# Patient Record
Sex: Male | Born: 1935
Health system: Southern US, Community
[De-identification: ages and names within clinical notes are randomized; demographics above are authoritative.]

## PROBLEM LIST (undated history)

## (undated) DIAGNOSIS — C4490 Unspecified malignant neoplasm of skin, unspecified: Secondary | ICD-10-CM

## (undated) DIAGNOSIS — N400 Enlarged prostate without lower urinary tract symptoms: Secondary | ICD-10-CM

## (undated) DIAGNOSIS — N289 Disorder of kidney and ureter, unspecified: Secondary | ICD-10-CM

## (undated) DIAGNOSIS — K219 Gastro-esophageal reflux disease without esophagitis: Secondary | ICD-10-CM

## (undated) DIAGNOSIS — M1990 Unspecified osteoarthritis, unspecified site: Secondary | ICD-10-CM

## (undated) DIAGNOSIS — N529 Male erectile dysfunction, unspecified: Secondary | ICD-10-CM

## (undated) DIAGNOSIS — C801 Malignant (primary) neoplasm, unspecified: Secondary | ICD-10-CM

## (undated) DIAGNOSIS — N189 Chronic kidney disease, unspecified: Secondary | ICD-10-CM

## (undated) DIAGNOSIS — R7301 Impaired fasting glucose: Secondary | ICD-10-CM

## (undated) DIAGNOSIS — Z8701 Personal history of pneumonia (recurrent): Secondary | ICD-10-CM

## (undated) DIAGNOSIS — T7840XA Allergy, unspecified, initial encounter: Secondary | ICD-10-CM

## (undated) DIAGNOSIS — C61 Malignant neoplasm of prostate: Secondary | ICD-10-CM

## (undated) HISTORY — DX: Gastro-esophageal reflux disease without esophagitis: K21.9

## (undated) HISTORY — PX: OTHER SURGICAL HISTORY: SHX169

## (undated) HISTORY — DX: Benign prostatic hyperplasia without lower urinary tract symptoms: N40.0

## (undated) HISTORY — DX: Impaired fasting glucose: R73.01

## (undated) HISTORY — DX: Unspecified malignant neoplasm of skin, unspecified: C44.90

## (undated) HISTORY — DX: Male erectile dysfunction, unspecified: N52.9

## (undated) HISTORY — DX: Disorder of kidney and ureter, unspecified: N28.9

## (undated) HISTORY — DX: Allergy, unspecified, initial encounter: T78.40XA

## (undated) HISTORY — DX: Chronic kidney disease, unspecified: N18.9

## (undated) HISTORY — DX: Personal history of pneumonia (recurrent): Z87.01

## (undated) HISTORY — DX: Malignant neoplasm of prostate: C61

## (undated) HISTORY — DX: Unspecified osteoarthritis, unspecified site: M19.90

---

## 2005-08-31 ENCOUNTER — Ambulatory Visit (HOSPITAL_COMMUNITY): Admission: RE | Admit: 2005-08-31 | Discharge: 2005-08-31 | Payer: Self-pay | Admitting: Urology

## 2005-09-29 ENCOUNTER — Ambulatory Visit: Admission: RE | Admit: 2005-09-29 | Discharge: 2005-12-28 | Payer: Self-pay | Admitting: Radiation Oncology

## 2005-12-07 ENCOUNTER — Encounter: Admission: RE | Admit: 2005-12-07 | Discharge: 2005-12-07 | Payer: Self-pay | Admitting: Urology

## 2005-12-14 ENCOUNTER — Ambulatory Visit (HOSPITAL_BASED_OUTPATIENT_CLINIC_OR_DEPARTMENT_OTHER): Admission: RE | Admit: 2005-12-14 | Discharge: 2005-12-14 | Payer: Self-pay | Admitting: Urology

## 2005-12-31 ENCOUNTER — Ambulatory Visit: Admission: RE | Admit: 2005-12-31 | Discharge: 2006-02-24 | Payer: Self-pay | Admitting: Radiation Oncology

## 2009-05-16 ENCOUNTER — Ambulatory Visit: Payer: Self-pay | Admitting: Urology

## 2010-10-22 ENCOUNTER — Ambulatory Visit: Payer: Self-pay | Admitting: Urology

## 2010-10-22 ENCOUNTER — Ambulatory Visit (INDEPENDENT_AMBULATORY_CARE_PROVIDER_SITE_OTHER): Payer: Medicare Other | Admitting: Urology

## 2010-10-22 DIAGNOSIS — C61 Malignant neoplasm of prostate: Secondary | ICD-10-CM

## 2010-10-22 DIAGNOSIS — N400 Enlarged prostate without lower urinary tract symptoms: Secondary | ICD-10-CM

## 2011-04-21 ENCOUNTER — Ambulatory Visit (INDEPENDENT_AMBULATORY_CARE_PROVIDER_SITE_OTHER): Payer: Medicare Other | Admitting: Urology

## 2011-04-21 DIAGNOSIS — C61 Malignant neoplasm of prostate: Secondary | ICD-10-CM

## 2011-05-29 ENCOUNTER — Other Ambulatory Visit: Payer: Self-pay | Admitting: Urology

## 2011-05-29 DIAGNOSIS — C61 Malignant neoplasm of prostate: Secondary | ICD-10-CM

## 2011-06-04 ENCOUNTER — Encounter (HOSPITAL_COMMUNITY): Payer: Self-pay

## 2011-06-04 ENCOUNTER — Ambulatory Visit (HOSPITAL_COMMUNITY)
Admission: RE | Admit: 2011-06-04 | Discharge: 2011-06-04 | Disposition: A | Discharge status: Home / Self Care | Payer: Medicare Other | Source: Ambulatory Visit | Attending: Urology | Admitting: Urology

## 2011-06-04 ENCOUNTER — Encounter (HOSPITAL_COMMUNITY)
Admission: RE | Admit: 2011-06-04 | Discharge: 2011-06-04 | Disposition: A | Discharge status: Home / Self Care | Payer: Medicare Other | Source: Ambulatory Visit | Attending: Urology | Admitting: Urology

## 2011-06-04 DIAGNOSIS — R930 Abnormal findings on diagnostic imaging of skull and head, not elsewhere classified: Secondary | ICD-10-CM | POA: Insufficient documentation

## 2011-06-04 DIAGNOSIS — C61 Malignant neoplasm of prostate: Secondary | ICD-10-CM | POA: Insufficient documentation

## 2011-06-04 DIAGNOSIS — Q619 Cystic kidney disease, unspecified: Secondary | ICD-10-CM | POA: Insufficient documentation

## 2011-06-04 HISTORY — DX: Malignant (primary) neoplasm, unspecified: C80.1

## 2011-06-04 MED ORDER — TECHNETIUM TC 99M MEDRONATE IV KIT
25.0000 | PACK | Freq: Once | INTRAVENOUS | Status: AC | PRN
  Administered 2011-06-04: 27 via INTRAVENOUS

## 2011-06-04 MED ORDER — IOHEXOL 300 MG/ML  SOLN
100.0000 mL | Freq: Once | INTRAMUSCULAR | Status: AC | PRN
  Administered 2011-06-04: 100 mL via INTRAVENOUS

## 2011-06-12 ENCOUNTER — Other Ambulatory Visit: Payer: Self-pay | Admitting: Urology

## 2011-06-12 ENCOUNTER — Other Ambulatory Visit (HOSPITAL_COMMUNITY): Payer: Medicare Other

## 2011-06-12 DIAGNOSIS — C61 Malignant neoplasm of prostate: Secondary | ICD-10-CM

## 2011-06-19 ENCOUNTER — Ambulatory Visit (HOSPITAL_COMMUNITY)
Admission: RE | Admit: 2011-06-19 | Discharge: 2011-06-19 | Disposition: A | Discharge status: Home / Self Care | Payer: Medicare Other | Source: Ambulatory Visit | Attending: Urology | Admitting: Urology

## 2011-06-19 DIAGNOSIS — M799 Soft tissue disorder, unspecified: Secondary | ICD-10-CM | POA: Insufficient documentation

## 2011-06-19 DIAGNOSIS — C61 Malignant neoplasm of prostate: Secondary | ICD-10-CM | POA: Diagnosis not present

## 2011-06-19 DIAGNOSIS — R930 Abnormal findings on diagnostic imaging of skull and head, not elsewhere classified: Secondary | ICD-10-CM | POA: Insufficient documentation

## 2011-06-24 ENCOUNTER — Other Ambulatory Visit: Payer: Self-pay | Admitting: Internal Medicine

## 2011-06-24 DIAGNOSIS — D329 Benign neoplasm of meninges, unspecified: Secondary | ICD-10-CM

## 2011-06-28 ENCOUNTER — Ambulatory Visit
Admission: RE | Admit: 2011-06-28 | Discharge: 2011-06-28 | Disposition: A | Discharge status: Home / Self Care | Payer: Medicare Other | Source: Ambulatory Visit | Attending: Internal Medicine | Admitting: Internal Medicine

## 2011-06-28 DIAGNOSIS — M799 Soft tissue disorder, unspecified: Secondary | ICD-10-CM | POA: Diagnosis not present

## 2011-06-28 DIAGNOSIS — Z8546 Personal history of malignant neoplasm of prostate: Secondary | ICD-10-CM | POA: Diagnosis not present

## 2011-06-28 DIAGNOSIS — D329 Benign neoplasm of meninges, unspecified: Secondary | ICD-10-CM

## 2011-06-28 DIAGNOSIS — C7952 Secondary malignant neoplasm of bone marrow: Secondary | ICD-10-CM | POA: Diagnosis not present

## 2011-07-01 ENCOUNTER — Encounter: Payer: Self-pay | Admitting: Radiation Oncology

## 2011-07-01 NOTE — Progress Notes (Signed)
78 year old male. Married to wife, Britta Mccreedy. 3 children, 2 daughters and 1 son. Grandchildren (one murdered) Retired Doctor, general practice.  Completed external beam radiation therapy 11/23/2005 4500 cGy 25 sessions for clinical stage T2b, intermediate risk adenocarcinoma of the prostate. Implanted  9000 cGy, isotope iodine 125 utilizing 57 seeds and 20 needles with a seed activity of 0.345 millicuries per seed on 12/14/2005.   Dr. Retta Diones ordered CT and bone scan because PSA continued to rise. Bone scan done 06/04/2011 which revealed abnormal uptake in the left skull. CT of head done 06/18/2010 revealed 1.5 cm left frontal extra-axial soft tissue mass , most consistent with a small meningioma. Then, 06/28/2011 MRI revealed high left frontal bone metastasis approximately 4 to 4.5 cm in diameter.  No indication of a pacemaker.  WU:[id number] HISTORY OF RADIATION THERAPY

## 2011-07-02 ENCOUNTER — Ambulatory Visit
Admission: RE | Admit: 2011-07-02 | Discharge: 2011-07-02 | Disposition: A | Discharge status: Home / Self Care | Payer: Medicare Other | Source: Ambulatory Visit | Attending: Radiation Oncology | Admitting: Radiation Oncology

## 2011-07-02 ENCOUNTER — Encounter: Payer: Self-pay | Admitting: Radiation Oncology

## 2011-07-02 DIAGNOSIS — Z87891 Personal history of nicotine dependence: Secondary | ICD-10-CM | POA: Diagnosis not present

## 2011-07-02 DIAGNOSIS — Z510 Encounter for antineoplastic radiation therapy: Secondary | ICD-10-CM | POA: Insufficient documentation

## 2011-07-02 DIAGNOSIS — K219 Gastro-esophageal reflux disease without esophagitis: Secondary | ICD-10-CM | POA: Diagnosis not present

## 2011-07-02 DIAGNOSIS — Z79899 Other long term (current) drug therapy: Secondary | ICD-10-CM | POA: Diagnosis not present

## 2011-07-02 DIAGNOSIS — C7952 Secondary malignant neoplasm of bone marrow: Secondary | ICD-10-CM | POA: Insufficient documentation

## 2011-07-02 DIAGNOSIS — Z809 Family history of malignant neoplasm, unspecified: Secondary | ICD-10-CM | POA: Insufficient documentation

## 2011-07-02 DIAGNOSIS — C7951 Secondary malignant neoplasm of bone: Secondary | ICD-10-CM | POA: Insufficient documentation

## 2011-07-02 DIAGNOSIS — C61 Malignant neoplasm of prostate: Secondary | ICD-10-CM | POA: Insufficient documentation

## 2011-07-02 DIAGNOSIS — N189 Chronic kidney disease, unspecified: Secondary | ICD-10-CM | POA: Insufficient documentation

## 2011-07-02 DIAGNOSIS — Z85828 Personal history of other malignant neoplasm of skin: Secondary | ICD-10-CM | POA: Diagnosis not present

## 2011-07-02 NOTE — Progress Notes (Signed)
Please see the Nurse Progress Note in the MD Initial Consult Encounter for this patient. 

## 2011-07-02 NOTE — Progress Notes (Addendum)
[facility] Radiation Oncology NEW PATIENT EVALUATION  Name: Eric Deleon MRN: [id number]  Date: 07/02/2011  DOB: 25-May-1932  Status: outpatient   CC: Ezequiel Kayser, MD, MD  Waynard Edwards Redge Gainer, MD    REFERRING PHYSICIAN: Ezequiel Kayser, MD   DIAGNOSIS: Metastatic carcinoma of prostate to left frontal bone    HISTORY OF PRESENT ILLNESS:  Eric Deleon is a 78 y.o. male who is seen today for the courtesy of Dr. Waynard Edwards for consideration of radiation therapy in the management of his metastatic carcinoma the prostate to the left frontal bone/calvarium. I first saw the patient in consultation in April 2007 when he presented with stage TII B. intermediate risk adenocarcinoma prostate. He was treated with external beam radiation therapy followed by seed implantation. Unfortunately, he presented have a rising PSA which was 1.15 in April 2009. But 2000 cGy to 6.5. His staging workup including a bone scan was negative. His post on Casodex in December 2000 (50 mg every other day) he had a fall his PSA in April 2012 his PSA return to 0.8. A repeat PSA at Dr. Laurey Morale office on 12/03/2010 was 8.24. He was seen by Dr. Retta Diones on 04/21/2011. His PSA in 04/21/2011 from Dr. Lenoria Chime office was 9.85.   PREVIOUS RADIATION THERAPY: Status post 4500 cGy in 25 sessions directed to his prostate completed 11/23/2005. Prostate seed implant to deliver a further 9000 cGy performed on 12/14/2005.   PAST MEDICAL HISTORY:  has a past medical history of Prostate cancer; GERD (gastroesophageal reflux disease); Gout; Cancer; Arthritis; History of pneumonia; Erectile dysfunction; CRI (chronic renal insufficiency); Skin cancer; BPH (benign prostatic hyperplasia); IFG (impaired fasting glucose); and Allergy.     PAST SURGICAL HISTORY:  Past Surgical History  Procedure Date  . History of radiation therapy   . Skin cancer excised     Multiple skin cancers excised from hands and back     FAMILY HISTORY: family  history includes COPD in his mother; Cancer in his brother and father; Heart attack in his father; and Heart disease in his mother.   SOCIAL HISTORY:  reports that he quit smoking about 51 years ago. His smoking use included Cigarettes. He has a 7.5 pack-year smoking history. He has never used smokeless tobacco. He reports that he drinks alcohol. He reports that he does not use illicit drugs.   ALLERGIES: Antihistamines, chlorpheniramine-type   MEDICATIONS:  Current Outpatient Prescriptions  Medication Sig Dispense Refill  . allopurinol (ZYLOPRIM) 100 MG tablet Take 100 mg by mouth daily.      . bicalutamide (CASODEX) 50 MG tablet Take 50 mg by mouth daily.      . diphenhydrAMINE (BENADRYL) 25 MG tablet Take 25 mg by mouth every 6 (six) hours as needed.      Marland Kitchen omeprazole (PRILOSEC) 20 MG capsule Take 20 mg by mouth daily.      . simethicone (MYLICON) 80 MG chewable tablet Chew 80 mg by mouth every 6 (six) hours as needed.      . Tamsulosin HCl (FLOMAX) 0.4 MG CAPS Take by mouth.      . Vitamin D, Cholecalciferol, 400 UNITS CHEW Chew 800 Units by mouth daily.      Marland Kitchen aspirin 81 MG tablet Take 81 mg by mouth daily.          REVIEW OF SYSTEMS:  Pertinent items are noted in HPI.    PHYSICAL EXAM:  height is 5\' 11"  (1.803 m) and weight is 234 lb 4.8 oz (106.278  kg). His oral temperature is 98.2 F (36.8 C). His blood pressure is 106/67 and his pulse is 93. His respiration is 20.   Head and neck examination: His calvarium is normal to palpation. No masses are appreciated. Remainder of his head and neck examination is grossly unremarkable. Neurologic examination: Grossly nonfocal   LABORATORY DATA:  No results found for this basename: WBC, HGB, HCT, MCV, PLT   No results found for this basename: NA, K, CL, CO2   No results found for this basename: ALT, AST, GGT, ALKPHOS, BILITOT   04/21/2011 PSA 9.85.    IMPRESSION: Presumed metastatic carcinoma of prostate to bone. I explained to  the family that his management options include a change in his hormone therapy, local external beam radiation therapy, possible radiosurgery, possible surgical resection, or even observation. The patient is most concerned about his quality of life which would be altered by total androgen deprivation. I have a call into Dr. Retta Diones to discuss his hormone therapy options. I spoke with Dr. Kathrynn Running, and our medical Dir. of radiosurgery and he can have the patient presented at the radiosurgery conference on January 28. It is tempting to be more aggressive for what appears to be a solitary bone metastasis, but he will almost certainly develop other sites of metastatic disease to bone which can be delayed with any deprivation therapy. Dr. Retta Diones may want to meet with Mr. Payson before he decides on a course of action. I told the patient that I would be back in touch with him after he is presented at the radiosurgery conference. Conventional external beam radiation therapy would be well-tolerated, but would result in temporary or permanent alopecia along the frontal scalp.   PLAN: As discussed above.   I spent 60 minutes minutes face to face with the patient and more than 50% of that time was spent in counseling and/or coordination of care.

## 2011-07-02 NOTE — Progress Notes (Signed)
Encounter addended by: Maryln Gottron, MD on: 07/02/2011  4:46 PM<BR>     Documentation filed: Notes Section

## 2011-07-03 NOTE — Progress Notes (Signed)
Encounter addended by: Lowella Petties, RN on: 07/03/2011 10:55 AM<BR>     Documentation filed: Charges VN

## 2011-07-03 NOTE — Progress Notes (Signed)
Encounter addended by: Ardell Isaacs, RN on: 07/03/2011 11:38 AM<BR>     Documentation filed: Charges VN, Inpatient Patient Education

## 2011-07-15 DIAGNOSIS — D075 Carcinoma in situ of prostate: Secondary | ICD-10-CM | POA: Diagnosis not present

## 2011-07-16 ENCOUNTER — Encounter: Payer: Self-pay | Admitting: Radiation Oncology

## 2011-07-16 NOTE — Progress Notes (Signed)
Chart note: I spoke with Mr. Eric Deleon this morning. He met with Dr. Venetia Maxon of neurosurgery. He is not interested in surgical resection. I spoke with Dr. Retta Diones earlier this week. Mr. Eric Deleon would like to delay total androgen deprivation therapy to maintain quality of life as long as possible. Our plan is to proceed with a two-week course of radiation therapy to his calvarium. He is not concerned about alopecia which may be permanent. He will visit [place] for treatment planning early next week.

## 2011-07-21 ENCOUNTER — Ambulatory Visit
Admission: RE | Admit: 2011-07-21 | Discharge: 2011-07-21 | Disposition: A | Discharge status: Home / Self Care | Payer: Medicare Other | Source: Ambulatory Visit | Attending: Radiation Oncology | Admitting: Radiation Oncology

## 2011-07-21 DIAGNOSIS — C7951 Secondary malignant neoplasm of bone: Secondary | ICD-10-CM

## 2011-07-21 DIAGNOSIS — K219 Gastro-esophageal reflux disease without esophagitis: Secondary | ICD-10-CM | POA: Diagnosis not present

## 2011-07-21 DIAGNOSIS — N189 Chronic kidney disease, unspecified: Secondary | ICD-10-CM | POA: Diagnosis not present

## 2011-07-21 DIAGNOSIS — Z510 Encounter for antineoplastic radiation therapy: Secondary | ICD-10-CM | POA: Diagnosis not present

## 2011-07-21 DIAGNOSIS — Z85828 Personal history of other malignant neoplasm of skin: Secondary | ICD-10-CM | POA: Diagnosis not present

## 2011-07-21 DIAGNOSIS — C61 Malignant neoplasm of prostate: Secondary | ICD-10-CM | POA: Diagnosis not present

## 2011-07-21 DIAGNOSIS — C7952 Secondary malignant neoplasm of bone marrow: Secondary | ICD-10-CM | POA: Diagnosis not present

## 2011-07-21 NOTE — Progress Notes (Signed)
Met with patient to discuss RO billing. Pt had some fin concerns and advice he will let me know if need addl asst.  Rad Tx: [id number]  Attending Rad: Dr. Dayton Scrape   Dx: 198.5 Bone and bone marrow

## 2011-07-22 NOTE — Progress Notes (Signed)
Simulation/treatment planning note: Eric Deleon underwent simulation/treatment planning beginning on 07/21/2011. A head cast was constructed for immobilization. His head was scanned. I chose an arbitrary isocenter based on his MRI scan. I ordered fusion of his MRI scan with his treatment planning CT scan. His tumor including marrow involvement was contoured on [place]. He is setup to AP, PA, left lateral, and right lateral fields with 4 separate and unique multileaf collimators. The purpose of a 4 field set up was to prevent extensive alopecia. Dosimetry in an isodose plan and requested.

## 2011-07-23 DIAGNOSIS — Z510 Encounter for antineoplastic radiation therapy: Secondary | ICD-10-CM | POA: Diagnosis not present

## 2011-07-23 DIAGNOSIS — Z85828 Personal history of other malignant neoplasm of skin: Secondary | ICD-10-CM | POA: Diagnosis not present

## 2011-07-23 DIAGNOSIS — C7951 Secondary malignant neoplasm of bone: Secondary | ICD-10-CM | POA: Diagnosis not present

## 2011-07-23 DIAGNOSIS — N189 Chronic kidney disease, unspecified: Secondary | ICD-10-CM | POA: Diagnosis not present

## 2011-07-23 DIAGNOSIS — C61 Malignant neoplasm of prostate: Secondary | ICD-10-CM | POA: Diagnosis not present

## 2011-07-23 DIAGNOSIS — K219 Gastro-esophageal reflux disease without esophagitis: Secondary | ICD-10-CM | POA: Diagnosis not present

## 2011-07-24 DIAGNOSIS — C7951 Secondary malignant neoplasm of bone: Secondary | ICD-10-CM | POA: Diagnosis not present

## 2011-07-28 ENCOUNTER — Ambulatory Visit
Admission: RE | Admit: 2011-07-28 | Discharge: 2011-07-28 | Disposition: A | Discharge status: Home / Self Care | Payer: Medicare Other | Source: Ambulatory Visit | Attending: Radiation Oncology | Admitting: Radiation Oncology

## 2011-07-28 ENCOUNTER — Encounter: Payer: Self-pay | Admitting: Radiation Oncology

## 2011-07-28 DIAGNOSIS — C61 Malignant neoplasm of prostate: Secondary | ICD-10-CM | POA: Diagnosis not present

## 2011-07-28 DIAGNOSIS — K219 Gastro-esophageal reflux disease without esophagitis: Secondary | ICD-10-CM | POA: Diagnosis not present

## 2011-07-28 DIAGNOSIS — Z85828 Personal history of other malignant neoplasm of skin: Secondary | ICD-10-CM | POA: Diagnosis not present

## 2011-07-28 DIAGNOSIS — Z510 Encounter for antineoplastic radiation therapy: Secondary | ICD-10-CM | POA: Diagnosis not present

## 2011-07-28 DIAGNOSIS — N189 Chronic kidney disease, unspecified: Secondary | ICD-10-CM | POA: Diagnosis not present

## 2011-07-28 DIAGNOSIS — C7951 Secondary malignant neoplasm of bone: Secondary | ICD-10-CM | POA: Diagnosis not present

## 2011-07-28 NOTE — Progress Notes (Signed)
Simulation Verification Note:  Mr. Kaucher underwent simulation verification today for his treatment to the calvarium. His isocenter needs to be moved superiorly 0.4-0.5 cm. The MLCs contour the intended treatment volume appropriately.

## 2011-07-29 ENCOUNTER — Ambulatory Visit
Admission: RE | Admit: 2011-07-29 | Discharge: 2011-07-29 | Disposition: A | Discharge status: Home / Self Care | Payer: Medicare Other | Source: Ambulatory Visit | Attending: Radiation Oncology | Admitting: Radiation Oncology

## 2011-07-29 DIAGNOSIS — K219 Gastro-esophageal reflux disease without esophagitis: Secondary | ICD-10-CM | POA: Diagnosis not present

## 2011-07-29 DIAGNOSIS — C7952 Secondary malignant neoplasm of bone marrow: Secondary | ICD-10-CM | POA: Diagnosis not present

## 2011-07-29 DIAGNOSIS — C7951 Secondary malignant neoplasm of bone: Secondary | ICD-10-CM | POA: Diagnosis not present

## 2011-07-29 DIAGNOSIS — N189 Chronic kidney disease, unspecified: Secondary | ICD-10-CM | POA: Diagnosis not present

## 2011-07-29 DIAGNOSIS — C61 Malignant neoplasm of prostate: Secondary | ICD-10-CM | POA: Diagnosis not present

## 2011-07-29 DIAGNOSIS — Z85828 Personal history of other malignant neoplasm of skin: Secondary | ICD-10-CM | POA: Diagnosis not present

## 2011-07-29 DIAGNOSIS — Z510 Encounter for antineoplastic radiation therapy: Secondary | ICD-10-CM | POA: Diagnosis not present

## 2011-07-30 ENCOUNTER — Ambulatory Visit
Admission: RE | Admit: 2011-07-30 | Discharge: 2011-07-30 | Disposition: A | Discharge status: Home / Self Care | Payer: Medicare Other | Source: Ambulatory Visit | Attending: Radiation Oncology | Admitting: Radiation Oncology

## 2011-07-30 DIAGNOSIS — C7952 Secondary malignant neoplasm of bone marrow: Secondary | ICD-10-CM | POA: Diagnosis not present

## 2011-07-30 DIAGNOSIS — K219 Gastro-esophageal reflux disease without esophagitis: Secondary | ICD-10-CM | POA: Diagnosis not present

## 2011-07-30 DIAGNOSIS — Z85828 Personal history of other malignant neoplasm of skin: Secondary | ICD-10-CM | POA: Diagnosis not present

## 2011-07-30 DIAGNOSIS — N189 Chronic kidney disease, unspecified: Secondary | ICD-10-CM | POA: Diagnosis not present

## 2011-07-30 DIAGNOSIS — Z510 Encounter for antineoplastic radiation therapy: Secondary | ICD-10-CM | POA: Diagnosis not present

## 2011-07-30 DIAGNOSIS — C61 Malignant neoplasm of prostate: Secondary | ICD-10-CM | POA: Diagnosis not present

## 2011-07-31 ENCOUNTER — Ambulatory Visit
Admission: RE | Admit: 2011-07-31 | Discharge: 2011-07-31 | Disposition: A | Discharge status: Home / Self Care | Payer: Medicare Other | Source: Ambulatory Visit | Attending: Radiation Oncology | Admitting: Radiation Oncology

## 2011-07-31 DIAGNOSIS — C61 Malignant neoplasm of prostate: Secondary | ICD-10-CM | POA: Diagnosis not present

## 2011-07-31 DIAGNOSIS — Z85828 Personal history of other malignant neoplasm of skin: Secondary | ICD-10-CM | POA: Diagnosis not present

## 2011-07-31 DIAGNOSIS — K219 Gastro-esophageal reflux disease without esophagitis: Secondary | ICD-10-CM | POA: Diagnosis not present

## 2011-07-31 DIAGNOSIS — C7951 Secondary malignant neoplasm of bone: Secondary | ICD-10-CM | POA: Diagnosis not present

## 2011-07-31 DIAGNOSIS — Z510 Encounter for antineoplastic radiation therapy: Secondary | ICD-10-CM | POA: Diagnosis not present

## 2011-07-31 DIAGNOSIS — N189 Chronic kidney disease, unspecified: Secondary | ICD-10-CM | POA: Diagnosis not present

## 2011-08-03 ENCOUNTER — Encounter: Payer: Self-pay | Admitting: Radiation Oncology

## 2011-08-03 ENCOUNTER — Ambulatory Visit
Admission: RE | Admit: 2011-08-03 | Discharge: 2011-08-03 | Disposition: A | Discharge status: Home / Self Care | Payer: Medicare Other | Source: Ambulatory Visit | Attending: Radiation Oncology | Admitting: Radiation Oncology

## 2011-08-03 VITALS — BP 142/81 | HR 87 | Resp 18 | Wt 229.1 lb

## 2011-08-03 DIAGNOSIS — C61 Malignant neoplasm of prostate: Secondary | ICD-10-CM | POA: Diagnosis not present

## 2011-08-03 DIAGNOSIS — N189 Chronic kidney disease, unspecified: Secondary | ICD-10-CM | POA: Diagnosis not present

## 2011-08-03 DIAGNOSIS — C7951 Secondary malignant neoplasm of bone: Secondary | ICD-10-CM

## 2011-08-03 DIAGNOSIS — Z510 Encounter for antineoplastic radiation therapy: Secondary | ICD-10-CM | POA: Diagnosis not present

## 2011-08-03 DIAGNOSIS — Z85828 Personal history of other malignant neoplasm of skin: Secondary | ICD-10-CM | POA: Diagnosis not present

## 2011-08-03 DIAGNOSIS — K219 Gastro-esophageal reflux disease without esophagitis: Secondary | ICD-10-CM | POA: Diagnosis not present

## 2011-08-03 NOTE — Progress Notes (Signed)
Weekly Management Note:  Site:Frontal calvarium Current Dose:  1200  cGy Projected Dose: 3000  cGy  Narrative: The patient is seen today for routine under treatment assessment. CBCT/MVCT images/port films were reviewed. The chart was reviewed.   No complaints today.  Physical Examination:  Filed Vitals:   08/03/11 1339  BP: 142/81  Pulse: 87  Resp: 18  .  Weight: 229 lb 1.6 oz (103.919 kg). No change, no alopecia.  Impression: Tolerating radiation therapy well.  Plan: Continue radiation therapy as planned.

## 2011-08-03 NOTE — Progress Notes (Signed)
Patient presents to the clinic today accompanied by a family member for an under treat visit with Dr. Dayton Scrape. Patient is alert and oriented to person, place, and time. No distress noted. Steady gait noted. Pleasant affect noted. Patient has no complaints at this time. Patient denies pain. Reported all findings to Dr. Dayton Scrape.

## 2011-08-04 ENCOUNTER — Ambulatory Visit
Admission: RE | Admit: 2011-08-04 | Discharge: 2011-08-04 | Disposition: A | Discharge status: Home / Self Care | Payer: Medicare Other | Source: Ambulatory Visit | Attending: Radiation Oncology | Admitting: Radiation Oncology

## 2011-08-04 DIAGNOSIS — Z510 Encounter for antineoplastic radiation therapy: Secondary | ICD-10-CM | POA: Diagnosis not present

## 2011-08-04 DIAGNOSIS — C61 Malignant neoplasm of prostate: Secondary | ICD-10-CM | POA: Diagnosis not present

## 2011-08-04 DIAGNOSIS — C7952 Secondary malignant neoplasm of bone marrow: Secondary | ICD-10-CM | POA: Diagnosis not present

## 2011-08-04 DIAGNOSIS — N189 Chronic kidney disease, unspecified: Secondary | ICD-10-CM | POA: Diagnosis not present

## 2011-08-04 DIAGNOSIS — K219 Gastro-esophageal reflux disease without esophagitis: Secondary | ICD-10-CM | POA: Diagnosis not present

## 2011-08-04 DIAGNOSIS — Z85828 Personal history of other malignant neoplasm of skin: Secondary | ICD-10-CM | POA: Diagnosis not present

## 2011-08-05 ENCOUNTER — Ambulatory Visit
Admission: RE | Admit: 2011-08-05 | Discharge: 2011-08-05 | Disposition: A | Discharge status: Home / Self Care | Payer: Medicare Other | Source: Ambulatory Visit | Attending: Radiation Oncology | Admitting: Radiation Oncology

## 2011-08-05 DIAGNOSIS — C61 Malignant neoplasm of prostate: Secondary | ICD-10-CM | POA: Diagnosis not present

## 2011-08-05 DIAGNOSIS — N189 Chronic kidney disease, unspecified: Secondary | ICD-10-CM | POA: Diagnosis not present

## 2011-08-05 DIAGNOSIS — Z510 Encounter for antineoplastic radiation therapy: Secondary | ICD-10-CM | POA: Diagnosis not present

## 2011-08-05 DIAGNOSIS — C7951 Secondary malignant neoplasm of bone: Secondary | ICD-10-CM | POA: Diagnosis not present

## 2011-08-05 DIAGNOSIS — K219 Gastro-esophageal reflux disease without esophagitis: Secondary | ICD-10-CM | POA: Diagnosis not present

## 2011-08-05 DIAGNOSIS — Z85828 Personal history of other malignant neoplasm of skin: Secondary | ICD-10-CM | POA: Diagnosis not present

## 2011-08-05 DIAGNOSIS — C7952 Secondary malignant neoplasm of bone marrow: Secondary | ICD-10-CM | POA: Diagnosis not present

## 2011-08-06 ENCOUNTER — Ambulatory Visit
Admission: RE | Admit: 2011-08-06 | Discharge: 2011-08-06 | Disposition: A | Discharge status: Home / Self Care | Payer: Medicare Other | Source: Ambulatory Visit | Attending: Radiation Oncology | Admitting: Radiation Oncology

## 2011-08-06 DIAGNOSIS — Z85828 Personal history of other malignant neoplasm of skin: Secondary | ICD-10-CM | POA: Diagnosis not present

## 2011-08-06 DIAGNOSIS — Z510 Encounter for antineoplastic radiation therapy: Secondary | ICD-10-CM | POA: Diagnosis not present

## 2011-08-06 DIAGNOSIS — K219 Gastro-esophageal reflux disease without esophagitis: Secondary | ICD-10-CM | POA: Diagnosis not present

## 2011-08-06 DIAGNOSIS — C7952 Secondary malignant neoplasm of bone marrow: Secondary | ICD-10-CM | POA: Diagnosis not present

## 2011-08-06 DIAGNOSIS — C61 Malignant neoplasm of prostate: Secondary | ICD-10-CM | POA: Diagnosis not present

## 2011-08-06 DIAGNOSIS — N189 Chronic kidney disease, unspecified: Secondary | ICD-10-CM | POA: Diagnosis not present

## 2011-08-07 ENCOUNTER — Ambulatory Visit
Admission: RE | Admit: 2011-08-07 | Discharge: 2011-08-07 | Disposition: A | Discharge status: Home / Self Care | Payer: Medicare Other | Source: Ambulatory Visit | Attending: Radiation Oncology | Admitting: Radiation Oncology

## 2011-08-07 DIAGNOSIS — Z85828 Personal history of other malignant neoplasm of skin: Secondary | ICD-10-CM | POA: Diagnosis not present

## 2011-08-07 DIAGNOSIS — Z510 Encounter for antineoplastic radiation therapy: Secondary | ICD-10-CM | POA: Diagnosis not present

## 2011-08-07 DIAGNOSIS — C61 Malignant neoplasm of prostate: Secondary | ICD-10-CM | POA: Diagnosis not present

## 2011-08-07 DIAGNOSIS — N189 Chronic kidney disease, unspecified: Secondary | ICD-10-CM | POA: Diagnosis not present

## 2011-08-07 DIAGNOSIS — C7951 Secondary malignant neoplasm of bone: Secondary | ICD-10-CM | POA: Diagnosis not present

## 2011-08-07 DIAGNOSIS — K219 Gastro-esophageal reflux disease without esophagitis: Secondary | ICD-10-CM | POA: Diagnosis not present

## 2011-08-10 ENCOUNTER — Ambulatory Visit
Admission: RE | Admit: 2011-08-10 | Discharge: 2011-08-10 | Disposition: A | Discharge status: Home / Self Care | Payer: Medicare Other | Source: Ambulatory Visit | Attending: Radiation Oncology | Admitting: Radiation Oncology

## 2011-08-10 ENCOUNTER — Encounter: Payer: Self-pay | Admitting: Radiation Oncology

## 2011-08-10 VITALS — BP 131/82 | HR 79 | Resp 18 | Wt 234.0 lb

## 2011-08-10 DIAGNOSIS — C7952 Secondary malignant neoplasm of bone marrow: Secondary | ICD-10-CM | POA: Diagnosis not present

## 2011-08-10 DIAGNOSIS — C7951 Secondary malignant neoplasm of bone: Secondary | ICD-10-CM

## 2011-08-10 DIAGNOSIS — Z510 Encounter for antineoplastic radiation therapy: Secondary | ICD-10-CM | POA: Diagnosis not present

## 2011-08-10 DIAGNOSIS — N189 Chronic kidney disease, unspecified: Secondary | ICD-10-CM | POA: Diagnosis not present

## 2011-08-10 DIAGNOSIS — K219 Gastro-esophageal reflux disease without esophagitis: Secondary | ICD-10-CM | POA: Diagnosis not present

## 2011-08-10 DIAGNOSIS — C61 Malignant neoplasm of prostate: Secondary | ICD-10-CM | POA: Diagnosis not present

## 2011-08-10 DIAGNOSIS — Z85828 Personal history of other malignant neoplasm of skin: Secondary | ICD-10-CM | POA: Diagnosis not present

## 2011-08-10 NOTE — Progress Notes (Signed)
Patient presents to the clinic today accompanied by his daughter for an under treat visit with Dr Dayton Scrape. Patient is alert and oriented to person, place, and time. No distress noted. Steady gait noted. Pleasant affect noted. Patient denies pain at this time. Tomorrow will be the patient's final treatment. Provided patient with an appointment card to return in one month for follow up. Patient denies hematuria or diarrhea. Patient denies burning with urination. Patient reports that on average he gets up 3 times per night to void. Reported all findings to Dr Dayton Scrape.

## 2011-08-10 NOTE — Progress Notes (Signed)
Weekly Management Note:  Site:Calvarium Current Dose:  2700  cGy Projected Dose: 3000  cGy  Narrative: The patient is seen today for routine under treatment assessment. CBCT/MVCT images/port films were reviewed. The chart was reviewed.   He is without complaints today.He continues with his Casodex.  Physical Examination:  Filed Vitals:   08/10/11 1324  BP: 131/82  Pulse: 79  Resp: 18  .  Weight: 234 lb (106.142 kg). There is no alopecia although there is slight erythema of the frontal scalp.  Impression: Tolerating radiation therapy well.  Plan: Continue radiation therapy as planned. He'll finish his radiation therapy tomorrow and return here for a one-month followup visit. He'll continue his Casodex.

## 2011-08-11 ENCOUNTER — Ambulatory Visit
Admission: RE | Admit: 2011-08-11 | Discharge: 2011-08-11 | Disposition: A | Discharge status: Home / Self Care | Payer: Medicare Other | Source: Ambulatory Visit | Attending: Radiation Oncology | Admitting: Radiation Oncology

## 2011-08-11 DIAGNOSIS — C7951 Secondary malignant neoplasm of bone: Secondary | ICD-10-CM | POA: Diagnosis not present

## 2011-08-11 DIAGNOSIS — C61 Malignant neoplasm of prostate: Secondary | ICD-10-CM | POA: Diagnosis not present

## 2011-08-11 DIAGNOSIS — K219 Gastro-esophageal reflux disease without esophagitis: Secondary | ICD-10-CM | POA: Diagnosis not present

## 2011-08-11 DIAGNOSIS — N189 Chronic kidney disease, unspecified: Secondary | ICD-10-CM | POA: Diagnosis not present

## 2011-08-11 DIAGNOSIS — Z510 Encounter for antineoplastic radiation therapy: Secondary | ICD-10-CM | POA: Diagnosis not present

## 2011-08-11 DIAGNOSIS — Z85828 Personal history of other malignant neoplasm of skin: Secondary | ICD-10-CM | POA: Diagnosis not present

## 2011-08-13 ENCOUNTER — Encounter: Payer: Self-pay | Admitting: Radiation Oncology

## 2011-08-13 NOTE — Progress Notes (Signed)
[facility] Radiation Oncology  Name:Eric Deleon  Date:08/13/2011           ZOX:[id number] DOB:05-24-1932   Status:outpatient    CC: Ezequiel Kayser, MD, MD  , Dr. Marcine Matar  REFERRING PHYSICIAN: Dr. Serita Grit  DIAGNOSIS:  Metastatic carcinoma prostate to the left frontal bone  INDICATION FOR TREATMENT: Palliative   TREATMENT DATES: There were 13 2013 through 08/11/2011                          SITE/DOSE:   Left frontal bone    3000 cGy 10 sessions                      BEAMS/ENERGY: 6 MV photons 4 field beam directed technique (AP, PA, right and left lateral)               NARRATIVE: The patient tolerated treatment well with no significant alopecia by completion of therapy.                            PLAN: Routine followup in one month. Patient instructed to call if questions or worsening complaints in interim.

## 2011-09-07 ENCOUNTER — Ambulatory Visit
Admission: RE | Admit: 2011-09-07 | Discharge: 2011-09-07 | Disposition: A | Discharge status: Home / Self Care | Payer: Medicare Other | Source: Ambulatory Visit | Attending: Radiation Oncology | Admitting: Radiation Oncology

## 2011-09-07 ENCOUNTER — Encounter: Payer: Self-pay | Admitting: Radiation Oncology

## 2011-09-07 VITALS — BP 120/72 | HR 88 | Temp 96.8°F | Resp 18 | Wt 234.9 lb

## 2011-09-07 DIAGNOSIS — C7951 Secondary malignant neoplasm of bone: Secondary | ICD-10-CM

## 2011-09-07 NOTE — Progress Notes (Signed)
Followup note:  Eric Deleon returns today approximately 1 month following completion of palliative radiotherapy to his left frontal bone in the management of his metastatic carcinoma prostate. He is without complaints today except for a 2 day history of right hip and low back discomfort which she relates to working in his chart and "hauling trash" . He tells me that he is doing more work around the house with his wife recovering from rotator cuff surgery.  Physical examination: There is alopecia along the right frontal scalp. The calvarium is normal to palpation.  Impression: Satisfactory progress. I reviewed his bone scan from December 20 and there is no evidence for metastatic disease to his lower back to account for his recent history of low back discomfort.  Plan: He'll continue his Casodex and maintain followup with Dr. Retta Diones.

## 2011-09-07 NOTE — Progress Notes (Signed)
Patient presents to the clinic today accompanied by his wife for a follow up appointment with Dr. Dayton Scrape. Patient is alert and oriented to person, place, and time. No distress noted. Steady gait noted. Pleasant affect noted. Patient denies headache, dizziness, nausea, vomiting or diarrhea. Patient denies diplopia.  Patient reports a "sore lower back and right hip for a couple of days." Patient reports eating and sleeping without difficulty. Patient reports he is scheduled to follow up with Dr. Retta Diones in May. Reported all findings to Dr. Dayton Scrape.

## 2011-10-20 ENCOUNTER — Ambulatory Visit (INDEPENDENT_AMBULATORY_CARE_PROVIDER_SITE_OTHER): Payer: Medicare Other | Admitting: Urology

## 2011-10-20 DIAGNOSIS — C61 Malignant neoplasm of prostate: Secondary | ICD-10-CM | POA: Diagnosis not present

## 2012-03-01 DIAGNOSIS — Z23 Encounter for immunization: Secondary | ICD-10-CM | POA: Diagnosis not present

## 2012-03-07 DIAGNOSIS — C801 Malignant (primary) neoplasm, unspecified: Secondary | ICD-10-CM | POA: Diagnosis not present

## 2012-03-07 DIAGNOSIS — Z125 Encounter for screening for malignant neoplasm of prostate: Secondary | ICD-10-CM | POA: Diagnosis not present

## 2012-03-07 DIAGNOSIS — N529 Male erectile dysfunction, unspecified: Secondary | ICD-10-CM | POA: Diagnosis not present

## 2012-03-07 DIAGNOSIS — M109 Gout, unspecified: Secondary | ICD-10-CM | POA: Diagnosis not present

## 2012-03-07 DIAGNOSIS — R7301 Impaired fasting glucose: Secondary | ICD-10-CM | POA: Diagnosis not present

## 2012-03-07 DIAGNOSIS — Z0000 Encounter for general adult medical examination without abnormal findings: Secondary | ICD-10-CM | POA: Diagnosis not present

## 2012-03-14 DIAGNOSIS — C801 Malignant (primary) neoplasm, unspecified: Secondary | ICD-10-CM | POA: Diagnosis not present

## 2012-03-14 DIAGNOSIS — R030 Elevated blood-pressure reading, without diagnosis of hypertension: Secondary | ICD-10-CM | POA: Diagnosis not present

## 2012-03-14 DIAGNOSIS — N189 Chronic kidney disease, unspecified: Secondary | ICD-10-CM | POA: Diagnosis not present

## 2012-03-14 DIAGNOSIS — Z0000 Encounter for general adult medical examination without abnormal findings: Secondary | ICD-10-CM | POA: Diagnosis not present

## 2012-03-15 DIAGNOSIS — Z1212 Encounter for screening for malignant neoplasm of rectum: Secondary | ICD-10-CM | POA: Diagnosis not present

## 2012-03-16 DIAGNOSIS — D235 Other benign neoplasm of skin of trunk: Secondary | ICD-10-CM | POA: Diagnosis not present

## 2012-03-16 DIAGNOSIS — L570 Actinic keratosis: Secondary | ICD-10-CM | POA: Diagnosis not present

## 2012-04-26 DIAGNOSIS — C61 Malignant neoplasm of prostate: Secondary | ICD-10-CM | POA: Diagnosis not present

## 2012-05-03 ENCOUNTER — Ambulatory Visit (INDEPENDENT_AMBULATORY_CARE_PROVIDER_SITE_OTHER): Payer: Medicare Other | Admitting: Urology

## 2012-05-03 ENCOUNTER — Other Ambulatory Visit: Payer: Self-pay | Admitting: Urology

## 2012-05-03 DIAGNOSIS — C61 Malignant neoplasm of prostate: Secondary | ICD-10-CM

## 2012-05-03 DIAGNOSIS — C7952 Secondary malignant neoplasm of bone marrow: Secondary | ICD-10-CM | POA: Diagnosis not present

## 2012-05-03 DIAGNOSIS — C7951 Secondary malignant neoplasm of bone: Secondary | ICD-10-CM

## 2012-05-09 ENCOUNTER — Encounter (HOSPITAL_COMMUNITY)
Admission: RE | Admit: 2012-05-09 | Discharge: 2012-05-09 | Disposition: A | Discharge status: Home / Self Care | Payer: Medicare Other | Source: Ambulatory Visit | Attending: Urology | Admitting: Urology

## 2012-05-09 ENCOUNTER — Encounter (HOSPITAL_COMMUNITY): Payer: Self-pay

## 2012-05-09 DIAGNOSIS — C7952 Secondary malignant neoplasm of bone marrow: Secondary | ICD-10-CM | POA: Insufficient documentation

## 2012-05-09 DIAGNOSIS — C7951 Secondary malignant neoplasm of bone: Secondary | ICD-10-CM | POA: Insufficient documentation

## 2012-05-09 DIAGNOSIS — C61 Malignant neoplasm of prostate: Secondary | ICD-10-CM | POA: Diagnosis not present

## 2012-05-09 MED ORDER — TECHNETIUM TC 99M MEDRONATE IV KIT
20.0000 | PACK | Freq: Once | INTRAVENOUS | Status: AC | PRN
  Administered 2012-05-09: 20.4 via INTRAVENOUS

## 2012-05-17 ENCOUNTER — Ambulatory Visit (INDEPENDENT_AMBULATORY_CARE_PROVIDER_SITE_OTHER): Payer: Medicare Other | Admitting: Urology

## 2012-05-17 DIAGNOSIS — C61 Malignant neoplasm of prostate: Secondary | ICD-10-CM | POA: Diagnosis not present

## 2012-05-17 DIAGNOSIS — C7951 Secondary malignant neoplasm of bone: Secondary | ICD-10-CM

## 2012-05-17 DIAGNOSIS — C7952 Secondary malignant neoplasm of bone marrow: Secondary | ICD-10-CM | POA: Diagnosis not present

## 2012-08-02 DIAGNOSIS — C61 Malignant neoplasm of prostate: Secondary | ICD-10-CM | POA: Diagnosis not present

## 2012-08-02 DIAGNOSIS — C7951 Secondary malignant neoplasm of bone: Secondary | ICD-10-CM | POA: Diagnosis not present

## 2012-08-30 DIAGNOSIS — C61 Malignant neoplasm of prostate: Secondary | ICD-10-CM | POA: Diagnosis not present

## 2012-10-04 DIAGNOSIS — C7952 Secondary malignant neoplasm of bone marrow: Secondary | ICD-10-CM | POA: Diagnosis not present

## 2012-10-04 DIAGNOSIS — C61 Malignant neoplasm of prostate: Secondary | ICD-10-CM | POA: Diagnosis not present

## 2012-10-04 DIAGNOSIS — C7951 Secondary malignant neoplasm of bone: Secondary | ICD-10-CM | POA: Diagnosis not present

## 2012-11-04 DIAGNOSIS — C61 Malignant neoplasm of prostate: Secondary | ICD-10-CM | POA: Diagnosis not present

## 2012-11-04 DIAGNOSIS — C7952 Secondary malignant neoplasm of bone marrow: Secondary | ICD-10-CM | POA: Diagnosis not present

## 2012-11-04 DIAGNOSIS — C7951 Secondary malignant neoplasm of bone: Secondary | ICD-10-CM | POA: Diagnosis not present

## 2012-12-09 DIAGNOSIS — C7952 Secondary malignant neoplasm of bone marrow: Secondary | ICD-10-CM | POA: Diagnosis not present

## 2012-12-09 DIAGNOSIS — E291 Testicular hypofunction: Secondary | ICD-10-CM | POA: Diagnosis not present

## 2012-12-09 DIAGNOSIS — N320 Bladder-neck obstruction: Secondary | ICD-10-CM | POA: Diagnosis not present

## 2012-12-09 DIAGNOSIS — C7951 Secondary malignant neoplasm of bone: Secondary | ICD-10-CM | POA: Diagnosis not present

## 2012-12-09 DIAGNOSIS — C61 Malignant neoplasm of prostate: Secondary | ICD-10-CM | POA: Diagnosis not present

## 2012-12-12 ENCOUNTER — Other Ambulatory Visit: Payer: Self-pay | Admitting: Urology

## 2012-12-12 DIAGNOSIS — C61 Malignant neoplasm of prostate: Secondary | ICD-10-CM

## 2012-12-21 ENCOUNTER — Encounter (HOSPITAL_COMMUNITY)
Admission: RE | Admit: 2012-12-21 | Discharge: 2012-12-21 | Disposition: A | Discharge status: Home / Self Care | Payer: Medicare Other | Source: Ambulatory Visit | Attending: Urology | Admitting: Urology

## 2012-12-21 ENCOUNTER — Encounter (HOSPITAL_COMMUNITY): Payer: Self-pay

## 2012-12-21 DIAGNOSIS — C7952 Secondary malignant neoplasm of bone marrow: Secondary | ICD-10-CM | POA: Diagnosis not present

## 2012-12-21 DIAGNOSIS — C7951 Secondary malignant neoplasm of bone: Secondary | ICD-10-CM | POA: Diagnosis not present

## 2012-12-21 DIAGNOSIS — C61 Malignant neoplasm of prostate: Secondary | ICD-10-CM | POA: Diagnosis not present

## 2012-12-21 MED ORDER — TECHNETIUM TC 99M MEDRONATE IV KIT
25.0000 | PACK | Freq: Once | INTRAVENOUS | Status: AC | PRN
  Administered 2012-12-21: 25 via INTRAVENOUS

## 2013-01-10 DIAGNOSIS — C61 Malignant neoplasm of prostate: Secondary | ICD-10-CM | POA: Diagnosis not present

## 2013-01-10 DIAGNOSIS — C7951 Secondary malignant neoplasm of bone: Secondary | ICD-10-CM | POA: Diagnosis not present

## 2013-01-10 DIAGNOSIS — C7952 Secondary malignant neoplasm of bone marrow: Secondary | ICD-10-CM | POA: Diagnosis not present

## 2013-02-14 DIAGNOSIS — C7951 Secondary malignant neoplasm of bone: Secondary | ICD-10-CM | POA: Diagnosis not present

## 2013-02-14 DIAGNOSIS — C61 Malignant neoplasm of prostate: Secondary | ICD-10-CM | POA: Diagnosis not present

## 2013-02-23 DIAGNOSIS — C7951 Secondary malignant neoplasm of bone: Secondary | ICD-10-CM | POA: Diagnosis not present

## 2013-03-01 DIAGNOSIS — C61 Malignant neoplasm of prostate: Secondary | ICD-10-CM | POA: Diagnosis not present

## 2013-03-01 DIAGNOSIS — E291 Testicular hypofunction: Secondary | ICD-10-CM | POA: Diagnosis not present

## 2013-03-01 DIAGNOSIS — C7951 Secondary malignant neoplasm of bone: Secondary | ICD-10-CM | POA: Diagnosis not present

## 2013-03-14 DIAGNOSIS — C7951 Secondary malignant neoplasm of bone: Secondary | ICD-10-CM | POA: Diagnosis not present

## 2013-03-14 DIAGNOSIS — C61 Malignant neoplasm of prostate: Secondary | ICD-10-CM | POA: Diagnosis not present

## 2013-04-07 DIAGNOSIS — M109 Gout, unspecified: Secondary | ICD-10-CM | POA: Diagnosis not present

## 2013-04-07 DIAGNOSIS — N189 Chronic kidney disease, unspecified: Secondary | ICD-10-CM | POA: Diagnosis not present

## 2013-04-07 DIAGNOSIS — R7301 Impaired fasting glucose: Secondary | ICD-10-CM | POA: Diagnosis not present

## 2013-04-07 DIAGNOSIS — Z0000 Encounter for general adult medical examination without abnormal findings: Secondary | ICD-10-CM | POA: Diagnosis not present

## 2013-04-07 DIAGNOSIS — Z125 Encounter for screening for malignant neoplasm of prostate: Secondary | ICD-10-CM | POA: Diagnosis not present

## 2013-04-07 DIAGNOSIS — R809 Proteinuria, unspecified: Secondary | ICD-10-CM | POA: Diagnosis not present

## 2013-04-14 ENCOUNTER — Other Ambulatory Visit: Payer: Self-pay | Admitting: Internal Medicine

## 2013-04-14 DIAGNOSIS — Z23 Encounter for immunization: Secondary | ICD-10-CM | POA: Diagnosis not present

## 2013-04-14 DIAGNOSIS — N529 Male erectile dysfunction, unspecified: Secondary | ICD-10-CM | POA: Diagnosis not present

## 2013-04-14 DIAGNOSIS — D320 Benign neoplasm of cerebral meninges: Secondary | ICD-10-CM | POA: Diagnosis not present

## 2013-04-14 DIAGNOSIS — N289 Disorder of kidney and ureter, unspecified: Secondary | ICD-10-CM

## 2013-04-14 DIAGNOSIS — K219 Gastro-esophageal reflux disease without esophagitis: Secondary | ICD-10-CM | POA: Diagnosis not present

## 2013-04-14 DIAGNOSIS — Z0000 Encounter for general adult medical examination without abnormal findings: Secondary | ICD-10-CM | POA: Diagnosis not present

## 2013-04-14 DIAGNOSIS — R809 Proteinuria, unspecified: Secondary | ICD-10-CM | POA: Diagnosis not present

## 2013-04-14 DIAGNOSIS — J301 Allergic rhinitis due to pollen: Secondary | ICD-10-CM | POA: Diagnosis not present

## 2013-04-14 DIAGNOSIS — E669 Obesity, unspecified: Secondary | ICD-10-CM | POA: Diagnosis not present

## 2013-04-14 DIAGNOSIS — M129 Arthropathy, unspecified: Secondary | ICD-10-CM | POA: Diagnosis not present

## 2013-04-15 DIAGNOSIS — Z23 Encounter for immunization: Secondary | ICD-10-CM | POA: Diagnosis not present

## 2013-04-17 ENCOUNTER — Ambulatory Visit
Admission: RE | Admit: 2013-04-17 | Discharge: 2013-04-17 | Disposition: A | Discharge status: Home / Self Care | Payer: Medicare Other | Source: Ambulatory Visit | Attending: Internal Medicine | Admitting: Internal Medicine

## 2013-04-17 DIAGNOSIS — N281 Cyst of kidney, acquired: Secondary | ICD-10-CM | POA: Diagnosis not present

## 2013-04-17 DIAGNOSIS — N289 Disorder of kidney and ureter, unspecified: Secondary | ICD-10-CM

## 2013-04-19 DIAGNOSIS — C61 Malignant neoplasm of prostate: Secondary | ICD-10-CM | POA: Diagnosis not present

## 2013-04-19 DIAGNOSIS — C7951 Secondary malignant neoplasm of bone: Secondary | ICD-10-CM | POA: Diagnosis not present

## 2013-04-21 DIAGNOSIS — Z1212 Encounter for screening for malignant neoplasm of rectum: Secondary | ICD-10-CM | POA: Diagnosis not present

## 2013-04-24 DIAGNOSIS — N189 Chronic kidney disease, unspecified: Secondary | ICD-10-CM | POA: Diagnosis not present

## 2013-05-17 DIAGNOSIS — C7951 Secondary malignant neoplasm of bone: Secondary | ICD-10-CM | POA: Diagnosis not present

## 2013-05-17 DIAGNOSIS — C61 Malignant neoplasm of prostate: Secondary | ICD-10-CM | POA: Diagnosis not present

## 2013-05-31 DIAGNOSIS — C7951 Secondary malignant neoplasm of bone: Secondary | ICD-10-CM | POA: Diagnosis not present

## 2013-05-31 DIAGNOSIS — E291 Testicular hypofunction: Secondary | ICD-10-CM | POA: Diagnosis not present

## 2013-05-31 DIAGNOSIS — C61 Malignant neoplasm of prostate: Secondary | ICD-10-CM | POA: Diagnosis not present

## 2013-06-20 DIAGNOSIS — C7951 Secondary malignant neoplasm of bone: Secondary | ICD-10-CM | POA: Diagnosis not present

## 2013-06-20 DIAGNOSIS — C61 Malignant neoplasm of prostate: Secondary | ICD-10-CM | POA: Diagnosis not present

## 2013-06-20 DIAGNOSIS — C7952 Secondary malignant neoplasm of bone marrow: Secondary | ICD-10-CM | POA: Diagnosis not present

## 2013-07-21 DIAGNOSIS — C7952 Secondary malignant neoplasm of bone marrow: Secondary | ICD-10-CM | POA: Diagnosis not present

## 2013-07-21 DIAGNOSIS — C61 Malignant neoplasm of prostate: Secondary | ICD-10-CM | POA: Diagnosis not present

## 2013-07-21 DIAGNOSIS — C7951 Secondary malignant neoplasm of bone: Secondary | ICD-10-CM | POA: Diagnosis not present

## 2013-08-22 DIAGNOSIS — C61 Malignant neoplasm of prostate: Secondary | ICD-10-CM | POA: Diagnosis not present

## 2013-08-22 DIAGNOSIS — C7951 Secondary malignant neoplasm of bone: Secondary | ICD-10-CM | POA: Diagnosis not present

## 2013-09-06 DIAGNOSIS — C7952 Secondary malignant neoplasm of bone marrow: Secondary | ICD-10-CM | POA: Diagnosis not present

## 2013-09-06 DIAGNOSIS — C61 Malignant neoplasm of prostate: Secondary | ICD-10-CM | POA: Diagnosis not present

## 2013-09-06 DIAGNOSIS — E291 Testicular hypofunction: Secondary | ICD-10-CM | POA: Diagnosis not present

## 2013-09-06 DIAGNOSIS — C7951 Secondary malignant neoplasm of bone: Secondary | ICD-10-CM | POA: Diagnosis not present

## 2013-09-26 DIAGNOSIS — C7951 Secondary malignant neoplasm of bone: Secondary | ICD-10-CM | POA: Diagnosis not present

## 2013-09-26 DIAGNOSIS — C61 Malignant neoplasm of prostate: Secondary | ICD-10-CM | POA: Diagnosis not present

## 2013-10-17 DIAGNOSIS — E669 Obesity, unspecified: Secondary | ICD-10-CM | POA: Diagnosis not present

## 2013-10-17 DIAGNOSIS — C61 Malignant neoplasm of prostate: Secondary | ICD-10-CM | POA: Diagnosis not present

## 2013-10-17 DIAGNOSIS — M109 Gout, unspecified: Secondary | ICD-10-CM | POA: Diagnosis not present

## 2013-10-17 DIAGNOSIS — R7301 Impaired fasting glucose: Secondary | ICD-10-CM | POA: Diagnosis not present

## 2013-10-17 DIAGNOSIS — J329 Chronic sinusitis, unspecified: Secondary | ICD-10-CM | POA: Diagnosis not present

## 2013-10-17 DIAGNOSIS — Z6831 Body mass index (BMI) 31.0-31.9, adult: Secondary | ICD-10-CM | POA: Diagnosis not present

## 2013-10-18 DIAGNOSIS — R0902 Hypoxemia: Secondary | ICD-10-CM | POA: Diagnosis not present

## 2013-10-19 ENCOUNTER — Other Ambulatory Visit: Payer: Self-pay | Admitting: Urology

## 2013-10-19 DIAGNOSIS — C61 Malignant neoplasm of prostate: Secondary | ICD-10-CM

## 2013-10-24 DIAGNOSIS — C61 Malignant neoplasm of prostate: Secondary | ICD-10-CM | POA: Diagnosis not present

## 2013-10-27 DIAGNOSIS — C61 Malignant neoplasm of prostate: Secondary | ICD-10-CM | POA: Diagnosis not present

## 2013-10-27 DIAGNOSIS — C7951 Secondary malignant neoplasm of bone: Secondary | ICD-10-CM | POA: Diagnosis not present

## 2013-11-29 DIAGNOSIS — C61 Malignant neoplasm of prostate: Secondary | ICD-10-CM | POA: Diagnosis not present

## 2013-11-29 DIAGNOSIS — C7951 Secondary malignant neoplasm of bone: Secondary | ICD-10-CM | POA: Diagnosis not present

## 2013-11-29 DIAGNOSIS — C7952 Secondary malignant neoplasm of bone marrow: Secondary | ICD-10-CM | POA: Diagnosis not present

## 2013-12-06 ENCOUNTER — Encounter (HOSPITAL_COMMUNITY)
Admission: RE | Admit: 2013-12-06 | Discharge: 2013-12-06 | Disposition: A | Discharge status: Home / Self Care | Payer: Medicare Other | Source: Ambulatory Visit | Attending: Urology | Admitting: Urology

## 2013-12-06 ENCOUNTER — Encounter (HOSPITAL_COMMUNITY): Payer: Self-pay

## 2013-12-06 DIAGNOSIS — C7952 Secondary malignant neoplasm of bone marrow: Secondary | ICD-10-CM

## 2013-12-06 DIAGNOSIS — C7951 Secondary malignant neoplasm of bone: Secondary | ICD-10-CM | POA: Diagnosis not present

## 2013-12-06 DIAGNOSIS — M899 Disorder of bone, unspecified: Secondary | ICD-10-CM | POA: Diagnosis not present

## 2013-12-06 DIAGNOSIS — M949 Disorder of cartilage, unspecified: Secondary | ICD-10-CM | POA: Diagnosis not present

## 2013-12-06 DIAGNOSIS — C61 Malignant neoplasm of prostate: Secondary | ICD-10-CM

## 2013-12-06 MED ORDER — TECHNETIUM TC 99M MEDRONATE IV KIT
25.0000 | PACK | Freq: Once | INTRAVENOUS | Status: AC | PRN
  Administered 2013-12-06: 25 via INTRAVENOUS

## 2013-12-13 DIAGNOSIS — E291 Testicular hypofunction: Secondary | ICD-10-CM | POA: Diagnosis not present

## 2013-12-13 DIAGNOSIS — R110 Nausea: Secondary | ICD-10-CM | POA: Diagnosis not present

## 2013-12-13 DIAGNOSIS — C7951 Secondary malignant neoplasm of bone: Secondary | ICD-10-CM | POA: Diagnosis not present

## 2013-12-13 DIAGNOSIS — C61 Malignant neoplasm of prostate: Secondary | ICD-10-CM | POA: Diagnosis not present

## 2013-12-13 DIAGNOSIS — C7952 Secondary malignant neoplasm of bone marrow: Secondary | ICD-10-CM | POA: Diagnosis not present

## 2013-12-18 ENCOUNTER — Telehealth: Payer: Self-pay | Admitting: Oncology

## 2013-12-18 NOTE — Telephone Encounter (Signed)
S/W PATIENT AND GAVE NP APPT FOR 07/09 @ 1:30 W/DR. SHADAD.  [place] DAHLSTEDT DX- PROSTATE CA WELCOME PACKET MAILED

## 2013-12-18 NOTE — Telephone Encounter (Signed)
C/D 12/18/13 for appt. 12/21/13 °

## 2013-12-20 ENCOUNTER — Other Ambulatory Visit: Payer: Self-pay | Admitting: Oncology

## 2013-12-20 DIAGNOSIS — C61 Malignant neoplasm of prostate: Secondary | ICD-10-CM

## 2013-12-21 ENCOUNTER — Encounter: Payer: Self-pay | Admitting: Oncology

## 2013-12-21 ENCOUNTER — Telehealth: Payer: Self-pay | Admitting: Oncology

## 2013-12-21 ENCOUNTER — Ambulatory Visit (HOSPITAL_BASED_OUTPATIENT_CLINIC_OR_DEPARTMENT_OTHER): Payer: Medicare Other | Admitting: Oncology

## 2013-12-21 ENCOUNTER — Ambulatory Visit: Payer: Medicare Other

## 2013-12-21 ENCOUNTER — Other Ambulatory Visit (HOSPITAL_BASED_OUTPATIENT_CLINIC_OR_DEPARTMENT_OTHER): Payer: Medicare Other

## 2013-12-21 ENCOUNTER — Encounter: Payer: Self-pay | Admitting: *Deleted

## 2013-12-21 VITALS — BP 117/74 | HR 66 | Temp 97.5°F | Resp 18 | Ht 71.0 in | Wt 225.3 lb

## 2013-12-21 DIAGNOSIS — C7951 Secondary malignant neoplasm of bone: Secondary | ICD-10-CM

## 2013-12-21 DIAGNOSIS — C7952 Secondary malignant neoplasm of bone marrow: Secondary | ICD-10-CM

## 2013-12-21 DIAGNOSIS — C61 Malignant neoplasm of prostate: Secondary | ICD-10-CM

## 2013-12-21 LAB — CBC WITH DIFFERENTIAL/PLATELET
BASO%: 0.8 % (ref 0.0–2.0)
Basophils Absolute: 0.1 10*3/uL (ref 0.0–0.1)
EOS ABS: 0 10*3/uL (ref 0.0–0.5)
EOS%: 0.4 % (ref 0.0–7.0)
HEMATOCRIT: 39.7 % (ref 38.4–49.9)
HGB: 13.2 g/dL (ref 13.0–17.1)
LYMPH#: 1.3 10*3/uL (ref 0.9–3.3)
LYMPH%: 17.4 % (ref 14.0–49.0)
MCH: 30.5 pg (ref 27.2–33.4)
MCHC: 33.1 g/dL (ref 32.0–36.0)
MCV: 92 fL (ref 79.3–98.0)
MONO#: 0.4 10*3/uL (ref 0.1–0.9)
MONO%: 5.1 % (ref 0.0–14.0)
NEUT%: 76.3 % — ABNORMAL HIGH (ref 39.0–75.0)
NEUTROS ABS: 5.5 10*3/uL (ref 1.5–6.5)
PLATELETS: 153 10*3/uL (ref 140–400)
RBC: 4.32 10*6/uL (ref 4.20–5.82)
RDW: 14.8 % — ABNORMAL HIGH (ref 11.0–14.6)
WBC: 7.2 10*3/uL (ref 4.0–10.3)

## 2013-12-21 LAB — COMPREHENSIVE METABOLIC PANEL (CC13)
ALT: 16 U/L (ref 0–55)
ANION GAP: 8 meq/L (ref 3–11)
AST: 17 U/L (ref 5–34)
Albumin: 3.5 g/dL (ref 3.5–5.0)
Alkaline Phosphatase: 52 U/L (ref 40–150)
BILIRUBIN TOTAL: 0.41 mg/dL (ref 0.20–1.20)
BUN: 19.5 mg/dL (ref 7.0–26.0)
CO2: 24 mEq/L (ref 22–29)
CREATININE: 1.6 mg/dL — AB (ref 0.7–1.3)
Calcium: 8.6 mg/dL (ref 8.4–10.4)
Chloride: 110 mEq/L — ABNORMAL HIGH (ref 98–109)
Glucose: 100 mg/dl (ref 70–140)
Potassium: 4.5 mEq/L (ref 3.5–5.1)
Sodium: 142 mEq/L (ref 136–145)
Total Protein: 6.4 g/dL (ref 6.4–8.3)

## 2013-12-21 NOTE — Consult Note (Signed)
Reason for Referral: Prostate cancer.   HPI: 80 year old gentleman currently of [place] where he lived the majority of his life. 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 Is a gentleman with a history of GERD and gout and for the most part relatively healthy. His initial diagnosis of prostate cancer dates back to 2007. At that time he presented with a PSA of 4.2 and found to have a Gleason score of 4+3 equals 7. His initial clinical staging with stage T2B intermediate risk adenocarcinoma. He was treated with radiation therapy in the care of Dr. Valere Dross. His PSA did increase in April 2009 up to 1.15 and approximately 26.5 and 2010. Casodex was added in 2010 and his PSA declined to 0.84. In November of 2012 he developed bony metastasis including left frontal bone and subsequently received radiation therapy under the care of Dr. Valere Dross back concluded in February of 2013 for a total of 3000 cGy in 10 sessions to the left frontal bone. He developed multiple foci of metastatic disease in November of 2013 occluding both iliac bones in the left acetabulum. He was treated with total androgen depravation and continuing Casodex. He was started on x-ray then January of 2014. His most recent bone scan in 12/07/2013 showed increase in the appearance of L2 metastatic foci. His PSA in March of 2015 was up to 10.64 when his Casodex was discontinued. His most recent PSA was 8.67 in May of 2015.   Clinically, he is relatively asymptomatic. He does report some occasional fatigue and tiredness. He does report some occasional nausea. But really no constitutional symptoms. He does not report any headaches or blurry vision or double vision. He has not reported any syncope or seizures. He does not report any fevers or chills or sweats or weight loss. Appetite and performance status remained stable. Is that report any chest pain shortness of breath or cough. As that  report any hemoptysis or hematemesis. Does not report any leg edema or palpitation. Does not report any frequency urgency or hesitancy. Does not report any skeletal complaints back pain shoulder pain or hip pain. Does not report any arthralgias or myalgias. He does not report any rashes or lesions or petechiae. Rest of his review of systems unremarkable.   Past Medical History  Diagnosis Date  . Prostate cancer   . GERD (gastroesophageal reflux disease)   . Gout   . Cancer     "skin cancer"  . Arthritis     finger  . History of pneumonia     when in the [employer]  . Erectile dysfunction   . CRI (chronic renal insufficiency)     mild asymptomatic for years  . Skin cancer     non melanoma  . BPH (benign prostatic hyperplasia)   . IFG (impaired fasting glucose)   . Allergy     allergic rhinitis  :  Past Surgical History  Procedure Laterality Date  . History of radiation therapy    . Skin cancer excised      Multiple skin cancers excised from hands and back  :  Current Outpatient Prescriptions  Medication Sig Dispense Refill  . allopurinol (ZYLOPRIM) 100 MG tablet Take 100 mg by mouth daily.      . diphenhydrAMINE (BENADRYL) 25 MG tablet Take 25 mg by mouth every 6 (six) hours as needed.      Marland Kitchen omeprazole (PRILOSEC) 20 MG capsule Take 20 mg by  mouth daily.      . ondansetron (ZOFRAN-ODT) 4 MG disintegrating tablet Take 4 mg by mouth every 8 (eight) hours as needed for nausea or vomiting.      . simethicone (MYLICON) 80 MG chewable tablet Chew 80 mg by mouth every 6 (six) hours as needed.      . Tamsulosin HCl (FLOMAX) 0.4 MG CAPS Take by mouth.      . Vitamin D, Cholecalciferol, 400 UNITS CHEW Chew 800 Units by mouth daily.       No current facility-administered medications for this visit.      Allergies  Allergen Reactions  . Antihistamines, Chlorpheniramine-Type   :  Family History  Problem Relation Age of Onset  . COPD Mother   . Heart disease Mother   . Cancer  Father     prostate  . Heart attack Father   . Cancer Brother     prostate  :  History   Social History  . Marital Status: Married    Spouse Name: N/A    Number of Children: N/A  . Years of Education: N/A   Occupational History  . Not on file.   Social History Main Topics  . Smoking status: Former Smoker -- 0.50 packs/day for 15 years    Types: Cigarettes    Quit date: 06/30/1960  . Smokeless tobacco: Never Used  . Alcohol Use: Yes     Comment: occasionally  red wine  and beer   . Drug Use: No  . Sexual Activity:    Other Topics Concern  . Not on file   Social History Narrative  . No narrative on file  :  Pertinent items are noted in HPI.  Exam: Blood pressure 117/74, pulse 66, temperature 97.5 F (36.4 C), temperature source Oral, resp. rate 18, height 5\' 11"  (1.803 m), weight 225 lb 4.8 oz (102.195 kg), SpO2 99.00%. General appearance: alert and cooperative Head: Normocephalic, without obvious abnormality Throat: lips, mucosa, and tongue normal; teeth and gums normal Neck: no adenopathy Back: symmetric, no curvature. ROM normal. No CVA tenderness. Resp: clear to auscultation bilaterally Chest wall: no tenderness Cardio: regular rate and rhythm, S1, S2 normal, no murmur, click, rub or gallop GI: soft, non-tender; bowel sounds normal; no masses,  no organomegaly Extremities: extremities normal, atraumatic, no cyanosis or edema Pulses: 2+ and symmetric Skin: Skin color, texture, turgor normal. No rashes or lesions Lymph nodes: Cervical, supraclavicular, and axillary nodes normal.   Recent Labs  12/21/13 1331  WBC 7.2  HGB 13.2  HCT 39.7  PLT 153    Recent Labs  12/21/13 1331  NA 142  K 4.5  CO2 24  GLUCOSE 100  BUN 19.5  CREATININE 1.6*  CALCIUM 8.6     Nm Bone Scan Whole Body  12/06/2013   CLINICAL DATA:  Prostate cancer.  EXAM: NUCLEAR MEDICINE WHOLE BODY BONE SCAN  TECHNIQUE: Whole body anterior and posterior images were obtained  approximately 3 hours after intravenous injection of radiopharmaceutical.  RADIOPHARMACEUTICALS:  25.0 mCi Technetium-99 MDP  COMPARISON:  12/21/2012  FINDINGS: The L2 metastatic lesion appears to be slightly larger and has more activity than on the prior bone scans. The L5 lesion appears stable. Suspect new lesion in the L3 vertebral body. Bilateral pelvic disease appears relatively stable.  The skull lesion appears relatively stable. Mild stable degenerative uptake in the shoulders, sternoclavicular joints common mandible band both knees.  IMPRESSION: 1. The L2 metastatic lesion appears larger and more conspicuous. 2. Stable  small L5 lesion and bilateral bony pelvic disease. 3. Suspect small new lesion in L3.   Electronically Signed   By: Kalman Jewels M.D.   On: 12/06/2013 14:12    Assessment and Plan:   80 year old gentleman with the following issues:  1. Castration resistant prostate cancer with metastatic disease to the bone. His initial diagnosis was 2007 with a Gleason score 4+3 equal 7 and a PSA of 4. He received a definitive therapy with radiation therapy and developed biochemical relapse initially treated with androgen deprivation. He developed bony metastasis including a frontal bone metastasis and was treated with radiation therapy in February of 2013. Most recently his PSA was up to 10.64 despite castration levels of testosterone. His most recent bone scan in June of 2015 showed progression of disease. Options of treatments were discussed today extensively with Mr. Lotito and his wife. These options would include continued observation, surgical on hormonal manipulation with ketoconazole and prednisone, single agent Zytiga single agent Xtandi, systemic chemotherapy, Provenge immune therapy and finally clinical trial. I discussed with him the opportunity to enroll of a clinical trial that studies the combination of [place] and Xtandi versus Xtandi alone. Risks and benefits of all of these  treatments were discussed and he is interested in learning about the study further. After he gets the appropriate information and the informed consent he'll make up his decision at that point. He decides to enroll in the study we will certainly support that, fatigue does not then we will start Xtandi as a single agent as a second line hormonal therapy. Complications of single agent Xtandi was discussed today including nausea, fatigue, tiredness, lower extremity edema and rarely seizures. Complications of Zytiga were also discussed that includes hepatotoxicity, electrolyte imbalance and 1 constitutional symptoms  The plan will be at this point: Obtain a staging workup with a CT scan and awaits on the patient's preference.  2. Androgen depravation: I recommend that he continues on the current androgen depravation therapy.  3. Bone directed therapy: He is currently on Xgeva which I recommended to continue monthly.

## 2013-12-21 NOTE — Progress Notes (Signed)
Checked in new pt with no financial concerns. °

## 2013-12-21 NOTE — Progress Notes (Signed)
Please see consult note.  

## 2013-12-21 NOTE — Telephone Encounter (Signed)
gv pt appt schedule for aug. central will call w/ct appt - pt aware.

## 2013-12-22 ENCOUNTER — Telehealth: Payer: Self-pay | Admitting: Oncology

## 2013-12-22 LAB — PSA: PSA: 13.38 ng/mL — ABNORMAL HIGH (ref <=4.00)

## 2013-12-22 LAB — TESTOSTERONE: TESTOSTERONE: 42 ng/dL — AB (ref 300–890)

## 2013-12-22 NOTE — Telephone Encounter (Signed)
per staff message from FS move 8/14 appt to 8/11 appt should be 8/11. appt moved. s/w pt he is aware.

## 2013-12-26 ENCOUNTER — Encounter (HOSPITAL_COMMUNITY): Payer: Self-pay

## 2013-12-26 ENCOUNTER — Ambulatory Visit (HOSPITAL_COMMUNITY)
Admission: RE | Admit: 2013-12-26 | Discharge: 2013-12-26 | Disposition: A | Discharge status: Home / Self Care | Payer: Medicare Other | Source: Ambulatory Visit | Attending: Oncology | Admitting: Oncology

## 2013-12-26 DIAGNOSIS — C7952 Secondary malignant neoplasm of bone marrow: Secondary | ICD-10-CM

## 2013-12-26 DIAGNOSIS — C7951 Secondary malignant neoplasm of bone: Secondary | ICD-10-CM | POA: Diagnosis not present

## 2013-12-26 DIAGNOSIS — I7090 Unspecified atherosclerosis: Secondary | ICD-10-CM | POA: Insufficient documentation

## 2013-12-26 DIAGNOSIS — N62 Hypertrophy of breast: Secondary | ICD-10-CM | POA: Insufficient documentation

## 2013-12-26 DIAGNOSIS — K449 Diaphragmatic hernia without obstruction or gangrene: Secondary | ICD-10-CM | POA: Diagnosis not present

## 2013-12-26 DIAGNOSIS — N281 Cyst of kidney, acquired: Secondary | ICD-10-CM | POA: Insufficient documentation

## 2013-12-26 DIAGNOSIS — C61 Malignant neoplasm of prostate: Secondary | ICD-10-CM

## 2013-12-26 DIAGNOSIS — K7689 Other specified diseases of liver: Secondary | ICD-10-CM | POA: Insufficient documentation

## 2013-12-26 MED ORDER — IOHEXOL 300 MG/ML  SOLN
100.0000 mL | Freq: Once | INTRAMUSCULAR | Status: AC | PRN
  Administered 2013-12-26: 100 mL via INTRAVENOUS

## 2013-12-29 DIAGNOSIS — C7952 Secondary malignant neoplasm of bone marrow: Secondary | ICD-10-CM | POA: Diagnosis not present

## 2013-12-29 DIAGNOSIS — C7951 Secondary malignant neoplasm of bone: Secondary | ICD-10-CM | POA: Diagnosis not present

## 2013-12-29 DIAGNOSIS — C61 Malignant neoplasm of prostate: Secondary | ICD-10-CM | POA: Diagnosis not present

## 2014-01-23 ENCOUNTER — Ambulatory Visit (HOSPITAL_BASED_OUTPATIENT_CLINIC_OR_DEPARTMENT_OTHER): Payer: Medicare Other | Admitting: Oncology

## 2014-01-23 ENCOUNTER — Other Ambulatory Visit (HOSPITAL_BASED_OUTPATIENT_CLINIC_OR_DEPARTMENT_OTHER): Payer: Medicare Other

## 2014-01-23 ENCOUNTER — Encounter: Payer: Self-pay | Admitting: Oncology

## 2014-01-23 ENCOUNTER — Telehealth: Payer: Self-pay | Admitting: Oncology

## 2014-01-23 VITALS — BP 151/75 | HR 63 | Temp 98.0°F | Resp 19 | Ht 71.0 in | Wt 226.4 lb

## 2014-01-23 DIAGNOSIS — C7952 Secondary malignant neoplasm of bone marrow: Secondary | ICD-10-CM | POA: Diagnosis not present

## 2014-01-23 DIAGNOSIS — C61 Malignant neoplasm of prostate: Secondary | ICD-10-CM

## 2014-01-23 DIAGNOSIS — C7951 Secondary malignant neoplasm of bone: Secondary | ICD-10-CM

## 2014-01-23 DIAGNOSIS — E291 Testicular hypofunction: Secondary | ICD-10-CM | POA: Diagnosis not present

## 2014-01-23 LAB — CBC WITH DIFFERENTIAL/PLATELET
BASO%: 1.2 % (ref 0.0–2.0)
Basophils Absolute: 0.1 10*3/uL (ref 0.0–0.1)
EOS ABS: 0.1 10*3/uL (ref 0.0–0.5)
EOS%: 1.6 % (ref 0.0–7.0)
HCT: 41.7 % (ref 38.4–49.9)
HGB: 13.4 g/dL (ref 13.0–17.1)
LYMPH%: 20.4 % (ref 14.0–49.0)
MCH: 29.6 pg (ref 27.2–33.4)
MCHC: 32.3 g/dL (ref 32.0–36.0)
MCV: 91.8 fL (ref 79.3–98.0)
MONO#: 0.4 10*3/uL (ref 0.1–0.9)
MONO%: 6.4 % (ref 0.0–14.0)
NEUT%: 70.4 % (ref 39.0–75.0)
NEUTROS ABS: 4.3 10*3/uL (ref 1.5–6.5)
PLATELETS: 156 10*3/uL (ref 140–400)
RBC: 4.54 10*6/uL (ref 4.20–5.82)
RDW: 15 % — AB (ref 11.0–14.6)
WBC: 6.1 10*3/uL (ref 4.0–10.3)
lymph#: 1.3 10*3/uL (ref 0.9–3.3)

## 2014-01-23 LAB — COMPREHENSIVE METABOLIC PANEL
ALT: 16 U/L (ref 0–53)
AST: 18 U/L (ref 0–37)
Albumin: 3.9 g/dL (ref 3.5–5.2)
Alkaline Phosphatase: 53 U/L (ref 39–117)
BUN: 24 mg/dL — ABNORMAL HIGH (ref 6–23)
CHLORIDE: 102 meq/L (ref 96–112)
CO2: 27 meq/L (ref 19–32)
Calcium: 9 mg/dL (ref 8.4–10.5)
Creatinine, Ser: 1.6 mg/dL — ABNORMAL HIGH (ref 0.50–1.35)
Glucose, Bld: 97 mg/dL (ref 70–99)
Potassium: 4.7 mEq/L (ref 3.5–5.3)
SODIUM: 135 meq/L (ref 135–145)
TOTAL PROTEIN: 6.2 g/dL (ref 6.0–8.3)
Total Bilirubin: 0.4 mg/dL (ref 0.2–1.2)

## 2014-01-23 MED ORDER — ENZALUTAMIDE 40 MG PO CAPS: 160.0000 mg | ORAL_CAPSULE | Freq: Every day | ORAL | Status: DC

## 2014-01-23 NOTE — Progress Notes (Signed)
Hematology and Oncology Follow Up Visit  Eric Deleon [id number] [birth year] 80 y.o. 01/23/2014 11:29 AM Eric Deleon, MDPerini, Eric How, MD   Principle Diagnosis: 80 year old gentleman with castration resistant prostate cancer with metastatic disease to the bone as well as pelvic adenopathy. He was diagnosed in 2007 with a Gleason score 4+3 equals 7 and a PSA of 4.   Prior Therapy:  He received a definitive therapy with radiation therapy and developed biochemical relapse initially treated with androgen deprivation.  He developed bony metastasis including a frontal bone metastasis and was treated with radiation therapy in February of 2013. Most recently his PSA was up to 13.38 despite castration levels of testosterone. His most recent bone scan in June of 2015 showed progression of disease.   Current therapy: Under evaluation for the start of systemic therapy in the form of Xtandi.  Interim History: Eric Deleon presents today for a followup visit with his wife and daughter. He is a gentleman with castration resistant prostate cancer is here to discuss his options. Since his last visit, he underwent CT scan for staging purposes. He continues to be relatively asymptomatic. He does not report any headaches or blurry vision or double vision. Does not report any syncope or seizures.He does report some occasional nausea. But really no constitutional symptoms. He does not report any fevers or chills or sweats or weight loss. Appetite and performance status remained stable. Is that report any chest pain shortness of breath or cough. As that report any hemoptysis or hematemesis. Does not report any leg edema or palpitation. Does not report any frequency urgency or hesitancy. Does not report any skeletal complaints back pain shoulder pain or hip pain. Does not report any arthralgias or myalgias. He does not report any rashes or lesions or petechiae. Rest of his review of systems unremarkable   Medications:    Current Outpatient Prescriptions  Medication Sig Dispense Refill  . allopurinol (ZYLOPRIM) 100 MG tablet Take 100 mg by mouth daily.      . diphenhydrAMINE (BENADRYL) 25 MG tablet Take 25 mg by mouth every 6 (six) hours as needed.      . enzalutamide (XTANDI) 40 MG capsule Take 4 capsules (160 mg total) by mouth daily.  120 capsule  0  . omeprazole (PRILOSEC) 20 MG capsule Take 20 mg by mouth daily.      . ondansetron (ZOFRAN-ODT) 4 MG disintegrating tablet Take 4 mg by mouth every 8 (eight) hours as needed for nausea or vomiting.      . simethicone (MYLICON) 80 MG chewable tablet Chew 80 mg by mouth every 6 (six) hours as needed.      . Tamsulosin HCl (FLOMAX) 0.4 MG CAPS Take by mouth.      . Vitamin D, Cholecalciferol, 400 UNITS CHEW Chew 800 Units by mouth daily.       No current facility-administered medications for this visit.     Allergies:  Allergies  Allergen Reactions  . Antihistamines, Chlorpheniramine-Type     Past Medical History, Surgical history, Social history, and Family History were reviewed and updated.   Physical Exam: Blood pressure 151/75, pulse 63, temperature 98 F (36.7 C), temperature source Oral, resp. rate 19, height 5\' 11"  (1.803 m), weight 226 lb 6.4 oz (102.694 kg). ECOG:  General appearance: alert and cooperative Head: Normocephalic, without obvious abnormality Neck: no adenopathy Lymph nodes: Cervical, supraclavicular, and axillary nodes normal. Heart:regular rate and rhythm, S1, S2 normal, no murmur, click, rub or gallop Lung:chest clear, no  wheezing, rales, normal symmetric air entry Abdomin: soft, non-tender, without masses or organomegaly EXT:no erythema, induration, or nodules   Lab Results: Lab Results  Component Value Date   WBC 6.1 01/23/2014   HGB 13.4 01/23/2014   HCT 41.7 01/23/2014   MCV 91.8 01/23/2014   PLT 156 01/23/2014     Chemistry      Component Value Date/Time   NA 142 12/21/2013 1331   K 4.5 12/21/2013 1331   CO2 24  12/21/2013 1331   BUN 19.5 12/21/2013 1331   CREATININE 1.6* 12/21/2013 1331      Component Value Date/Time   CALCIUM 8.6 12/21/2013 1331   ALKPHOS 52 12/21/2013 1331   AST 17 12/21/2013 1331   ALT 16 12/21/2013 1331   BILITOT 0.41 12/21/2013 1331      CLINICAL DATA: Restaging prostate cancer diagnosed 8 years ago  status post brachytherapy. Hormone therapy and progress.  EXAM:  CT CHEST, ABDOMEN, AND PELVIS WITH CONTRAST  TECHNIQUE:  Multidetector CT imaging of the chest, abdomen and pelvis was  performed following the standard protocol during bolus  administration of intravenous contrast.  CONTRAST: 151mL OMNIPAQUE IOHEXOL 300 MG/ML SOLN  COMPARISON: Whole body bone scan 12/06/2013. Abdominal pelvic CT  06/04/2011.  FINDINGS:  CT CHEST FINDINGS  Mediastinum: There are no enlarged mediastinal, hilar or axillary  lymph nodes. The thyroid gland, trachea and esophagus appear normal  aside from a small hiatal hernia. The heart size is normal.There is  minimal atherosclerosis.  Lungs/ Pleura: There is no pleural or pericardial effusion.There is  mild peripheral scarring at the left lung base. No confluent  airspace opacity or suspicious nodule demonstrated.  Musculoskeletal/Chest wall: Mild bilateral gynecomastia noted. There  are suspected blastic metastases within the left aspect of the T6  vertebral body (image 30) and within several ribs bilaterally.  CT ABDOMEN AND PELVIS FINDINGS  Ill-defined low-density posteriorly in the dome of the right hepatic  lobe on images 44 and 45 is stable and likely benign. There are no  suspicious liver lesions. The gallbladder, biliary system, pancreas,  spleen and adrenal glands appear normal.  Again demonstrated are multiple bilateral renal cysts. The largest  is in the lower pole of the left kidney, measuring 9.4 cm. No  enhancing renal mass or hydronephrosis demonstrated.  The stomach, small bowel and appendix demonstrate no abnormalities.  There  diverticular changes of the sigmoid colon without surrounding  inflammation.  Prostate brachytherapy seeds are unchanged in position. The bladder  appears normal. There are right external iliac nodal masses  measuring 3.2 x 3.0 cm on image 98 and 2.8 x 1.6 cm on image 104. No  left pelvic or inguinal adenopathy is identified. Small  retroperitoneal lymph nodes are noted, including a retro aortic 11  mm node on image 75.  Multiple blastic osseous metastases are demonstrated. The largest is  in the L2 vertebral body, corresponding with the activity on the  recent bone scan. There are also lesions posteriorly in the L5  vertebral body, in both iliac bones and in the left pubic bones. No  pathologic fracture identified.  IMPRESSION:  1. Multiple osseous metastases within the chest, abdomen and pelvis.  2. Right pelvic and retroperitoneal lymphadenopathy consistent with  metastatic prostate cancer.  3. No evidence of bowel or ureteral obstruction.  4. Stable bilateral renal cysts.     Impression and Plan:  1. Castration resistant prostate cancer with metastatic disease to the bone. His initial diagnosis was 2007 with a  Gleason score 4+3 equal 7 and a PSA of 4. His most recent PSA is 13.38. Results of his bone scan and CT scan were discussed today and showed multiple osseous metastasis as well as pelvic adenopathy. Options of treatments were discussed again including enrollment in clinical trial. He elected to proceed with [facility] after further discussion. Risks and benefits of this medication were discussed again. Complications and side effects that includes nausea, fatigue, lower extremity edema and rarely seizures were discussed and written information was given to the patient. He is willing to proceed. I will evaluate him in one month after he has been on this medication for a few weeks.  2. Androgen depravation: I recommend that he continues on the current androgen depravation therapy. He is  receiving [facility].  3. Bone directed therapy: He is currently on Xgeva which I recommended to continue monthly. He is receiving it at [facility].      [facility], MD 8/11/201511:29 AM

## 2014-01-23 NOTE — Progress Notes (Signed)
Script for xtandi given to [employer] in managed care. Patient given information and packet on xtandi.

## 2014-01-23 NOTE — Telephone Encounter (Signed)
, °

## 2014-01-24 ENCOUNTER — Encounter: Payer: Self-pay | Admitting: Oncology

## 2014-01-24 LAB — PSA: PSA: 13.26 ng/mL — ABNORMAL HIGH (ref <=4.00)

## 2014-01-24 NOTE — Progress Notes (Signed)
Received letter from [employer].  Pt is approved for medications for metastatic castrate resistant prostate cancer from 01/24/14 until 01/24/15.  The amount of the grant is $11,000.

## 2014-01-26 ENCOUNTER — Ambulatory Visit: Payer: Medicare Other | Admitting: Oncology

## 2014-01-26 ENCOUNTER — Other Ambulatory Visit: Payer: Medicare Other

## 2014-01-26 DIAGNOSIS — C61 Malignant neoplasm of prostate: Secondary | ICD-10-CM | POA: Diagnosis not present

## 2014-01-26 DIAGNOSIS — C7951 Secondary malignant neoplasm of bone: Secondary | ICD-10-CM | POA: Diagnosis not present

## 2014-02-22 ENCOUNTER — Other Ambulatory Visit: Payer: Self-pay | Admitting: Oncology

## 2014-02-27 ENCOUNTER — Ambulatory Visit (HOSPITAL_BASED_OUTPATIENT_CLINIC_OR_DEPARTMENT_OTHER): Payer: Medicare Other | Admitting: Oncology

## 2014-02-27 ENCOUNTER — Telehealth: Payer: Self-pay | Admitting: Oncology

## 2014-02-27 ENCOUNTER — Other Ambulatory Visit (HOSPITAL_BASED_OUTPATIENT_CLINIC_OR_DEPARTMENT_OTHER): Payer: Medicare Other

## 2014-02-27 ENCOUNTER — Encounter: Payer: Self-pay | Admitting: Oncology

## 2014-02-27 VITALS — BP 114/72 | HR 87 | Temp 97.9°F | Resp 20 | Ht 71.0 in | Wt 224.1 lb

## 2014-02-27 DIAGNOSIS — C7951 Secondary malignant neoplasm of bone: Secondary | ICD-10-CM

## 2014-02-27 DIAGNOSIS — E291 Testicular hypofunction: Secondary | ICD-10-CM | POA: Diagnosis not present

## 2014-02-27 DIAGNOSIS — C7952 Secondary malignant neoplasm of bone marrow: Secondary | ICD-10-CM

## 2014-02-27 DIAGNOSIS — C61 Malignant neoplasm of prostate: Secondary | ICD-10-CM

## 2014-02-27 LAB — COMPREHENSIVE METABOLIC PANEL (CC13)
ALK PHOS: 58 U/L (ref 40–150)
ALT: 18 U/L (ref 0–55)
AST: 16 U/L (ref 5–34)
Albumin: 3.7 g/dL (ref 3.5–5.0)
Anion Gap: 9 mEq/L (ref 3–11)
BUN: 19.6 mg/dL (ref 7.0–26.0)
CALCIUM: 8.9 mg/dL (ref 8.4–10.4)
CHLORIDE: 108 meq/L (ref 98–109)
CO2: 26 mEq/L (ref 22–29)
CREATININE: 1.7 mg/dL — AB (ref 0.7–1.3)
Glucose: 115 mg/dl (ref 70–140)
Potassium: 4.1 mEq/L (ref 3.5–5.1)
Sodium: 142 mEq/L (ref 136–145)
Total Bilirubin: 0.35 mg/dL (ref 0.20–1.20)
Total Protein: 6.9 g/dL (ref 6.4–8.3)

## 2014-02-27 LAB — CBC WITH DIFFERENTIAL/PLATELET
BASO%: 0.9 % (ref 0.0–2.0)
Basophils Absolute: 0.1 10*3/uL (ref 0.0–0.1)
EOS%: 1.8 % (ref 0.0–7.0)
Eosinophils Absolute: 0.1 10*3/uL (ref 0.0–0.5)
HCT: 43.2 % (ref 38.4–49.9)
HGB: 14.2 g/dL (ref 13.0–17.1)
LYMPH%: 23.3 % (ref 14.0–49.0)
MCH: 30 pg (ref 27.2–33.4)
MCHC: 32.8 g/dL (ref 32.0–36.0)
MCV: 91.5 fL (ref 79.3–98.0)
MONO#: 0.4 10*3/uL (ref 0.1–0.9)
MONO%: 5.8 % (ref 0.0–14.0)
NEUT#: 4.5 10*3/uL (ref 1.5–6.5)
NEUT%: 68.2 % (ref 39.0–75.0)
Platelets: 171 10*3/uL (ref 140–400)
RBC: 4.72 10*6/uL (ref 4.20–5.82)
RDW: 14.9 % — ABNORMAL HIGH (ref 11.0–14.6)
WBC: 6.6 10*3/uL (ref 4.0–10.3)
lymph#: 1.5 10*3/uL (ref 0.9–3.3)

## 2014-02-27 NOTE — Telephone Encounter (Signed)
Pt confirmed labs/ov per 09/15 POF, gave pt AVS......KJ °

## 2014-02-27 NOTE — Progress Notes (Signed)
Hematology and Oncology Follow Up Visit  Eric Deleon [id number] Nov 06, 1931 80 y.o. 02/27/2014 3:31 PM Eric Deleon, MDPerini, Eric How, MD   Principle Diagnosis: 80 year old gentleman with castration resistant prostate cancer with metastatic disease to the bone as well as pelvic adenopathy. He was diagnosed in 2007 with a Gleason score 4+3 equals 7 and a PSA of 4.   Prior Therapy:  He received a definitive therapy with radiation therapy and developed biochemical relapse initially treated with androgen deprivation.  He developed bony metastasis including a frontal bone metastasis and was treated with radiation therapy in February of 2013. Most recently his PSA was up to 13.38 despite castration levels of testosterone. His most recent bone scan in June of 2015 showed progression of disease.   Current therapy: Xtandi 160 mg daily started in August of 2015.  Interim History: Eric Deleon presents today for a followup visit with his wife. Since his last visit, he started [place] without any complications. He did not report any nausea or vomiting. He did have mild fatigue but is manageable. She does not report any seizure activity or changes in his performance status. He continues to be relatively asymptomatic. He does not report any headaches or blurry vision or double vision. Does not report any syncope or seizures.He does report some occasional nausea. But really no constitutional symptoms. He does not report any fevers or chills or sweats or weight loss. Appetite and performance status remained stable. Is that report any chest pain shortness of breath or cough. As that report any hemoptysis or hematemesis. Does not report any leg edema or palpitation. Does not report any frequency urgency or hesitancy. Does not report any skeletal complaints back pain shoulder pain or hip pain. Does not report any arthralgias or myalgias. He does not report any rashes or lesions or petechiae. Rest of his review of systems  unremarkable   Medications:   Current Outpatient Prescriptions  Medication Sig Dispense Refill  . allopurinol (ZYLOPRIM) 100 MG tablet Take 100 mg by mouth daily.      . diphenhydrAMINE (BENADRYL) 25 MG tablet Take 25 mg by mouth every 6 (six) hours as needed.      Marland Kitchen omeprazole (PRILOSEC) 20 MG capsule Take 20 mg by mouth daily.      . ondansetron (ZOFRAN-ODT) 4 MG disintegrating tablet Take 4 mg by mouth every 8 (eight) hours as needed for nausea or vomiting.      . simethicone (MYLICON) 80 MG chewable tablet Chew 80 mg by mouth every 6 (six) hours as needed.      . Tamsulosin HCl (FLOMAX) 0.4 MG CAPS Take by mouth.      . Vitamin D, Cholecalciferol, 400 UNITS CHEW Chew 800 Units by mouth daily.      [name] 40 MG capsule TAKE 4 CAPSULES BY MOUTH DAILY  120 capsule  0   No current facility-administered medications for this visit.     Allergies:  Allergies  Allergen Reactions  . Antihistamines, Chlorpheniramine-Type     Past Medical History, Surgical history, Social history, and Family History were reviewed and updated.   Physical Exam: Blood pressure 114/72, pulse 87, temperature 97.9 F (36.6 C), temperature source Oral, resp. rate 20, height 5\' 11"  (1.803 m), weight 224 lb 1.6 oz (101.651 kg), SpO2 99.00%. ECOG: 1 General appearance: alert and cooperative Head: Normocephalic, without obvious abnormality Neck: no adenopathy Lymph nodes: Cervical, supraclavicular, and axillary nodes normal. Heart:regular rate and rhythm, S1, S2 normal, no murmur, click, rub or  gallop Lung:chest clear, no wheezing, rales, normal symmetric air entry Abdomin: soft, non-tender, without masses or organomegaly EXT:no erythema, induration, or nodules   Lab Results: Lab Results  Component Value Date   WBC 6.6 02/27/2014   HGB 14.2 02/27/2014   HCT 43.2 02/27/2014   MCV 91.5 02/27/2014   PLT 171 02/27/2014     Chemistry      Component Value Date/Time   NA 135 01/23/2014 1055   NA 142  12/21/2013 1331   K 4.7 01/23/2014 1055   K 4.5 12/21/2013 1331   CL 102 01/23/2014 1055   CO2 27 01/23/2014 1055   CO2 24 12/21/2013 1331   BUN 24* 01/23/2014 1055   BUN 19.5 12/21/2013 1331   CREATININE 1.60* 01/23/2014 1055   CREATININE 1.6* 12/21/2013 1331      Component Value Date/Time   CALCIUM 9.0 01/23/2014 1055   CALCIUM 8.6 12/21/2013 1331   ALKPHOS 53 01/23/2014 1055   ALKPHOS 52 12/21/2013 1331   AST 18 01/23/2014 1055   AST 17 12/21/2013 1331   ALT 16 01/23/2014 1055   ALT 16 12/21/2013 1331   BILITOT 0.4 01/23/2014 1055   BILITOT 0.41 12/21/2013 1331        Impression and Plan:  80 year old with:  1. Castration resistant prostate cancer with metastatic disease to the bone. His initial diagnosis was 2007 with a Gleason score 4+3 equal 7 and a PSA of 4. His most recent PSA is 13.38. He is currently on Xtandi and has tolerated it well. The plan is to continue on the current dose and schedule and follow his PSA monthly. His PSA is currently pending from today.  2. Androgen depravation: I recommend that he continues on the current androgen depravation therapy. He is receiving [facility].  3. Bone directed therapy: He is currently on Xgeva which I recommended to continue monthly. He is receiving it at [facility]. Continue to educate him about osteonecrosis of the job and other complications.  4. Followup: Will be in one month for reevaluation.      [facility], MD 9/15/20153:31 PM

## 2014-02-28 ENCOUNTER — Telehealth: Payer: Self-pay | Admitting: *Deleted

## 2014-02-28 DIAGNOSIS — C7952 Secondary malignant neoplasm of bone marrow: Secondary | ICD-10-CM | POA: Diagnosis not present

## 2014-02-28 DIAGNOSIS — C7951 Secondary malignant neoplasm of bone: Secondary | ICD-10-CM | POA: Diagnosis not present

## 2014-02-28 DIAGNOSIS — C61 Malignant neoplasm of prostate: Secondary | ICD-10-CM | POA: Diagnosis not present

## 2014-02-28 LAB — PSA: PSA: 5.18 ng/mL — ABNORMAL HIGH (ref <=4.00)

## 2014-02-28 NOTE — Telephone Encounter (Signed)
Message copied by Randolm Idol on Wed Feb 28, 2014  2:42 PM ------      Message from: Wyatt Portela      Created: Wed Feb 28, 2014  8:16 AM       Please call his PSA. Down this time. ------

## 2014-02-28 NOTE — Telephone Encounter (Signed)
Spoke with patient, gave results of PSA. 

## 2014-02-28 NOTE — Telephone Encounter (Signed)
Message copied by Randolm Idol on Wed Feb 28, 2014  3:33 PM ------      Message from: Wyatt Portela      Created: Wed Feb 28, 2014  8:16 AM       Please call his PSA. Down this time. ------

## 2014-03-14 DIAGNOSIS — C61 Malignant neoplasm of prostate: Secondary | ICD-10-CM | POA: Diagnosis not present

## 2014-03-21 DIAGNOSIS — C61 Malignant neoplasm of prostate: Secondary | ICD-10-CM | POA: Diagnosis not present

## 2014-03-21 DIAGNOSIS — C7951 Secondary malignant neoplasm of bone: Secondary | ICD-10-CM | POA: Diagnosis not present

## 2014-03-21 DIAGNOSIS — N320 Bladder-neck obstruction: Secondary | ICD-10-CM | POA: Diagnosis not present

## 2014-03-26 ENCOUNTER — Other Ambulatory Visit: Payer: Self-pay | Admitting: Oncology

## 2014-03-28 DIAGNOSIS — Z23 Encounter for immunization: Secondary | ICD-10-CM | POA: Diagnosis not present

## 2014-04-03 ENCOUNTER — Other Ambulatory Visit (HOSPITAL_BASED_OUTPATIENT_CLINIC_OR_DEPARTMENT_OTHER): Payer: Medicare Other

## 2014-04-03 ENCOUNTER — Encounter: Payer: Self-pay | Admitting: Oncology

## 2014-04-03 ENCOUNTER — Telehealth: Payer: Self-pay | Admitting: Oncology

## 2014-04-03 ENCOUNTER — Ambulatory Visit (HOSPITAL_BASED_OUTPATIENT_CLINIC_OR_DEPARTMENT_OTHER): Payer: Medicare Other | Admitting: Oncology

## 2014-04-03 VITALS — BP 136/75 | HR 76 | Temp 97.7°F | Resp 18 | Ht 71.0 in | Wt 222.6 lb

## 2014-04-03 DIAGNOSIS — C7951 Secondary malignant neoplasm of bone: Secondary | ICD-10-CM

## 2014-04-03 DIAGNOSIS — C61 Malignant neoplasm of prostate: Secondary | ICD-10-CM | POA: Diagnosis not present

## 2014-04-03 DIAGNOSIS — E291 Testicular hypofunction: Secondary | ICD-10-CM

## 2014-04-03 LAB — COMPREHENSIVE METABOLIC PANEL (CC13)
ALBUMIN: 3.6 g/dL (ref 3.5–5.0)
ALT: 19 U/L (ref 0–55)
AST: 20 U/L (ref 5–34)
Alkaline Phosphatase: 49 U/L (ref 40–150)
Anion Gap: 8 mEq/L (ref 3–11)
BUN: 22.7 mg/dL (ref 7.0–26.0)
CALCIUM: 9.5 mg/dL (ref 8.4–10.4)
CHLORIDE: 105 meq/L (ref 98–109)
CO2: 24 meq/L (ref 22–29)
Creatinine: 1.7 mg/dL — ABNORMAL HIGH (ref 0.7–1.3)
Glucose: 111 mg/dl (ref 70–140)
Potassium: 4.7 mEq/L (ref 3.5–5.1)
Sodium: 137 mEq/L (ref 136–145)
Total Bilirubin: 0.49 mg/dL (ref 0.20–1.20)
Total Protein: 6.6 g/dL (ref 6.4–8.3)

## 2014-04-03 LAB — CBC WITH DIFFERENTIAL/PLATELET
BASO%: 1 % (ref 0.0–2.0)
BASOS ABS: 0.1 10*3/uL (ref 0.0–0.1)
EOS%: 2.8 % (ref 0.0–7.0)
Eosinophils Absolute: 0.2 10*3/uL (ref 0.0–0.5)
HEMATOCRIT: 41.2 % (ref 38.4–49.9)
HEMOGLOBIN: 13.2 g/dL (ref 13.0–17.1)
LYMPH#: 1.2 10*3/uL (ref 0.9–3.3)
LYMPH%: 22.1 % (ref 14.0–49.0)
MCH: 29.5 pg (ref 27.2–33.4)
MCHC: 32.2 g/dL (ref 32.0–36.0)
MCV: 91.7 fL (ref 79.3–98.0)
MONO#: 0.4 10*3/uL (ref 0.1–0.9)
MONO%: 6.3 % (ref 0.0–14.0)
NEUT%: 67.8 % (ref 39.0–75.0)
NEUTROS ABS: 3.8 10*3/uL (ref 1.5–6.5)
Platelets: 171 10*3/uL (ref 140–400)
RBC: 4.49 10*6/uL (ref 4.20–5.82)
RDW: 15.6 % — ABNORMAL HIGH (ref 11.0–14.6)
WBC: 5.6 10*3/uL (ref 4.0–10.3)

## 2014-04-03 NOTE — Telephone Encounter (Signed)
gv and printed appt sched and avs for pt fro NOV

## 2014-04-03 NOTE — Progress Notes (Signed)
Hematology and Oncology Follow Up Visit  Eric Deleon [id number] 1931/12/30 80 y.o. 04/03/2014 10:12 AM Eric Deleon, MDPerini, Jeannette How, MD   Principle Diagnosis: 80 year old gentleman with castration resistant prostate cancer with metastatic disease to the bone as well as pelvic adenopathy. He was diagnosed in 2007 with a Gleason score 4+3 equals 7 and a PSA of 4.   Prior Therapy:  He received a definitive therapy with radiation therapy and developed biochemical relapse initially treated with androgen deprivation.  He developed bony metastasis including a frontal bone metastasis and was treated with radiation therapy in February of 2013. Most recently his PSA was up to 13.38 despite castration levels of testosterone. His most recent bone scan in June of 2015 showed progression of disease.   Current therapy: Xtandi 160 mg daily started in August of 2015.  Interim History: Eric Deleon presents today for a followup visit with his wife. Since his last visit, he reports no new complaints. He continues to be on Xtandi without any complications. He did report mild nausea but no vomiting. He did have mild fatigue but is manageable. He continues to be active and performs activities of daily living. She does not report any seizure activity or changes in his performance status. He continues to be relatively asymptomatic. He does not report any headaches or blurry vision or double vision. Does not report any syncope or seizures.He does report some occasional nausea. But really no constitutional symptoms. He does not report any fevers or chills or sweats or weight loss. Appetite and performance status remained stable. Is that report any chest pain shortness of breath or cough. As that report any hemoptysis or hematemesis. Does not report any leg edema or palpitation. Does not report any frequency urgency or hesitancy. Does not report any skeletal complaints back pain shoulder pain or hip pain. Does not report any  arthralgias or myalgias. He does not report any rashes or lesions or petechiae. Rest of his review of systems unremarkable   Medications:   Current Outpatient Prescriptions  Medication Sig Dispense Refill  . allopurinol (ZYLOPRIM) 100 MG tablet Take 100 mg by mouth daily.      . diphenhydrAMINE (BENADRYL) 25 MG tablet Take 25 mg by mouth every 6 (six) hours as needed.      Marland Kitchen omeprazole (PRILOSEC) 20 MG capsule Take 20 mg by mouth daily.      . ondansetron (ZOFRAN-ODT) 4 MG disintegrating tablet Take 4 mg by mouth every 8 (eight) hours as needed for nausea or vomiting.      . simethicone (MYLICON) 80 MG chewable tablet Chew 80 mg by mouth every 6 (six) hours as needed.      . Tamsulosin HCl (FLOMAX) 0.4 MG CAPS Take by mouth.      . Vitamin D, Cholecalciferol, 400 UNITS CHEW Chew 800 Units by mouth daily.      [name] 40 MG capsule TAKE 4 CAPSULES BY MOUTH DAILY  120 capsule  0   No current facility-administered medications for this visit.     Allergies:  Allergies  Allergen Reactions  . Antihistamines, Chlorpheniramine-Type     Past Medical History, Surgical history, Social history, and Family History were reviewed and updated.   Physical Exam: Blood pressure 136/75, pulse 76, temperature 97.7 F (36.5 C), temperature source Oral, resp. rate 18, height 5\' 11"  (1.803 m), weight 222 lb 9.6 oz (100.971 kg). ECOG: 1 General appearance: alert and cooperative Head: Normocephalic, without obvious abnormality Neck: no adenopathy Lymph nodes: Cervical,  supraclavicular, and axillary nodes normal. Heart:regular rate and rhythm, S1, S2 normal, no murmur, click, rub or gallop Lung:chest clear, no wheezing, rales, normal symmetric air entry Abdomin: soft, non-tender, without masses or organomegaly EXT:no erythema, induration, or nodules   Lab Results: Lab Results  Component Value Date   WBC 5.6 04/03/2014   HGB 13.2 04/03/2014   HCT 41.2 04/03/2014   MCV 91.7 04/03/2014   PLT 171  04/03/2014     Chemistry      Component Value Date/Time   NA 142 02/27/2014 1454   NA 135 01/23/2014 1055   K 4.1 02/27/2014 1454   K 4.7 01/23/2014 1055   CL 102 01/23/2014 1055   CO2 26 02/27/2014 1454   CO2 27 01/23/2014 1055   BUN 19.6 02/27/2014 1454   BUN 24* 01/23/2014 1055   CREATININE 1.7* 02/27/2014 1454   CREATININE 1.60* 01/23/2014 1055      Component Value Date/Time   CALCIUM 8.9 02/27/2014 1454   CALCIUM 9.0 01/23/2014 1055   ALKPHOS 58 02/27/2014 1454   ALKPHOS 53 01/23/2014 1055   AST 16 02/27/2014 1454   AST 18 01/23/2014 1055   ALT 18 02/27/2014 1454   ALT 16 01/23/2014 1055   BILITOT 0.35 02/27/2014 1454   BILITOT 0.4 01/23/2014 1055      Results for Eric Deleon (MRN [id number]) as of 04/03/2014 09:13  Ref. Range 01/23/2014 10:56 02/27/2014 14:54  PSA Latest Range: <=4.00 ng/mL 13.26 (H) 5.18 (H)    Impression and Plan:  80 year old with:  1. Castration resistant prostate cancer with metastatic disease to the bone. His initial diagnosis was 2007 with a Gleason score 4+3 equal 7 and a PSA of 4. His most recent PSA is 13.38. He is currently on Xtandi and has tolerated it well. His PSA after 1 month of therapy dropped to 5.18. He is reporting excellent quality of life and no complications at this time. I plan on to continue the current dose and schedule and continue to follow his PSA regularly.  2. Androgen depravation: I recommend that he continues on the current androgen depravation therapy. He is receiving [facility].  3. Bone directed therapy: He is currently on Xgeva which I recommended to continue monthly. He is receiving it at [facility]. Continue to educate him about osteonecrosis of the job and other complications.  4. Followup: Will be in one month for reevaluation.      PYPPJK,DTOIZ, MD 10/20/201510:12 AM

## 2014-04-04 ENCOUNTER — Telehealth: Payer: Self-pay | Admitting: *Deleted

## 2014-04-04 LAB — PSA: PSA: 4.82 ng/mL — ABNORMAL HIGH (ref <=4.00)

## 2014-04-04 NOTE — Telephone Encounter (Signed)
Message copied by Randolm Idol on Wed Apr 04, 2014  1:23 PM ------      Message from: Wyatt Portela      Created: Wed Apr 04, 2014  9:14 AM       Please call his PSA. Still going down. ------

## 2014-04-04 NOTE — Telephone Encounter (Signed)
Spoke with patient, gave results of PSA done on 04/03/14

## 2014-04-23 ENCOUNTER — Other Ambulatory Visit: Payer: Self-pay | Admitting: Oncology

## 2014-04-23 DIAGNOSIS — C61 Malignant neoplasm of prostate: Secondary | ICD-10-CM

## 2014-04-23 NOTE — Telephone Encounter (Signed)
THIS REFILL REQUEST FOR XTANDI WAS PLACED IN DR.SHADAD'S ACTIVE WORK FOLDER. 

## 2014-05-01 ENCOUNTER — Other Ambulatory Visit (HOSPITAL_BASED_OUTPATIENT_CLINIC_OR_DEPARTMENT_OTHER): Payer: Medicare Other

## 2014-05-01 ENCOUNTER — Ambulatory Visit (HOSPITAL_BASED_OUTPATIENT_CLINIC_OR_DEPARTMENT_OTHER): Payer: Medicare Other | Admitting: Physician Assistant

## 2014-05-01 ENCOUNTER — Encounter: Payer: Self-pay | Admitting: Physician Assistant

## 2014-05-01 VITALS — BP 128/77 | HR 79 | Temp 98.1°F | Resp 18 | Ht 71.0 in | Wt 222.5 lb

## 2014-05-01 DIAGNOSIS — C61 Malignant neoplasm of prostate: Secondary | ICD-10-CM

## 2014-05-01 DIAGNOSIS — C7951 Secondary malignant neoplasm of bone: Secondary | ICD-10-CM | POA: Diagnosis not present

## 2014-05-01 LAB — CBC WITH DIFFERENTIAL/PLATELET
BASO%: 0.9 % (ref 0.0–2.0)
Basophils Absolute: 0.1 10*3/uL (ref 0.0–0.1)
EOS%: 1.4 % (ref 0.0–7.0)
Eosinophils Absolute: 0.1 10*3/uL (ref 0.0–0.5)
HCT: 40.2 % (ref 38.4–49.9)
HGB: 13 g/dL (ref 13.0–17.1)
LYMPH%: 24.4 % (ref 14.0–49.0)
MCH: 30.2 pg (ref 27.2–33.4)
MCHC: 32.4 g/dL (ref 32.0–36.0)
MCV: 93.2 fL (ref 79.3–98.0)
MONO#: 0.4 10*3/uL (ref 0.1–0.9)
MONO%: 5.8 % (ref 0.0–14.0)
NEUT#: 4.2 10*3/uL (ref 1.5–6.5)
NEUT%: 67.5 % (ref 39.0–75.0)
PLATELETS: 163 10*3/uL (ref 140–400)
RBC: 4.31 10*6/uL (ref 4.20–5.82)
RDW: 15.5 % — AB (ref 11.0–14.6)
WBC: 6.3 10*3/uL (ref 4.0–10.3)
lymph#: 1.5 10*3/uL (ref 0.9–3.3)

## 2014-05-01 LAB — COMPREHENSIVE METABOLIC PANEL (CC13)
ALT: 9 U/L (ref 0–55)
AST: 14 U/L (ref 5–34)
Albumin: 3.5 g/dL (ref 3.5–5.0)
Alkaline Phosphatase: 52 U/L (ref 40–150)
Anion Gap: 8 mEq/L (ref 3–11)
BILIRUBIN TOTAL: 0.39 mg/dL (ref 0.20–1.20)
BUN: 20.7 mg/dL (ref 7.0–26.0)
CO2: 25 mEq/L (ref 22–29)
Calcium: 8.8 mg/dL (ref 8.4–10.4)
Chloride: 107 mEq/L (ref 98–109)
Creatinine: 1.7 mg/dL — ABNORMAL HIGH (ref 0.7–1.3)
Glucose: 132 mg/dl (ref 70–140)
Potassium: 4.1 mEq/L (ref 3.5–5.1)
SODIUM: 140 meq/L (ref 136–145)
Total Protein: 6.4 g/dL (ref 6.4–8.3)

## 2014-05-01 NOTE — Progress Notes (Signed)
Hematology and Oncology Follow Up Visit  Eric Deleon [id number] Nov 21, 1931 80 y.o. 05/01/2014 10:12 PM Eric Deleon, MDPerini, Jeannette How, MD   Principle Diagnosis: 80 year old gentleman with castration resistant prostate cancer with metastatic disease to the bone as well as pelvic adenopathy. He was diagnosed in 2007 with a Gleason score 4+3 equals 7 and a PSA of 4.   Prior Therapy:  He received a definitive therapy with radiation therapy and developed biochemical relapse initially treated with androgen deprivation.  He developed bony metastasis including a frontal bone metastasis and was treated with radiation therapy in February of 2013. Most recently his PSA was up to 13.38 despite castration levels of testosterone. His most recent bone scan in June of 2015 showed progression of disease.   Current therapy: Xtandi 160 mg daily started in August of 2015.  Interim History: Eric Deleon presents today for a followup visit unaccompanied. Since his last visit, he reports no new complaints. He continues to be on Xtandi without any complications. He does report occasional mild nausea but no vomiting. He did have mild fatigue but is manageable. He continues to be active and performs activities of daily living. He does not report any seizure activity or changes in his performance status. He continues to be relatively asymptomatic. He reports he received a flu vaccine at his primary care doctor's last week. There is a skin lesion on his left medial calf that is been present for quite some time and is beginning to look on. He is well known to Dr. Allyson Deleon and we'll arrange an appointment for this to be evaluated further. He does not report any headaches or blurry vision or double vision. Does not report any syncope or seizures.He does report some occasional nausea. But really no constitutional symptoms. He does not report any fevers or chills or sweats or weight loss. Appetite and performance status remained  stable. Is that report any chest pain shortness of breath or cough. As that report any hemoptysis or hematemesis. Does not report any leg edema or palpitation. Does not report any frequency urgency or hesitancy. Does not report any skeletal complaints back pain shoulder pain or hip pain. Does not report any arthralgias or myalgias. He does not report any rashes or lesions or petechiae. Remainder of his review of systems unremarkable   Medications:   Current Outpatient Prescriptions  Medication Sig Dispense Refill  . allopurinol (ZYLOPRIM) 100 MG tablet Take 100 mg by mouth daily.    . diphenhydrAMINE (BENADRYL) 25 MG tablet Take 25 mg by mouth every 6 (six) hours as needed.    Marland Kitchen omeprazole (PRILOSEC) 20 MG capsule Take 20 mg by mouth daily.    . ondansetron (ZOFRAN-ODT) 4 MG disintegrating tablet Take 4 mg by mouth every 8 (eight) hours as needed for nausea or vomiting.    . simethicone (MYLICON) 80 MG chewable tablet Chew 80 mg by mouth every 6 (six) hours as needed.    . Tamsulosin HCl (FLOMAX) 0.4 MG CAPS Take by mouth.    . Vitamin D, Cholecalciferol, 400 UNITS CHEW Chew 800 Units by mouth daily.    [name] 40 MG capsule TAKE 4 CAPSULES BY MOUTH DAILY 120 capsule 0   No current facility-administered medications for this visit.     Allergies:  Allergies  Allergen Reactions  . Antihistamines, Chlorpheniramine-Type     Past Medical History, Surgical history, Social history, and Family History were reviewed and updated.   Physical Exam: Blood pressure 128/77, pulse 79, temperature 98.1  F (36.7 C), temperature source Oral, resp. rate 18, height 5\' 11"  (1.803 m), weight 222 lb 8 oz (100.925 kg), SpO2 100 %. ECOG: 1 General appearance: alert and cooperative Head: Normocephalic, without obvious abnormality Neck: no adenopathy Lymph nodes: Cervical, supraclavicular, and axillary nodes normal. Heart:regular rate and rhythm, S1, S2 normal, no murmur, click, rub or gallop Lung:chest  clear, no wheezing, rales, normal symmetric air entry Abdomin: soft, non-tender, without masses or organomegaly EXT:no erythema, induration, or nodules Skin: Left knee there is a approximate 1 cm plaque-like lesion with  hypopigmentation on a more tanned/ erythematous plaque. There is no streaking warmth or purulent or serosanguineous drainage.  Lab Results: Lab Results  Component Value Date   WBC 6.3 05/01/2014   HGB 13.0 05/01/2014   HCT 40.2 05/01/2014   MCV 93.2 05/01/2014   PLT 163 05/01/2014     Chemistry      Component Value Date/Time   NA 140 05/01/2014 1501   NA 135 01/23/2014 1055   K 4.1 05/01/2014 1501   K 4.7 01/23/2014 1055   CL 102 01/23/2014 1055   CO2 25 05/01/2014 1501   CO2 27 01/23/2014 1055   BUN 20.7 05/01/2014 1501   BUN 24* 01/23/2014 1055   CREATININE 1.7* 05/01/2014 1501   CREATININE 1.60* 01/23/2014 1055      Component Value Date/Time   CALCIUM 8.8 05/01/2014 1501   CALCIUM 9.0 01/23/2014 1055   ALKPHOS 52 05/01/2014 1501   ALKPHOS 53 01/23/2014 1055   AST 14 05/01/2014 1501   AST 18 01/23/2014 1055   ALT 9 05/01/2014 1501   ALT 16 01/23/2014 1055   BILITOT 0.39 05/01/2014 1501   BILITOT 0.4 01/23/2014 1055      Results for Eric Deleon (MRN [id number]) as of 04/03/2014 09:13  Ref. Range 01/23/2014 10:56 02/27/2014 14:54  PSA Latest Range: <=4.00 ng/mL 13.26 (H) 5.18 (H)    Impression and Plan:  80 year old with:  1. Castration resistant prostate cancer with metastatic disease to the bone. His initial diagnosis was 2007 with a Gleason score 4+3 equal 7 and a PSA of 4. His most recent PSA is 13.38. He is currently on Xtandi and has tolerated it well. His PSA after 1 month of therapy dropped to 5.18. He is reporting excellent quality of life and no complications at this time.Plan is to continue the current dose and schedule and continue to follow his PSA regularly.  2. Androgen depravation: Recommended  that he continues on the current  androgen depravation therapy. He is receiving [facility].  3. Bone directed therapy: He is currently on Xgeva an we recommend for him to continue monthly. He is receiving it at [facility]. Continue to educate him about osteonecrosis of the job and other complications.  4. Followup: Will be in one month for reevaluation.      Wynetta Emery, Lesette Frary E, PA-C  11/17/201510:12 PM

## 2014-05-02 ENCOUNTER — Telehealth: Payer: Self-pay | Admitting: Oncology

## 2014-05-02 DIAGNOSIS — R030 Elevated blood-pressure reading, without diagnosis of hypertension: Secondary | ICD-10-CM | POA: Diagnosis not present

## 2014-05-02 DIAGNOSIS — Z008 Encounter for other general examination: Secondary | ICD-10-CM | POA: Diagnosis not present

## 2014-05-02 DIAGNOSIS — Z125 Encounter for screening for malignant neoplasm of prostate: Secondary | ICD-10-CM | POA: Diagnosis not present

## 2014-05-02 DIAGNOSIS — M109 Gout, unspecified: Secondary | ICD-10-CM | POA: Diagnosis not present

## 2014-05-02 DIAGNOSIS — E785 Hyperlipidemia, unspecified: Secondary | ICD-10-CM | POA: Diagnosis not present

## 2014-05-02 DIAGNOSIS — R7301 Impaired fasting glucose: Secondary | ICD-10-CM | POA: Diagnosis not present

## 2014-05-02 LAB — PSA: PSA: 4.92 ng/mL — ABNORMAL HIGH (ref <=4.00)

## 2014-05-02 NOTE — Telephone Encounter (Signed)
s.w. pt wife and advised on DEC appt....pt ok and aware

## 2014-05-04 NOTE — Patient Instructions (Signed)
Continue Xtandi at the current dose Follow up in 1 month 

## 2014-05-09 DIAGNOSIS — D0472 Carcinoma in situ of skin of left lower limb, including hip: Secondary | ICD-10-CM | POA: Diagnosis not present

## 2014-05-09 DIAGNOSIS — N529 Male erectile dysfunction, unspecified: Secondary | ICD-10-CM | POA: Diagnosis not present

## 2014-05-09 DIAGNOSIS — D329 Benign neoplasm of meninges, unspecified: Secondary | ICD-10-CM | POA: Diagnosis not present

## 2014-05-09 DIAGNOSIS — E785 Hyperlipidemia, unspecified: Secondary | ICD-10-CM | POA: Diagnosis not present

## 2014-05-09 DIAGNOSIS — Z1389 Encounter for screening for other disorder: Secondary | ICD-10-CM | POA: Diagnosis not present

## 2014-05-09 DIAGNOSIS — R030 Elevated blood-pressure reading, without diagnosis of hypertension: Secondary | ICD-10-CM | POA: Diagnosis not present

## 2014-05-09 DIAGNOSIS — R809 Proteinuria, unspecified: Secondary | ICD-10-CM | POA: Diagnosis not present

## 2014-05-09 DIAGNOSIS — C44729 Squamous cell carcinoma of skin of left lower limb, including hip: Secondary | ICD-10-CM | POA: Diagnosis not present

## 2014-05-09 DIAGNOSIS — C61 Malignant neoplasm of prostate: Secondary | ICD-10-CM | POA: Diagnosis not present

## 2014-05-09 DIAGNOSIS — E669 Obesity, unspecified: Secondary | ICD-10-CM | POA: Diagnosis not present

## 2014-05-09 DIAGNOSIS — J302 Other seasonal allergic rhinitis: Secondary | ICD-10-CM | POA: Diagnosis not present

## 2014-05-09 DIAGNOSIS — Z0000 Encounter for general adult medical examination without abnormal findings: Secondary | ICD-10-CM | POA: Diagnosis not present

## 2014-05-18 DIAGNOSIS — C7951 Secondary malignant neoplasm of bone: Secondary | ICD-10-CM | POA: Diagnosis not present

## 2014-05-18 DIAGNOSIS — C61 Malignant neoplasm of prostate: Secondary | ICD-10-CM | POA: Diagnosis not present

## 2014-05-22 ENCOUNTER — Other Ambulatory Visit: Payer: Self-pay | Admitting: *Deleted

## 2014-05-22 DIAGNOSIS — C61 Malignant neoplasm of prostate: Secondary | ICD-10-CM

## 2014-05-22 MED ORDER — ENZALUTAMIDE 40 MG PO CAPS: 160.0000 mg | ORAL_CAPSULE | Freq: Every day | ORAL | Status: DC

## 2014-06-01 ENCOUNTER — Telehealth: Payer: Self-pay | Admitting: Oncology

## 2014-06-01 ENCOUNTER — Other Ambulatory Visit (HOSPITAL_BASED_OUTPATIENT_CLINIC_OR_DEPARTMENT_OTHER): Payer: Medicare Other

## 2014-06-01 ENCOUNTER — Ambulatory Visit (HOSPITAL_BASED_OUTPATIENT_CLINIC_OR_DEPARTMENT_OTHER): Payer: Medicare Other | Admitting: Oncology

## 2014-06-01 VITALS — BP 130/73 | HR 62 | Temp 97.5°F | Resp 18 | Ht 71.0 in | Wt 221.1 lb

## 2014-06-01 DIAGNOSIS — C61 Malignant neoplasm of prostate: Secondary | ICD-10-CM

## 2014-06-01 DIAGNOSIS — C7951 Secondary malignant neoplasm of bone: Secondary | ICD-10-CM

## 2014-06-01 LAB — COMPREHENSIVE METABOLIC PANEL (CC13)
ALBUMIN: 3.6 g/dL (ref 3.5–5.0)
ALK PHOS: 52 U/L (ref 40–150)
ALT: 13 U/L (ref 0–55)
AST: 15 U/L (ref 5–34)
Anion Gap: 9 mEq/L (ref 3–11)
BUN: 20.5 mg/dL (ref 7.0–26.0)
CO2: 24 mEq/L (ref 22–29)
Calcium: 8.7 mg/dL (ref 8.4–10.4)
Chloride: 107 mEq/L (ref 98–109)
Creatinine: 1.6 mg/dL — ABNORMAL HIGH (ref 0.7–1.3)
EGFR: 41 mL/min/{1.73_m2} — ABNORMAL LOW (ref >90)
Glucose: 87 mg/dl (ref 70–140)
Potassium: 4.8 mEq/L (ref 3.5–5.1)
SODIUM: 140 meq/L (ref 136–145)
TOTAL PROTEIN: 6.4 g/dL (ref 6.4–8.3)
Total Bilirubin: 0.42 mg/dL (ref 0.20–1.20)

## 2014-06-01 LAB — CBC WITH DIFFERENTIAL/PLATELET
BASO%: 0.8 % (ref 0.0–2.0)
Basophils Absolute: 0 10*3/uL (ref 0.0–0.1)
EOS ABS: 0.1 10*3/uL (ref 0.0–0.5)
EOS%: 1 % (ref 0.0–7.0)
HCT: 39.5 % (ref 38.4–49.9)
HGB: 12.7 g/dL — ABNORMAL LOW (ref 13.0–17.1)
LYMPH%: 22.1 % (ref 14.0–49.0)
MCH: 30.1 pg (ref 27.2–33.4)
MCHC: 32.2 g/dL (ref 32.0–36.0)
MCV: 93.6 fL (ref 79.3–98.0)
MONO#: 0.4 10*3/uL (ref 0.1–0.9)
MONO%: 6.8 % (ref 0.0–14.0)
NEUT%: 69.3 % (ref 39.0–75.0)
NEUTROS ABS: 4.1 10*3/uL (ref 1.5–6.5)
Platelets: 154 10*3/uL (ref 140–400)
RBC: 4.22 10*6/uL (ref 4.20–5.82)
RDW: 15.2 % — ABNORMAL HIGH (ref 11.0–14.6)
WBC: 5.9 10*3/uL (ref 4.0–10.3)
lymph#: 1.3 10*3/uL (ref 0.9–3.3)

## 2014-06-01 NOTE — Telephone Encounter (Signed)
, °

## 2014-06-01 NOTE — Progress Notes (Signed)
Hematology and Oncology Follow Up Visit  Jiovany Scheffel [id number] [birth year] 80 y.o. 06/01/2014 1:17 PM [name], MDPerini, Jeannette How, MD   Principle Diagnosis: 80 year old gentleman with castration resistant prostate cancer with metastatic disease to the bone as well as pelvic adenopathy. He was diagnosed in 2007 with a Gleason score 4+3 equals 7 and a PSA of 4.   Prior Therapy:  He received a definitive therapy with radiation therapy and developed biochemical relapse initially treated with androgen deprivation.  He developed bony metastasis including a frontal bone metastasis and was treated with radiation therapy in February of 2013. Most recently his PSA was up to 13.38 despite castration levels of testosterone. His most recent bone scan in June of 2015 showed progression of disease.   Current therapy: Xtandi 160 mg daily started in August of 2015.  Interim History: Mr. Halbig presents today for a followup visit. Since his last visit, he did used to do well without any new complaints. He continues to be on Xtandi  and have tolerated it well. He does report occasional mild nausea but no vomiting. His nausea last few hours and easily manageable. He did have mild fatigue which is not problematic for him. He continues to be active and performs activities of daily living. He does not report any seizure activity or changes in his performance status.  He did have a skin lesion removed from his leg by Dr. Allyson Sabal.  He does not report any headaches or blurry vision or double vision. Does not report any syncope or seizures.He does report some occasional nausea. But really no constitutional symptoms. He does not report any fevers or chills or sweats or weight loss. Appetite and performance status remained stable. Is that report any chest pain shortness of breath or cough. As that report any hemoptysis or hematemesis. Does not report any leg edema or palpitation. Does not report any frequency urgency or  hesitancy. Does not report any skeletal complaints back pain shoulder pain or hip pain. Does not report any arthralgias or myalgias. He does not report any rashes or lesions or petechiae. Remainder of his review of systems unremarkable   Medications:   Current Outpatient Prescriptions  Medication Sig Dispense Refill  . allopurinol (ZYLOPRIM) 100 MG tablet Take 100 mg by mouth daily.    Marland Kitchen atorvastatin (LIPITOR) 20 MG tablet Take 20 mg by mouth daily.  4  . diphenhydrAMINE (BENADRYL) 25 MG tablet Take 25 mg by mouth every 6 (six) hours as needed.    . enzalutamide (XTANDI) 40 MG capsule Take 4 capsules (160 mg total) by mouth daily. 120 capsule 0  . omeprazole (PRILOSEC) 20 MG capsule Take 20 mg by mouth daily.    . ondansetron (ZOFRAN-ODT) 4 MG disintegrating tablet Take 4 mg by mouth every 8 (eight) hours as needed for nausea or vomiting.    . promethazine (PHENERGAN) 12.5 MG tablet Take 12.5 mg by mouth every 6 (six) hours as needed. for nausea  1  . simethicone (MYLICON) 80 MG chewable tablet Chew 80 mg by mouth every 6 (six) hours as needed.    . Tamsulosin HCl (FLOMAX) 0.4 MG CAPS Take by mouth.    . Vitamin D, Cholecalciferol, 400 UNITS CHEW Chew 800 Units by mouth daily.     No current facility-administered medications for this visit.     Allergies:  Allergies  Allergen Reactions  . Antihistamines, Chlorpheniramine-Type     Past Medical History, Surgical history, Social history, and Family History were reviewed and  updated.   Physical Exam: Blood pressure 130/73, pulse 62, temperature 97.5 F (36.4 C), temperature source Oral, resp. rate 18, height 5\' 11"  (1.803 m), weight 221 lb 1.6 oz (100.29 kg), SpO2 100 %. ECOG: 1 General appearance: alert and cooperative Head: Normocephalic, without obvious abnormality Neck: no adenopathy Lymph nodes: Cervical, supraclavicular, and axillary nodes normal. Heart:regular rate and rhythm, S1, S2 normal, no murmur, click, rub or  gallop Lung:chest clear, no wheezing, rales, normal symmetric air entry Abdomin: soft, non-tender, without masses or organomegaly EXT:no erythema, induration, or nodules Skin: No lesion removed from his leg without any other rashes or lesions.  Lab Results: Lab Results  Component Value Date   WBC 5.9 06/01/2014   HGB 12.7* 06/01/2014   HCT 39.5 06/01/2014   MCV 93.6 06/01/2014   PLT 154 06/01/2014     Chemistry      Component Value Date/Time   NA 140 05/01/2014 1501   NA 135 01/23/2014 1055   K 4.1 05/01/2014 1501   K 4.7 01/23/2014 1055   CL 102 01/23/2014 1055   CO2 25 05/01/2014 1501   CO2 27 01/23/2014 1055   BUN 20.7 05/01/2014 1501   BUN 24* 01/23/2014 1055   CREATININE 1.7* 05/01/2014 1501   CREATININE 1.60* 01/23/2014 1055      Component Value Date/Time   CALCIUM 8.8 05/01/2014 1501   CALCIUM 9.0 01/23/2014 1055   ALKPHOS 52 05/01/2014 1501   ALKPHOS 53 01/23/2014 1055   AST 14 05/01/2014 1501   AST 18 01/23/2014 1055   ALT 9 05/01/2014 1501   ALT 16 01/23/2014 1055   BILITOT 0.39 05/01/2014 1501   BILITOT 0.4 01/23/2014 1055       Results for BENZION, MESTA (MRN [id number]) as of 06/01/2014 12:59  Ref. Range 02/27/2014 14:54 04/03/2014 09:45 05/01/2014 15:01  PSA Latest Range: <=4.00 ng/mL 5.18 (H) 4.82 (H) 4.92 (H)   Impression and Plan:  80 year old with:  1. Castration resistant prostate cancer with metastatic disease to the bone. His initial diagnosis was 2007 with a Gleason score 4+3 equal 7 and a PSA of 4. He is currently on Xtandi and has tolerated it well. His PSA dropped from 13.38 down to 4.92. He is tolerating this medication well and plan on to continue with the same dose and schedule.  2. Androgen depravation: Recommended  that he continues on the current androgen depravation therapy. He is receiving [facility].  3. Bone directed therapy: He is currently on Xgeva an we recommend for him to continue monthly. He is receiving it at  [facility]. Continue to educate him about osteonecrosis of the job and other complications.  4. Followup: Will be in one month for reevaluation.      Zola Button, MD  12/18/20151:17 PM

## 2014-06-02 LAB — PSA: PSA: 4.15 ng/mL — ABNORMAL HIGH (ref <=4.00)

## 2014-06-19 DIAGNOSIS — C61 Malignant neoplasm of prostate: Secondary | ICD-10-CM | POA: Diagnosis not present

## 2014-06-19 DIAGNOSIS — C7951 Secondary malignant neoplasm of bone: Secondary | ICD-10-CM | POA: Diagnosis not present

## 2014-06-21 ENCOUNTER — Other Ambulatory Visit: Payer: Self-pay | Admitting: Oncology

## 2014-06-21 DIAGNOSIS — C61 Malignant neoplasm of prostate: Secondary | ICD-10-CM

## 2014-06-29 ENCOUNTER — Other Ambulatory Visit (HOSPITAL_BASED_OUTPATIENT_CLINIC_OR_DEPARTMENT_OTHER): Payer: Medicare Other

## 2014-06-29 ENCOUNTER — Ambulatory Visit (HOSPITAL_BASED_OUTPATIENT_CLINIC_OR_DEPARTMENT_OTHER): Payer: Medicare Other | Admitting: Physician Assistant

## 2014-06-29 ENCOUNTER — Telehealth: Payer: Self-pay | Admitting: Physician Assistant

## 2014-06-29 ENCOUNTER — Encounter: Payer: Self-pay | Admitting: Physician Assistant

## 2014-06-29 VITALS — BP 126/78 | HR 74 | Temp 98.0°F | Resp 18 | Ht 71.0 in | Wt 219.3 lb

## 2014-06-29 DIAGNOSIS — C61 Malignant neoplasm of prostate: Secondary | ICD-10-CM | POA: Diagnosis not present

## 2014-06-29 DIAGNOSIS — C7951 Secondary malignant neoplasm of bone: Secondary | ICD-10-CM

## 2014-06-29 LAB — COMPREHENSIVE METABOLIC PANEL (CC13)
ALBUMIN: 3.7 g/dL (ref 3.5–5.0)
ALT: 12 U/L (ref 0–55)
AST: 15 U/L (ref 5–34)
Alkaline Phosphatase: 51 U/L (ref 40–150)
Anion Gap: 8 mEq/L (ref 3–11)
BUN: 21 mg/dL (ref 7.0–26.0)
CO2: 26 mEq/L (ref 22–29)
Calcium: 8.9 mg/dL (ref 8.4–10.4)
Chloride: 105 mEq/L (ref 98–109)
Creatinine: 1.5 mg/dL — ABNORMAL HIGH (ref 0.7–1.3)
EGFR: 44 mL/min/{1.73_m2} — ABNORMAL LOW (ref >90)
Glucose: 103 mg/dl (ref 70–140)
Potassium: 5 mEq/L (ref 3.5–5.1)
Sodium: 139 mEq/L (ref 136–145)
Total Bilirubin: 0.48 mg/dL (ref 0.20–1.20)
Total Protein: 6.6 g/dL (ref 6.4–8.3)

## 2014-06-29 LAB — CBC WITH DIFFERENTIAL/PLATELET
BASO%: 0.3 % (ref 0.0–2.0)
BASOS ABS: 0 10*3/uL (ref 0.0–0.1)
EOS%: 1.2 % (ref 0.0–7.0)
Eosinophils Absolute: 0.1 10*3/uL (ref 0.0–0.5)
HCT: 40.1 % (ref 38.4–49.9)
HGB: 13.5 g/dL (ref 13.0–17.1)
LYMPH#: 1.3 10*3/uL (ref 0.9–3.3)
LYMPH%: 19.8 % (ref 14.0–49.0)
MCH: 31.3 pg (ref 27.2–33.4)
MCHC: 33.7 g/dL (ref 32.0–36.0)
MCV: 92.8 fL (ref 79.3–98.0)
MONO#: 0.4 10*3/uL (ref 0.1–0.9)
MONO%: 6.2 % (ref 0.0–14.0)
NEUT#: 4.8 10*3/uL (ref 1.5–6.5)
NEUT%: 72.5 % (ref 39.0–75.0)
Platelets: 143 10*3/uL (ref 140–400)
RBC: 4.32 10*6/uL (ref 4.20–5.82)
RDW: 14 % (ref 11.0–14.6)
WBC: 6.6 10*3/uL (ref 4.0–10.3)

## 2014-06-29 NOTE — Telephone Encounter (Signed)
Gave avs & cal. No pof

## 2014-06-29 NOTE — Progress Notes (Signed)
Hematology and Oncology Follow Up Visit  Eric Deleon [id number] June 18, 1931 81 y.o. 06/29/2014 2:56 PM Eric Deleon, MDPerini, Jeannette How, MD   Principle Diagnosis: 81 year old gentleman with castration resistant prostate cancer with metastatic disease to the bone as well as pelvic adenopathy. He was diagnosed in 2007 with a Gleason score 4+3 equals 7 and a PSA of 4.   Prior Therapy:  He received a definitive therapy with radiation therapy and developed biochemical relapse initially treated with androgen deprivation.  He developed bony metastasis including a frontal bone metastasis and was treated with radiation therapy in February of 2013. Most recently his PSA was up to 13.38 despite castration levels of testosterone. His most recent bone scan in June of 2015 showed progression of disease.   Current therapy: Xtandi 160 mg daily started in August of 2015.  Interim History: Mr. Eric Deleon presents today for a followup visit accompanied by his wife. Since his last visit, he continues to do well without any new complaints. He continues to be on Xtandi  and has tolerated it well. He does report occasional mild nausea but no vomiting. His nausea last few hours and easily manageable.  He continues to be active and performs activities of daily living. He does not report any seizure activity or changes in his performance status.  He did have a skin lesion removed from his leg by Dr. Allyson Deleon in December 2015. He reports no other suspicious lesions. He does not report any headaches or blurry vision or double vision. Does not report any syncope or seizures. Denies any constitutional symptoms. He does not report any fevers or chills or sweats or weight loss. Appetite and performance status remain stable. Does nott report any chest pain shortness of breath or cough. As that report any hemoptysis or hematemesis. Does not report any leg edema or palpitation. Does not report any frequency urgency or hesitancy. Does not  report any skeletal complaints back pain shoulder pain or hip pain. Does not report any arthralgias or myalgias. He does not report any rashes or lesions or petechiae. Remainder of his review of systems unremarkable   Medications:   Current Outpatient Prescriptions  Medication Sig Dispense Refill  . allopurinol (ZYLOPRIM) 300 MG tablet Take 300 mg by mouth daily.  3  . atorvastatin (LIPITOR) 20 MG tablet Take 20 mg by mouth daily.  4  . diphenhydrAMINE (BENADRYL) 25 MG tablet Take 25 mg by mouth every 6 (six) hours as needed.    Marland Kitchen omeprazole (PRILOSEC) 20 MG capsule Take 20 mg by mouth daily.    . simethicone (MYLICON) 80 MG chewable tablet Chew 80 mg by mouth every 6 (six) hours as needed.    . Tamsulosin HCl (FLOMAX) 0.4 MG CAPS Take by mouth.    . Vitamin D, Cholecalciferol, 400 UNITS CHEW Chew 800 Units by mouth daily.    [name] 40 MG capsule TAKE 4 CAPSULES BY MOUTH DAILY. 120 capsule 0  . ondansetron (ZOFRAN-ODT) 4 MG disintegrating tablet Take 4 mg by mouth every 8 (eight) hours as needed for nausea or vomiting.    . promethazine (PHENERGAN) 12.5 MG tablet Take 12.5 mg by mouth every 6 (six) hours as needed. for nausea  1   No current facility-administered medications for this visit.     Allergies:  Allergies  Allergen Reactions  . Antihistamines, Chlorpheniramine-Type     Past Medical History, Surgical history, Social history, and Family History were reviewed and updated.   Physical Exam: Blood pressure 126/78, pulse  74, temperature 98 F (36.7 C), temperature source Oral, resp. rate 18, height 5\' 11"  (1.803 m), weight 219 lb 4.8 oz (99.474 kg), SpO2 100 %. ECOG: 1 General appearance: alert and cooperative Head: Normocephalic, without obvious abnormality Neck: no adenopathy Lymph nodes: Cervical, supraclavicular, and axillary nodes normal. Heart:regular rate and rhythm, S1, S2 normal, no murmur, click, rub or gallop Lung:chest clear, no wheezing, rales, normal  symmetric air entry Abdomin: soft, non-tender, without masses or organomegaly EXT:no erythema, induration, or nodules Skin:  lesion removed from his left lower leg without any other rashes or lesions.  Lab Results: Lab Results  Component Value Date   WBC 6.6 06/29/2014   HGB 13.5 06/29/2014   HCT 40.1 06/29/2014   MCV 92.8 06/29/2014   PLT 143 06/29/2014     Chemistry      Component Value Date/Time   NA 139 06/29/2014 1129   NA 135 01/23/2014 1055   K 5.0 06/29/2014 1129   K 4.7 01/23/2014 1055   CL 102 01/23/2014 1055   CO2 26 06/29/2014 1129   CO2 27 01/23/2014 1055   BUN 21.0 06/29/2014 1129   BUN 24* 01/23/2014 1055   CREATININE 1.5* 06/29/2014 1129   CREATININE 1.60* 01/23/2014 1055      Component Value Date/Time   CALCIUM 8.9 06/29/2014 1129   CALCIUM 9.0 01/23/2014 1055   ALKPHOS 51 06/29/2014 1129   ALKPHOS 53 01/23/2014 1055   AST 15 06/29/2014 1129   AST 18 01/23/2014 1055   ALT 12 06/29/2014 1129   ALT 16 01/23/2014 1055   BILITOT 0.48 06/29/2014 1129   BILITOT 0.4 01/23/2014 1055       Results for Eric Deleon (MRN [id number]) as of 06/01/2014 12:59  Ref. Range 02/27/2014 14:54 04/03/2014 09:45 05/01/2014 15:01  PSA Latest Range: <=4.00 ng/mL 5.18 (H) 4.82 (H) 4.92 (H)   Impression and Plan:  81 year old with:  1. Castration resistant prostate cancer with metastatic disease to the bone. His initial diagnosis was 2007 with a Gleason score 4+3 equal 7 and a PSA of 4. He is currently on Xtandi and has tolerated it well. His PSA dropped from 13.38  nowdown to 4.15. He is tolerating this medication well and plan on to continue with the same dose and schedule.  2. Androgen depravation: Recommended  that he continues on the current androgen depravation therapy. He is receiving this at [facility].  3. Bone directed therapy: He is currently on Xgeva an we recommend for him to continue monthly. He is receiving it at [facility]. Continue to educate  him about osteonecrosis of the job and other complications.  4. Followup: Will be in one month for reevaluation.     Eric Deleon E, PA-C  1/15/20162:56 PM

## 2014-06-29 NOTE — Patient Instructions (Signed)
Continue Xtandi 160 mg by mouth daily Follow up in one month

## 2014-06-30 LAB — PSA: PSA: 3.84 ng/mL (ref <=4.00)

## 2014-07-13 DIAGNOSIS — C61 Malignant neoplasm of prostate: Secondary | ICD-10-CM | POA: Diagnosis not present

## 2014-07-20 DIAGNOSIS — C7951 Secondary malignant neoplasm of bone: Secondary | ICD-10-CM | POA: Diagnosis not present

## 2014-07-20 DIAGNOSIS — C61 Malignant neoplasm of prostate: Secondary | ICD-10-CM | POA: Diagnosis not present

## 2014-07-24 ENCOUNTER — Other Ambulatory Visit: Payer: Self-pay | Admitting: *Deleted

## 2014-07-24 ENCOUNTER — Other Ambulatory Visit: Payer: Self-pay | Admitting: Oncology

## 2014-07-24 ENCOUNTER — Encounter: Payer: Self-pay | Admitting: *Deleted

## 2014-07-24 DIAGNOSIS — C61 Malignant neoplasm of prostate: Secondary | ICD-10-CM

## 2014-07-24 MED ORDER — ENZALUTAMIDE 40 MG PO CAPS: 160.0000 mg | ORAL_CAPSULE | Freq: Every day | ORAL | Status: DC

## 2014-08-01 ENCOUNTER — Other Ambulatory Visit (HOSPITAL_BASED_OUTPATIENT_CLINIC_OR_DEPARTMENT_OTHER): Payer: Medicare Other

## 2014-08-01 ENCOUNTER — Ambulatory Visit (HOSPITAL_BASED_OUTPATIENT_CLINIC_OR_DEPARTMENT_OTHER): Payer: Medicare Other | Admitting: Oncology

## 2014-08-01 ENCOUNTER — Telehealth: Payer: Self-pay | Admitting: Oncology

## 2014-08-01 VITALS — BP 118/71 | HR 80 | Temp 97.6°F | Resp 19 | Ht 71.0 in | Wt 218.3 lb

## 2014-08-01 DIAGNOSIS — E291 Testicular hypofunction: Secondary | ICD-10-CM

## 2014-08-01 DIAGNOSIS — C7951 Secondary malignant neoplasm of bone: Secondary | ICD-10-CM | POA: Diagnosis not present

## 2014-08-01 DIAGNOSIS — C61 Malignant neoplasm of prostate: Secondary | ICD-10-CM

## 2014-08-01 LAB — CBC WITH DIFFERENTIAL/PLATELET
BASO%: 0.4 % (ref 0.0–2.0)
Basophils Absolute: 0 10*3/uL (ref 0.0–0.1)
EOS%: 1.2 % (ref 0.0–7.0)
Eosinophils Absolute: 0.1 10*3/uL (ref 0.0–0.5)
HEMATOCRIT: 40.5 % (ref 38.4–49.9)
HGB: 13.4 g/dL (ref 13.0–17.1)
LYMPH#: 1.1 10*3/uL (ref 0.9–3.3)
LYMPH%: 23.7 % (ref 14.0–49.0)
MCH: 30.9 pg (ref 27.2–33.4)
MCHC: 33.1 g/dL (ref 32.0–36.0)
MCV: 93.5 fL (ref 79.3–98.0)
MONO#: 0.3 10*3/uL (ref 0.1–0.9)
MONO%: 6.2 % (ref 0.0–14.0)
NEUT#: 3.3 10*3/uL (ref 1.5–6.5)
NEUT%: 68.5 % (ref 39.0–75.0)
PLATELETS: 145 10*3/uL (ref 140–400)
RBC: 4.33 10*6/uL (ref 4.20–5.82)
RDW: 14.1 % (ref 11.0–14.6)
WBC: 4.8 10*3/uL (ref 4.0–10.3)

## 2014-08-01 LAB — COMPREHENSIVE METABOLIC PANEL (CC13)
ALT: 11 U/L (ref 0–55)
ANION GAP: 10 meq/L (ref 3–11)
AST: 15 U/L (ref 5–34)
Albumin: 3.6 g/dL (ref 3.5–5.0)
Alkaline Phosphatase: 50 U/L (ref 40–150)
BUN: 16.9 mg/dL (ref 7.0–26.0)
CALCIUM: 8.7 mg/dL (ref 8.4–10.4)
CHLORIDE: 105 meq/L (ref 98–109)
CO2: 26 meq/L (ref 22–29)
CREATININE: 1.6 mg/dL — AB (ref 0.7–1.3)
EGFR: 39 mL/min/{1.73_m2} — ABNORMAL LOW (ref >90)
GLUCOSE: 112 mg/dL (ref 70–140)
Potassium: 4.3 mEq/L (ref 3.5–5.1)
Sodium: 141 mEq/L (ref 136–145)
TOTAL PROTEIN: 6.4 g/dL (ref 6.4–8.3)
Total Bilirubin: 0.41 mg/dL (ref 0.20–1.20)

## 2014-08-01 NOTE — Progress Notes (Signed)
Hematology and Oncology Follow Up Visit  Eric Deleon [id number] [birth year] 81 y.o. 08/01/2014 12:03 PM Eric Deleon, MDPerini, Eric How, MD   Principle Diagnosis: 81 year old gentleman with castration resistant prostate cancer with metastatic disease to the bone as well as pelvic adenopathy. He was diagnosed in 2007 with a Gleason score 4+3 equals 7 and a PSA of 4.   Prior Therapy:  He received a definitive therapy with radiation therapy and developed biochemical relapse initially treated with androgen deprivation.  He developed bony metastasis including a frontal bone metastasis and was treated with radiation therapy in February of 2013. Most recently his PSA was up to 13.38 despite castration levels of testosterone. His most recent bone scan in June of 2015 showed progression of disease.   Current therapy: Xtandi 160 mg daily started in August of 2015.  Interim History: Eric Deleon presents today for a followup visit accompanied by his wife. Since his last visit, he does not report any new issues. He continues to be on Xtandi  and has tolerated it well. He does report occasional mild nausea but no vomiting but have adjusted to these symptoms reasonably well. He continues to exercise regularly and have lost 20 pounds intentionally. He continues to be active and performs activities of daily living. He does not report any seizure activity or changes in his performance status.   He does not report any headaches or blurry vision or double vision. Does not report any syncope or seizures. Denies any constitutional symptoms. He does not report any fevers or chills or sweats or weight loss. Appetite and performance status remain stable. Does nott report any chest pain shortness of breath or cough. As that report any hemoptysis or hematemesis. Does not report any leg edema or palpitation. Does not report any frequency urgency or hesitancy. Does not report any skeletal complaints back pain shoulder pain or hip  pain. Does not report any arthralgias or myalgias. He does not report any rashes or lesions or petechiae. Remainder of his review of systems unremarkable   Medications:   Current Outpatient Prescriptions  Medication Sig Dispense Refill  . allopurinol (ZYLOPRIM) 300 MG tablet Take 300 mg by mouth daily.  3  . atorvastatin (LIPITOR) 20 MG tablet Take 20 mg by mouth daily.  4  . diphenhydrAMINE (BENADRYL) 25 MG tablet Take 25 mg by mouth every 6 (six) hours as needed.    Marland Kitchen omeprazole (PRILOSEC) 20 MG capsule Take 20 mg by mouth daily.    . ondansetron (ZOFRAN-ODT) 4 MG disintegrating tablet Take 4 mg by mouth every 8 (eight) hours as needed for nausea or vomiting.    . promethazine (PHENERGAN) 12.5 MG tablet Take 12.5 mg by mouth every 6 (six) hours as needed. for nausea  1  . simethicone (MYLICON) 80 MG chewable tablet Chew 80 mg by mouth every 6 (six) hours as needed.    . Tamsulosin HCl (FLOMAX) 0.4 MG CAPS Take by mouth.    . Vitamin D, Cholecalciferol, 400 UNITS CHEW Chew 800 Units by mouth daily.    [name] 40 MG capsule TAKE 4 CAPSULES BY MOUTH DAILY. 120 capsule 0   No current facility-administered medications for this visit.     Allergies:  Allergies  Allergen Reactions  . Antihistamines, Chlorpheniramine-Type     Past Medical History, Surgical history, Social history, and Family History were reviewed and updated.   Physical Exam: Blood pressure 118/71, pulse 80, temperature 97.6 F (36.4 C), temperature source Oral, resp. rate 19, height  5\' 11"  (1.803 m), weight 218 lb 4.8 oz (99.02 kg), SpO2 98 %. ECOG: 1 General appearance: alert and cooperative not in any distress. Head: Normocephalic, without obvious abnormality Neck: no adenopathy Lymph nodes: Cervical, supraclavicular, and axillary nodes normal. Heart:regular rate and rhythm, S1, S2 normal, no murmur, click, rub or gallop Lung:chest clear, no wheezing, rales, normal symmetric air entry Abdomin: soft, non-tender,  without masses or organomegaly EXT:no erythema, induration, or nodules Skin:  No rashes or lesions.  Lab Results: Lab Results  Component Value Date   WBC 4.8 08/01/2014   HGB 13.4 08/01/2014   HCT 40.5 08/01/2014   MCV 93.5 08/01/2014   PLT 145 08/01/2014     Chemistry      Component Value Date/Time   NA 139 06/29/2014 1129   NA 135 01/23/2014 1055   K 5.0 06/29/2014 1129   K 4.7 01/23/2014 1055   CL 102 01/23/2014 1055   CO2 26 06/29/2014 1129   CO2 27 01/23/2014 1055   BUN 21.0 06/29/2014 1129   BUN 24* 01/23/2014 1055   CREATININE 1.5* 06/29/2014 1129   CREATININE 1.60* 01/23/2014 1055      Component Value Date/Time   CALCIUM 8.9 06/29/2014 1129   CALCIUM 9.0 01/23/2014 1055   ALKPHOS 51 06/29/2014 1129   ALKPHOS 53 01/23/2014 1055   AST 15 06/29/2014 1129   AST 18 01/23/2014 1055   ALT 12 06/29/2014 1129   ALT 16 01/23/2014 1055   BILITOT 0.48 06/29/2014 1129   BILITOT 0.4 01/23/2014 1055      Results for Eric, Deleon (MRN [id number]) as of 08/01/2014 11:47  Ref. Range 05/01/2014 15:01 06/01/2014 12:29 06/29/2014 11:29  PSA Latest Range: <=4.00 ng/mL 4.92 (H) 4.15 (H) 3.84     Impression and Plan:  81 year old with:  1. Castration resistant prostate cancer with metastatic disease to the bone. His initial diagnosis was 2007 with a Gleason score 4+3 equal 7 and a PSA of 4. He is currently on Xtandi and has tolerated it well. His PSA dropped from 13.38  nowdown to 3.84. He is tolerating this medication well and plan on to continue with the same dose and schedule. I see no need for any dose reduction or delay.  2. Androgen depravation: Recommended  that he continues on the current androgen depravation therapy. He is receiving this at [facility] urology.  3. Bone directed therapy: He is currently on Xgeva an we recommend for him to continue monthly. He is receiving it at [facility] urology. Continue to educate him about osteonecrosis of the job and other  complications.  4. Followup: Will be in one month for reevaluation.     [facility], MD 2/17/201612:03 PM

## 2014-08-01 NOTE — Telephone Encounter (Signed)
Pt confirmed labs/ov per 02/17 POF, gave pt AVS..... KJ

## 2014-08-02 LAB — PSA: PSA: 3.13 ng/mL (ref <=4.00)

## 2014-08-21 ENCOUNTER — Other Ambulatory Visit: Payer: Self-pay | Admitting: Oncology

## 2014-08-21 DIAGNOSIS — C61 Malignant neoplasm of prostate: Secondary | ICD-10-CM | POA: Diagnosis not present

## 2014-08-21 DIAGNOSIS — C7951 Secondary malignant neoplasm of bone: Secondary | ICD-10-CM | POA: Diagnosis not present

## 2014-09-03 ENCOUNTER — Telehealth: Payer: Self-pay | Admitting: Oncology

## 2014-09-03 ENCOUNTER — Ambulatory Visit (HOSPITAL_BASED_OUTPATIENT_CLINIC_OR_DEPARTMENT_OTHER): Payer: Medicare Other | Admitting: Oncology

## 2014-09-03 ENCOUNTER — Other Ambulatory Visit (HOSPITAL_BASED_OUTPATIENT_CLINIC_OR_DEPARTMENT_OTHER): Payer: Medicare Other

## 2014-09-03 ENCOUNTER — Encounter: Payer: Self-pay | Admitting: Oncology

## 2014-09-03 VITALS — BP 125/77 | HR 84 | Temp 98.3°F | Wt 216.8 lb

## 2014-09-03 DIAGNOSIS — C61 Malignant neoplasm of prostate: Secondary | ICD-10-CM

## 2014-09-03 DIAGNOSIS — E291 Testicular hypofunction: Secondary | ICD-10-CM | POA: Diagnosis not present

## 2014-09-03 DIAGNOSIS — C7951 Secondary malignant neoplasm of bone: Secondary | ICD-10-CM | POA: Diagnosis not present

## 2014-09-03 LAB — CBC WITH DIFFERENTIAL/PLATELET
BASO%: 0.9 % (ref 0.0–2.0)
Basophils Absolute: 0.1 10*3/uL (ref 0.0–0.1)
EOS%: 1.5 % (ref 0.0–7.0)
Eosinophils Absolute: 0.1 10*3/uL (ref 0.0–0.5)
HCT: 43.6 % (ref 38.4–49.9)
HEMOGLOBIN: 14.1 g/dL (ref 13.0–17.1)
LYMPH#: 1.4 10*3/uL (ref 0.9–3.3)
LYMPH%: 22.7 % (ref 14.0–49.0)
MCH: 30.2 pg (ref 27.2–33.4)
MCHC: 32.2 g/dL (ref 32.0–36.0)
MCV: 93.7 fL (ref 79.3–98.0)
MONO#: 0.4 10*3/uL (ref 0.1–0.9)
MONO%: 5.8 % (ref 0.0–14.0)
NEUT#: 4.2 10*3/uL (ref 1.5–6.5)
NEUT%: 69.1 % (ref 39.0–75.0)
Platelets: 160 10*3/uL (ref 140–400)
RBC: 4.65 10*6/uL (ref 4.20–5.82)
RDW: 14.8 % — ABNORMAL HIGH (ref 11.0–14.6)
WBC: 6 10*3/uL (ref 4.0–10.3)

## 2014-09-03 LAB — COMPREHENSIVE METABOLIC PANEL (CC13)
ALBUMIN: 3.7 g/dL (ref 3.5–5.0)
ALT: 12 U/L (ref 0–55)
ANION GAP: 10 meq/L (ref 3–11)
AST: 14 U/L (ref 5–34)
Alkaline Phosphatase: 46 U/L (ref 40–150)
BILIRUBIN TOTAL: 0.49 mg/dL (ref 0.20–1.20)
BUN: 17.9 mg/dL (ref 7.0–26.0)
CO2: 23 meq/L (ref 22–29)
Calcium: 8.6 mg/dL (ref 8.4–10.4)
Chloride: 104 mEq/L (ref 98–109)
Creatinine: 1.5 mg/dL — ABNORMAL HIGH (ref 0.7–1.3)
EGFR: 43 mL/min/{1.73_m2} — ABNORMAL LOW (ref >90)
GLUCOSE: 111 mg/dL (ref 70–140)
POTASSIUM: 4.6 meq/L (ref 3.5–5.1)
SODIUM: 138 meq/L (ref 136–145)
TOTAL PROTEIN: 6.6 g/dL (ref 6.4–8.3)

## 2014-09-03 MED ORDER — ENZALUTAMIDE 40 MG PO CAPS: 160.0000 mg | ORAL_CAPSULE | Freq: Every day | ORAL | Status: DC

## 2014-09-03 NOTE — Progress Notes (Signed)
Hematology and Oncology Follow Up Visit  Eric Deleon [id number] 05/17/1932 81 y.o. 09/03/2014 11:28 AM [name], MDPerini, [name], MD   Principle Diagnosis: 81 year old gentleman with castration resistant prostate cancer with metastatic disease to the bone as well as pelvic adenopathy. He was diagnosed in 2007 with a Gleason score 4+3 equals 7 and a PSA of 4.   Prior Therapy:  He received a definitive therapy with radiation therapy and developed biochemical relapse initially treated with androgen deprivation.  He developed bony metastasis including a frontal bone metastasis and was treated with radiation therapy in February of 2013. Most recently his PSA was up to 13.38 despite castration levels of testosterone. His most recent bone scan in June of 2015 showed progression of disease.   Current therapy: Xtandi 160 mg daily started in August of 2015.  Interim History: Eric Deleon presents today for a followup visit accompanied by his wife. Since his last visit, he does not report any new issues. He continues to be on Xtandi  and has tolerated it well. He does report occasional mild nausea but no vomiting but have adjusted to these symptoms reasonably well. He continues to exercise regularly and have lost 20 pounds intentionally. He continues to be active and performs activities of daily living. He does not report any seizure activity or changes in his performance status.   He does not report any headaches or blurry vision or double vision. Does not report any syncope or seizures. Denies any constitutional symptoms. He does not report any fevers or chills or sweats or weight loss. Appetite and performance status remain stable. Does not report any chest pain shortness of breath or cough. As that report any hemoptysis or hematemesis. Does not report any leg edema or palpitation. Does not report any frequency urgency or hesitancy. Does not report any skeletal complaints back pain shoulder pain or hip  pain. Does not report any arthralgias or myalgias. He does not report any rashes or lesions or petechiae. Remainder of his review of systems unremarkable   Medications:   Current Outpatient Prescriptions  Medication Sig Dispense Refill  . allopurinol (ZYLOPRIM) 300 MG tablet Take 300 mg by mouth daily.  3  . atorvastatin (LIPITOR) 20 MG tablet Take 20 mg by mouth daily.  4  . diphenhydrAMINE (BENADRYL) 25 MG tablet Take 25 mg by mouth every 6 (six) hours as needed.    Marland Kitchen omeprazole (PRILOSEC) 20 MG capsule Take 20 mg by mouth daily.    . ondansetron (ZOFRAN-ODT) 4 MG disintegrating tablet Take 4 mg by mouth every 8 (eight) hours as needed for nausea or vomiting.    . promethazine (PHENERGAN) 12.5 MG tablet Take 12.5 mg by mouth every 6 (six) hours as needed. for nausea  1  . simethicone (MYLICON) 80 MG chewable tablet Chew 80 mg by mouth every 6 (six) hours as needed.    . Tamsulosin HCl (FLOMAX) 0.4 MG CAPS Take by mouth.    . Vitamin D, Cholecalciferol, 400 UNITS CHEW Chew 800 Units by mouth daily.    [name] 40 MG capsule TAKE 4 CAPSULES BY MOUTH DAILY 120 capsule 0   No current facility-administered medications for this visit.     Allergies:  Allergies  Allergen Reactions  . Antihistamines, Chlorpheniramine-Type     Past Medical History, Surgical history, Social history, and Family History were reviewed and updated.   Physical Exam: Blood pressure 125/77, pulse 84, temperature 98.3 F (36.8 C), temperature source Oral, weight 216 lb 12.8 oz (  98.34 kg), SpO2 94 %. ECOG: 1 General appearance: alert and cooperative not in any distress. Head: Normocephalic, without obvious abnormality Neck: no adenopathy Lymph nodes: Cervical, supraclavicular, and axillary nodes normal. Heart:regular rate and rhythm, S1, S2 normal, no murmur, click, rub or gallop Lung:chest clear, no wheezing, rales, normal symmetric air entry Abdomen: soft, non-tender, without masses or organomegaly EXT:no  erythema, induration, or nodules Skin:  No rashes or lesions.  Lab Results: Lab Results  Component Value Date   WBC 6.0 09/03/2014   HGB 14.1 09/03/2014   HCT 43.6 09/03/2014   MCV 93.7 09/03/2014   PLT 160 09/03/2014     Chemistry      Component Value Date/Time   NA 138 09/03/2014 1039   NA 135 01/23/2014 1055   K 4.6 09/03/2014 1039   K 4.7 01/23/2014 1055   CL 102 01/23/2014 1055   CO2 23 09/03/2014 1039   CO2 27 01/23/2014 1055   BUN 17.9 09/03/2014 1039   BUN 24* 01/23/2014 1055   CREATININE 1.5* 09/03/2014 1039   CREATININE 1.60* 01/23/2014 1055      Component Value Date/Time   CALCIUM 8.6 09/03/2014 1039   CALCIUM 9.0 01/23/2014 1055   ALKPHOS 46 09/03/2014 1039   ALKPHOS 53 01/23/2014 1055   AST 14 09/03/2014 1039   AST 18 01/23/2014 1055   ALT 12 09/03/2014 1039   ALT 16 01/23/2014 1055   BILITOT 0.49 09/03/2014 1039   BILITOT 0.4 01/23/2014 1055      Results for Eric Deleon (MRN [id number]) as of 09/03/2014 16:14  Ref. Range 04/03/2014 09:45 05/01/2014 15:01 06/01/2014 12:29 06/29/2014 11:29 08/01/2014 11:17  PSA Latest Range: <=4.00 ng/mL 4.82 (H) 4.92 (H) 4.15 (H) 3.84 3.13     Impression and Plan:  81 year old with:  1. Castration resistant prostate cancer with metastatic disease to the bone. His initial diagnosis was 2007 with a Gleason score 4+3 equal 7 and a PSA of 4. He is currently on Xtandi and has tolerated it well. His PSA dropped from 13.38  nowdown to 3.13. He is tolerating this medication well and plan on to continue with the same dose and schedule. I see no need for any dose reduction or delay.  2. Androgen depravation: Recommended  that he continues on the current androgen depravation therapy. He is receiving this at [facility] urology.  3. Bone directed therapy: He is currently on Xgeva an we recommend for him to continue monthly. He is receiving it at [facility] urology. Continue to educate him about osteonecrosis of the job and other  complications.  4. Followup: Will be in one month for reevaluation.     Eric Bussing, DNP, AGPCNP-BC, AOCNP 3/21/201611:28 AM

## 2014-09-03 NOTE — Telephone Encounter (Signed)
Gave avs & calendar for April. °

## 2014-09-04 LAB — PSA: PSA: 3.35 ng/mL (ref <=4.00)

## 2014-09-21 DIAGNOSIS — C7951 Secondary malignant neoplasm of bone: Secondary | ICD-10-CM | POA: Diagnosis not present

## 2014-09-21 DIAGNOSIS — C61 Malignant neoplasm of prostate: Secondary | ICD-10-CM | POA: Diagnosis not present

## 2014-10-03 ENCOUNTER — Ambulatory Visit (HOSPITAL_BASED_OUTPATIENT_CLINIC_OR_DEPARTMENT_OTHER): Payer: Medicare Other | Admitting: Oncology

## 2014-10-03 ENCOUNTER — Telehealth: Payer: Self-pay | Admitting: Oncology

## 2014-10-03 ENCOUNTER — Other Ambulatory Visit (HOSPITAL_BASED_OUTPATIENT_CLINIC_OR_DEPARTMENT_OTHER): Payer: Medicare Other

## 2014-10-03 VITALS — BP 114/61 | HR 67 | Temp 98.6°F | Resp 18 | Ht 71.0 in | Wt 216.9 lb

## 2014-10-03 DIAGNOSIS — C7951 Secondary malignant neoplasm of bone: Secondary | ICD-10-CM

## 2014-10-03 DIAGNOSIS — R634 Abnormal weight loss: Secondary | ICD-10-CM

## 2014-10-03 DIAGNOSIS — E291 Testicular hypofunction: Secondary | ICD-10-CM

## 2014-10-03 DIAGNOSIS — C61 Malignant neoplasm of prostate: Secondary | ICD-10-CM

## 2014-10-03 LAB — COMPREHENSIVE METABOLIC PANEL (CC13)
ALT: 13 U/L (ref 0–55)
AST: 14 U/L (ref 5–34)
Albumin: 3.6 g/dL (ref 3.5–5.0)
Alkaline Phosphatase: 54 U/L (ref 40–150)
Anion Gap: 14 mEq/L — ABNORMAL HIGH (ref 3–11)
BILIRUBIN TOTAL: 0.24 mg/dL (ref 0.20–1.20)
BUN: 28.4 mg/dL — AB (ref 7.0–26.0)
CALCIUM: 9.6 mg/dL (ref 8.4–10.4)
CO2: 22 mEq/L (ref 22–29)
Chloride: 104 mEq/L (ref 98–109)
Creatinine: 1.6 mg/dL — ABNORMAL HIGH (ref 0.7–1.3)
EGFR: 40 mL/min/{1.73_m2} — ABNORMAL LOW (ref >90)
GLUCOSE: 100 mg/dL (ref 70–140)
Potassium: 4.7 mEq/L (ref 3.5–5.1)
Sodium: 140 mEq/L (ref 136–145)
Total Protein: 6.6 g/dL (ref 6.4–8.3)

## 2014-10-03 LAB — CBC WITH DIFFERENTIAL/PLATELET
BASO%: 0.8 % (ref 0.0–2.0)
Basophils Absolute: 0.1 10*3/uL (ref 0.0–0.1)
EOS%: 2.8 % (ref 0.0–7.0)
Eosinophils Absolute: 0.2 10*3/uL (ref 0.0–0.5)
HCT: 39.6 % (ref 38.4–49.9)
HGB: 13 g/dL (ref 13.0–17.1)
LYMPH%: 23.6 % (ref 14.0–49.0)
MCH: 30.2 pg (ref 27.2–33.4)
MCHC: 32.8 g/dL (ref 32.0–36.0)
MCV: 92.2 fL (ref 79.3–98.0)
MONO#: 0.4 10*3/uL (ref 0.1–0.9)
MONO%: 6.1 % (ref 0.0–14.0)
NEUT#: 4.6 10*3/uL (ref 1.5–6.5)
NEUT%: 66.7 % (ref 39.0–75.0)
Platelets: 180 10*3/uL (ref 140–400)
RBC: 4.3 10*6/uL (ref 4.20–5.82)
RDW: 15.1 % — ABNORMAL HIGH (ref 11.0–14.6)
WBC: 6.9 10*3/uL (ref 4.0–10.3)
lymph#: 1.6 10*3/uL (ref 0.9–3.3)

## 2014-10-03 NOTE — Progress Notes (Signed)
Hematology and Oncology Follow Up Visit  Eric Deleon [id number] [birth year] 81 y.o. 10/03/2014 1:07 PM Eric Deleon, MDPerini, [name], MD   Principle Diagnosis: 81 year old gentleman with castration resistant prostate cancer with metastatic disease to the bone as well as pelvic adenopathy. He was diagnosed in 2007 with a Gleason score 4+3 equals 7 and a PSA of 4.   Prior Therapy:  He received a definitive therapy with radiation therapy and developed biochemical relapse initially treated with androgen deprivation.  He developed bony metastasis including a frontal bone metastasis and was treated with radiation therapy in February of 2013. Most recently his PSA was up to 13.38 despite castration levels of testosterone. His most recent bone scan in June of 2015 showed progression of disease.   Current therapy: Xtandi 160 mg daily started in August of 2015.  Interim History: [name] presents today for a followup visit accompanied by his wife. Since his last visit, continues to do very well. He reports no complications from [place]. He does report occasional mild nausea but no vomiting but have adjusted to these symptoms reasonably well. He continues to exercise regularly and have lost 20 pounds intentionally. He continues to be active and performs activities of daily living. He does not report any seizure activity or changes in his performance status.  He has not reported any new bone pain or back pain. Has not reported any new constitutional symptoms.  He does not report any headaches or blurry vision or double vision. Does not report any syncope or seizures. Appetite and performance status remain stable. Does not report any chest pain shortness of breath or cough. As that report any hemoptysis or hematemesis. Does not report any leg edema or palpitation. Does not report any frequency urgency or hesitancy. Does not report any skeletal complaints back pain shoulder pain or hip pain. Does not report any  arthralgias or myalgias. He does not report any rashes or lesions or petechiae. Remainder of his review of systems unremarkable   Medications:   Current Outpatient Prescriptions  Medication Sig Dispense Refill  . allopurinol (ZYLOPRIM) 300 MG tablet Take 300 mg by mouth daily.  3  . atorvastatin (LIPITOR) 20 MG tablet Take 20 mg by mouth daily.  4  . diphenhydrAMINE (BENADRYL) 25 MG tablet Take 25 mg by mouth every 6 (six) hours as needed.    . enzalutamide (XTANDI) 40 MG capsule Take 4 capsules (160 mg total) by mouth daily. 120 capsule 0  . LORazepam (ATIVAN) 1 MG tablet as directed.  1  . omeprazole (PRILOSEC) 20 MG capsule Take 20 mg by mouth daily.    . ondansetron (ZOFRAN-ODT) 4 MG disintegrating tablet Take 4 mg by mouth every 8 (eight) hours as needed for nausea or vomiting.    . promethazine (PHENERGAN) 12.5 MG tablet Take 12.5 mg by mouth every 6 (six) hours as needed. for nausea  1  . simethicone (MYLICON) 80 MG chewable tablet Chew 80 mg by mouth every 6 (six) hours as needed.    . Tamsulosin HCl (FLOMAX) 0.4 MG CAPS Take by mouth.    . Vitamin D, Cholecalciferol, 400 UNITS CHEW Chew 800 Units by mouth daily.     No current facility-administered medications for this visit.     Allergies:  Allergies  Allergen Reactions  . Antihistamines, Chlorpheniramine-Type     Past Medical History, Surgical history, Social history, and Family History were reviewed and updated.   Physical Exam: Blood pressure 114/61, pulse 67, temperature 98.6 F (37 C),  temperature source Oral, resp. rate 18, height 5\' 11"  (1.803 m), weight 216 lb 14.4 oz (98.385 kg). ECOG: 1 General appearance: alert and cooperative not in any distress. Head: Normocephalic, without obvious abnormality Neck: no adenopathy Lymph nodes: Cervical, supraclavicular, and axillary nodes normal. Heart:regular rate and rhythm, S1, S2 normal, no murmur, click, rub or gallop Lung:chest clear, no wheezing, rales, normal  symmetric air entry Abdomen: soft, non-tender, without masses or organomegaly EXT:no erythema, induration, or nodules Skin:  No rashes or lesions.  Lab Results: Lab Results  Component Value Date   WBC 6.9 10/03/2014   HGB 13.0 10/03/2014   HCT 39.6 10/03/2014   MCV 92.2 10/03/2014   PLT 180 10/03/2014     Chemistry      Component Value Date/Time   NA 138 09/03/2014 1039   NA 135 01/23/2014 1055   K 4.6 09/03/2014 1039   K 4.7 01/23/2014 1055   CL 102 01/23/2014 1055   CO2 23 09/03/2014 1039   CO2 27 01/23/2014 1055   BUN 17.9 09/03/2014 1039   BUN 24* 01/23/2014 1055   CREATININE 1.5* 09/03/2014 1039   CREATININE 1.60* 01/23/2014 1055      Component Value Date/Time   CALCIUM 8.6 09/03/2014 1039   CALCIUM 9.0 01/23/2014 1055   ALKPHOS 46 09/03/2014 1039   ALKPHOS 53 01/23/2014 1055   AST 14 09/03/2014 1039   AST 18 01/23/2014 1055   ALT 12 09/03/2014 1039   ALT 16 01/23/2014 1055   BILITOT 0.49 09/03/2014 1039   BILITOT 0.4 01/23/2014 1055       Results for [name], Eric Deleon (MRN [id number]) as of 10/03/2014 12:50  Ref. Range 06/01/2014 12:29 06/29/2014 11:29 08/01/2014 11:17 09/03/2014 10:38  PSA Latest Ref Range: <=4.00 ng/mL 4.15 (H) 3.84 3.13 3.35     Impression and Plan:  81 year old with:  1. Castration resistant prostate cancer with metastatic disease to the bone. His initial diagnosis was 2007 with a Gleason score 4+3 equal 7 and a PSA of 4. He is currently on Xtandi and has tolerated it well. His PSA dropped from 13.38 to 3.35. He is tolerating this medication well and plan on to continue with the same dose and schedule. I see no need for any dose reduction or delay.  2. Androgen depravation: Recommended  that he continues on the current androgen depravation therapy. He is receiving this at [facility] urology.  3. Bone directed therapy: He is currently on Xgeva an we recommend for him to continue monthly. He is receiving it at [facility] urology. Continue to  educate him about osteonecrosis of the job and other complications.  4. Weight loss: Mostly intentional but we will continue to monitor it at this time.   5. Followup: Will be in one month for reevaluation.     Zola Button, MD 4/20/20161:07 PM

## 2014-10-03 NOTE — Telephone Encounter (Signed)
gave and printed appt sched and avs fo rpt for May °

## 2014-10-04 LAB — PSA: PSA: 3.71 ng/mL (ref <=4.00)

## 2014-10-05 ENCOUNTER — Ambulatory Visit: Payer: Medicare Other | Admitting: Oncology

## 2014-10-05 ENCOUNTER — Other Ambulatory Visit: Payer: Medicare Other

## 2014-10-12 ENCOUNTER — Other Ambulatory Visit: Payer: Medicare Other

## 2014-10-12 ENCOUNTER — Ambulatory Visit: Payer: Medicare Other | Admitting: Physician Assistant

## 2014-10-18 ENCOUNTER — Other Ambulatory Visit: Payer: Self-pay | Admitting: Oncology

## 2014-10-18 DIAGNOSIS — C61 Malignant neoplasm of prostate: Secondary | ICD-10-CM

## 2014-10-26 DIAGNOSIS — C7951 Secondary malignant neoplasm of bone: Secondary | ICD-10-CM | POA: Diagnosis not present

## 2014-10-26 DIAGNOSIS — C61 Malignant neoplasm of prostate: Secondary | ICD-10-CM | POA: Diagnosis not present

## 2014-10-28 IMAGING — US US RENAL
1 series · 14 of 25 positions shown · non-contrast
Comparison: CT abdomen pelvis dated 06/04/2011

CLINICAL DATA: Renal insufficiency, evaluate for obstruction.

EXAM:
RENAL/URINARY TRACT ULTRASOUND COMPLETE

[Series 1: us renal · 0.35mm/px · 14 of 56 slices shown]
[im 1/56]
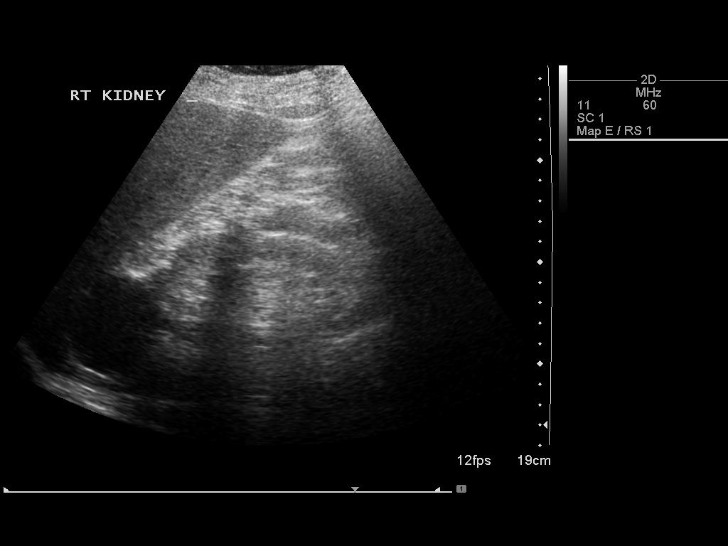
[im 5/56]
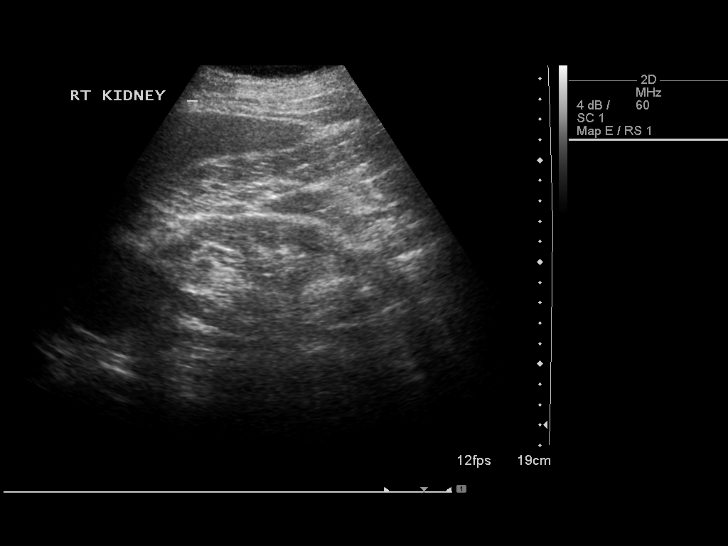
[im 10/56]
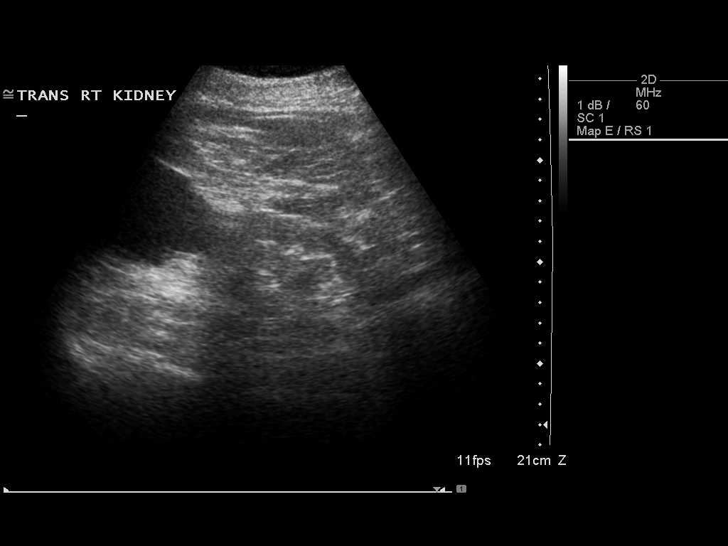
[im 14/56]
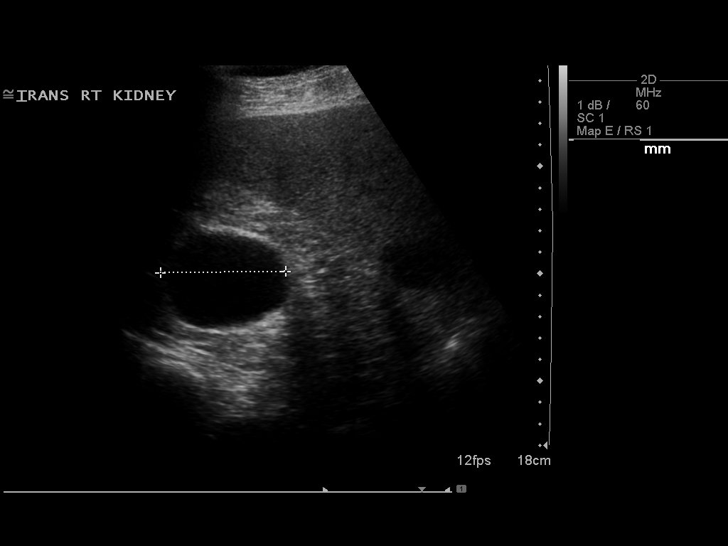
[im 19/56]
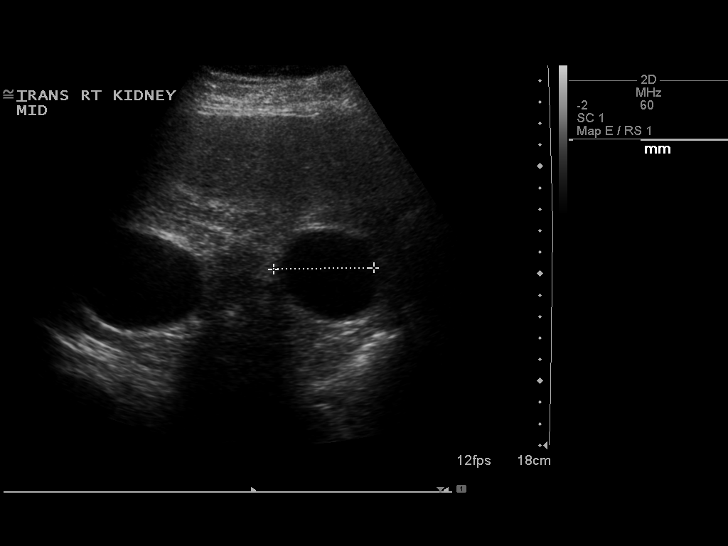
[im 21/56]
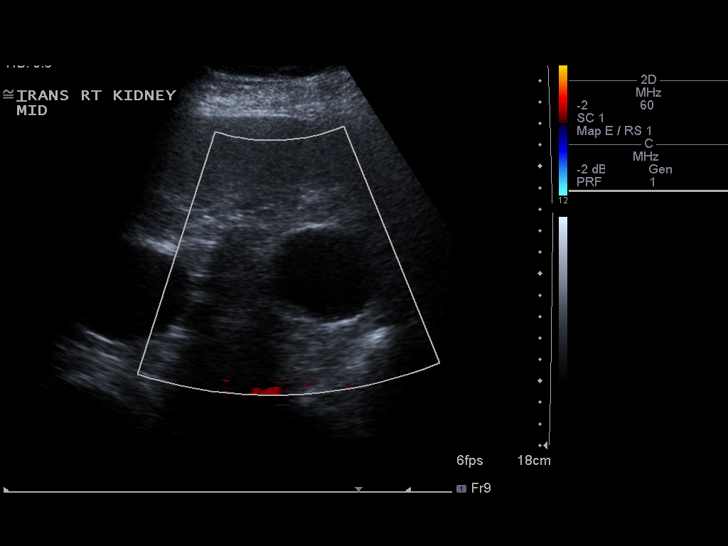
[im 26/56]
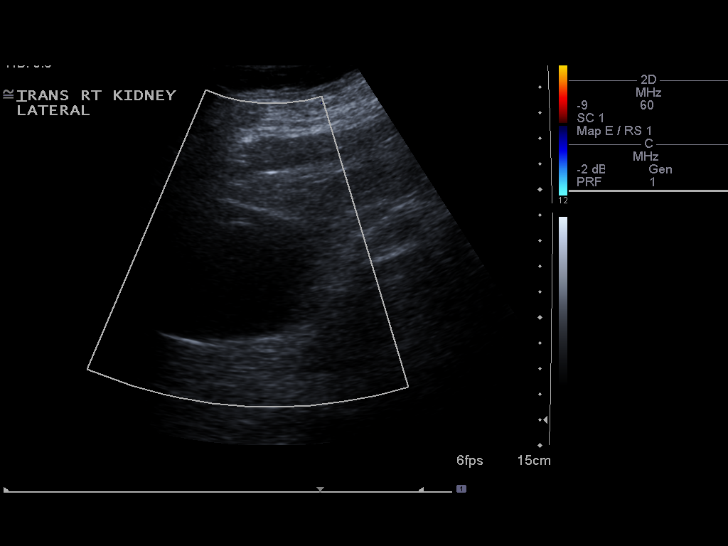
[im 30/56]
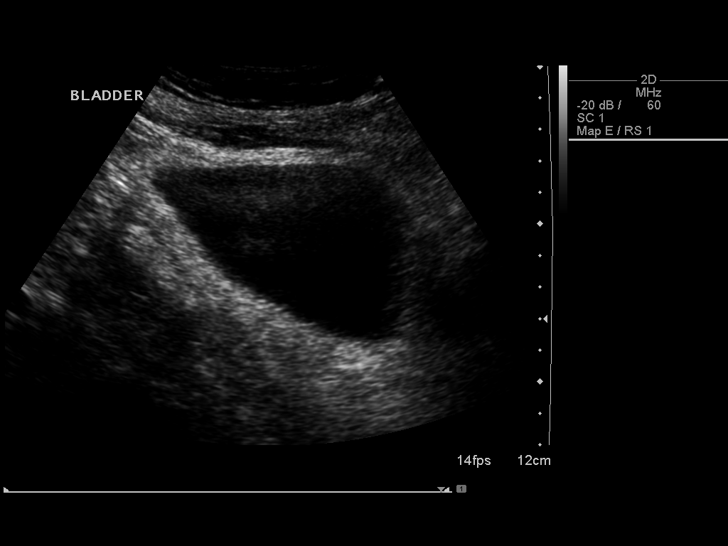
[im 35/56]
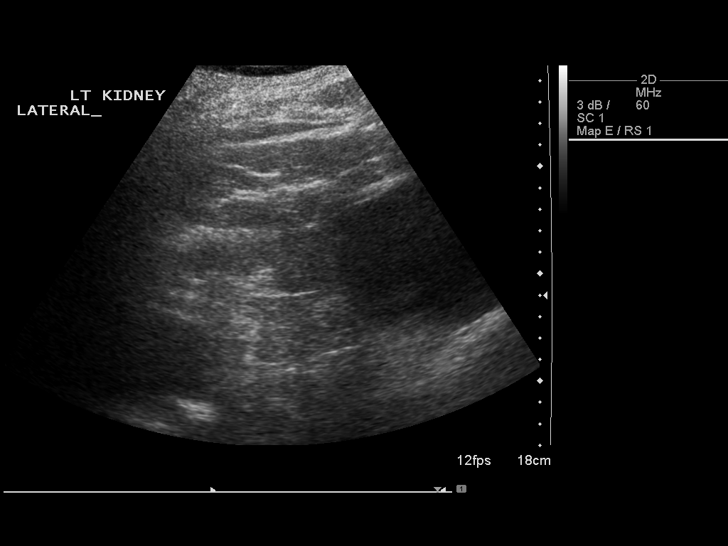
[im 37/56]
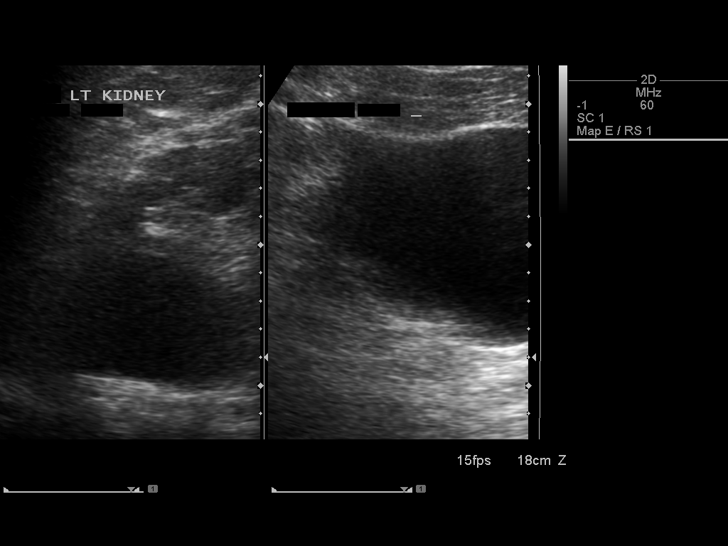
[im 42/56]
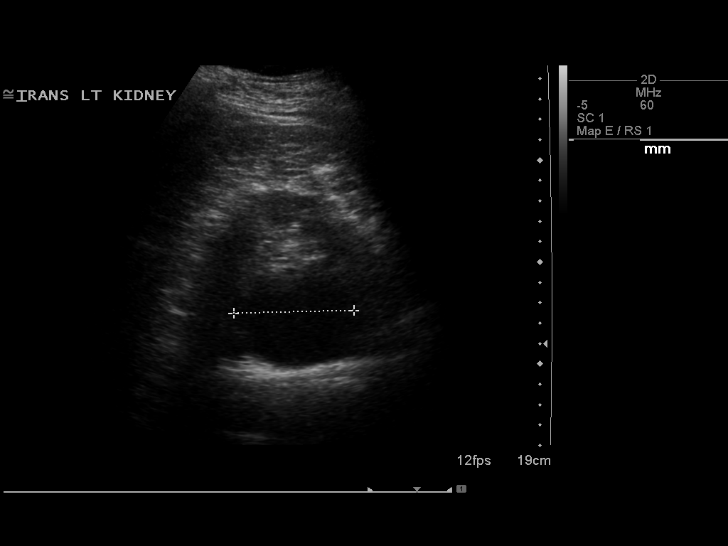
[im 46/56]
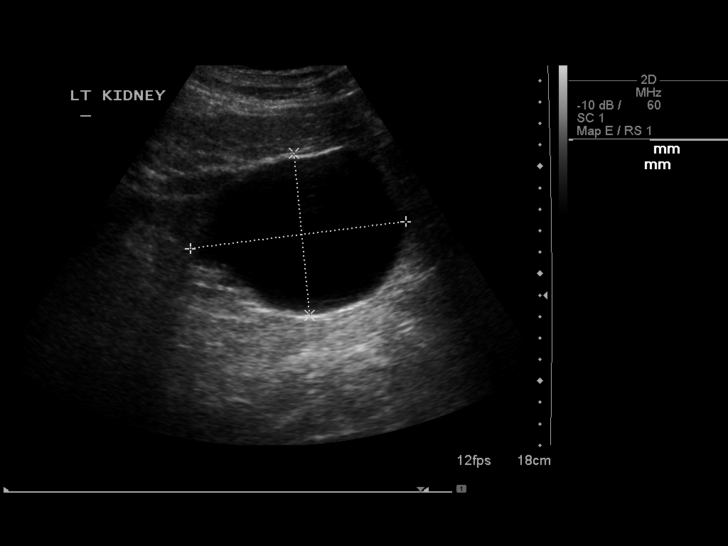
[im 51/56]
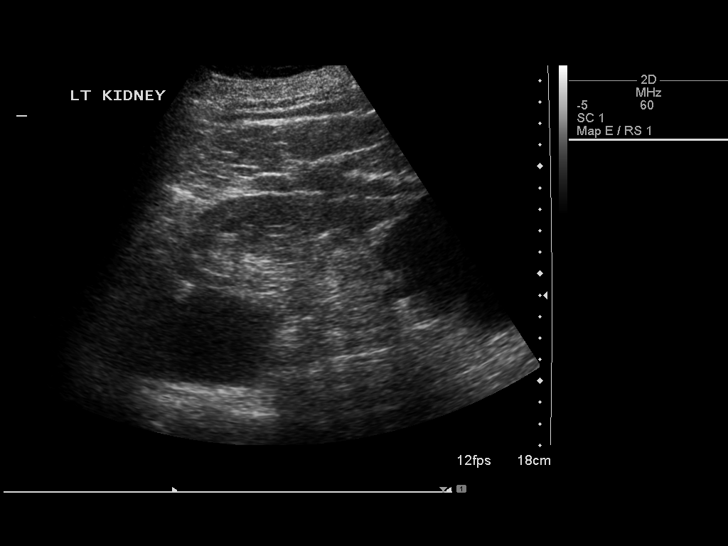
[im 56/56]
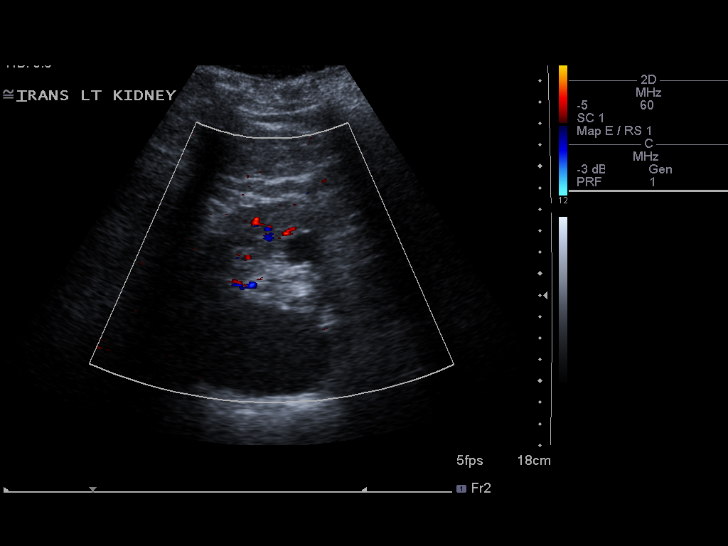

[14 of 25 positions shown; findings below may reference images not displayed]

FINDINGS: Right Kidney

Length: 12.6 cm. Multiple cysts, including a 6.0 x 5.7 x 6.7 cm
lower pole cyst. No hydronephrosis.

Left Kidney

Length: 13.0 cm. Multiple cysts, including a 10.1 x 7.6 x 9.4 cm
lower pole cyst. No hydronephrosis.

Bladder

Within normal limits
IMPRESSION: Bilateral renal cysts, measuring up to 10.1 cm in the left lower
pole.

No hydronephrosis.

## 2014-11-09 ENCOUNTER — Encounter: Payer: Self-pay | Admitting: Physician Assistant

## 2014-11-09 ENCOUNTER — Other Ambulatory Visit (HOSPITAL_BASED_OUTPATIENT_CLINIC_OR_DEPARTMENT_OTHER): Payer: Medicare Other

## 2014-11-09 ENCOUNTER — Ambulatory Visit (HOSPITAL_BASED_OUTPATIENT_CLINIC_OR_DEPARTMENT_OTHER): Payer: Medicare Other | Admitting: Physician Assistant

## 2014-11-09 ENCOUNTER — Telehealth: Payer: Self-pay | Admitting: Physician Assistant

## 2014-11-09 VITALS — BP 132/77 | HR 73 | Temp 98.4°F | Resp 18 | Ht 71.0 in | Wt 216.5 lb

## 2014-11-09 DIAGNOSIS — E291 Testicular hypofunction: Secondary | ICD-10-CM | POA: Diagnosis not present

## 2014-11-09 DIAGNOSIS — C7951 Secondary malignant neoplasm of bone: Secondary | ICD-10-CM | POA: Diagnosis not present

## 2014-11-09 DIAGNOSIS — C61 Malignant neoplasm of prostate: Secondary | ICD-10-CM

## 2014-11-09 DIAGNOSIS — R634 Abnormal weight loss: Secondary | ICD-10-CM

## 2014-11-09 LAB — COMPREHENSIVE METABOLIC PANEL (CC13)
ALBUMIN: 3.4 g/dL — AB (ref 3.5–5.0)
ALT: 10 U/L (ref 0–55)
AST: 16 U/L (ref 5–34)
Alkaline Phosphatase: 52 U/L (ref 40–150)
Anion Gap: 9 mEq/L (ref 3–11)
BUN: 22.1 mg/dL (ref 7.0–26.0)
CHLORIDE: 108 meq/L (ref 98–109)
CO2: 24 meq/L (ref 22–29)
Calcium: 8.3 mg/dL — ABNORMAL LOW (ref 8.4–10.4)
Creatinine: 1.5 mg/dL — ABNORMAL HIGH (ref 0.7–1.3)
EGFR: 44 mL/min/{1.73_m2} — AB (ref >90)
Glucose: 79 mg/dl (ref 70–140)
Potassium: 4.4 mEq/L (ref 3.5–5.1)
Sodium: 141 mEq/L (ref 136–145)
TOTAL PROTEIN: 6.2 g/dL — AB (ref 6.4–8.3)
Total Bilirubin: 0.45 mg/dL (ref 0.20–1.20)

## 2014-11-09 LAB — CBC WITH DIFFERENTIAL/PLATELET
BASO%: 1.1 % (ref 0.0–2.0)
BASOS ABS: 0.1 10*3/uL (ref 0.0–0.1)
EOS ABS: 0.1 10*3/uL (ref 0.0–0.5)
EOS%: 2.5 % (ref 0.0–7.0)
HEMATOCRIT: 38.5 % (ref 38.4–49.9)
HEMOGLOBIN: 12.9 g/dL — AB (ref 13.0–17.1)
LYMPH%: 26.7 % (ref 14.0–49.0)
MCH: 31.3 pg (ref 27.2–33.4)
MCHC: 33.5 g/dL (ref 32.0–36.0)
MCV: 93.4 fL (ref 79.3–98.0)
MONO#: 0.5 10*3/uL (ref 0.1–0.9)
MONO%: 8.6 % (ref 0.0–14.0)
NEUT#: 3.2 10*3/uL (ref 1.5–6.5)
NEUT%: 61.1 % (ref 39.0–75.0)
Platelets: 149 10*3/uL (ref 140–400)
RBC: 4.12 10*6/uL — AB (ref 4.20–5.82)
RDW: 14.5 % (ref 11.0–14.6)
WBC: 5.2 10*3/uL (ref 4.0–10.3)
lymph#: 1.4 10*3/uL (ref 0.9–3.3)

## 2014-11-09 NOTE — Telephone Encounter (Signed)
per pof to sch pt appt-gave pt copy of sch °

## 2014-11-09 NOTE — Progress Notes (Signed)
Hematology and Oncology Follow Up Visit  Sem Mccaughey [id number] 09-19-1931 81 y.o. 11/09/2014 3:48 PM [name], MDPerini, [name], MD   Principle Diagnosis: 81 year old gentleman with castration resistant prostate cancer with metastatic disease to the bone as well as pelvic adenopathy. He was diagnosed in 2007 with a Gleason score 4+3 equals 7 and a PSA of 4.   Prior Therapy:  He received a definitive therapy with radiation therapy and developed biochemical relapse initially treated with androgen deprivation.  He developed bony metastasis including a frontal bone metastasis and was treated with radiation therapy in February of 2013. Most recently his PSA was up to 13.38 despite castration levels of testosterone. His most recent bone scan in June of 2015 showed progression of disease.   Current therapy: Xtandi 160 mg daily started in August of 2015.  Interim History: Mr. Fullilove presents today for a followup visit accompanied by his wife. Since his last visit, continues to do very well. He reports no complications from [place]. He does report occasional mild nausea but no vomiting but has adjusted to these symptoms reasonably well. He continues to exercise regularly. He continues to be active and performs activities of daily living. He does not report any seizure activity or changes in his performance status.  He has not reported any new bone pain or back pain. Has not reported any new constitutional symptoms.  He does not report any headaches or blurry vision or double vision. Does not report any syncope or seizures. Appetite and performance status remain stable. Does not report any chest pain shortness of breath or cough. As that report any hemoptysis or hematemesis. Does not report any leg edema or palpitation. Does not report any frequency urgency or hesitancy. Does not report any skeletal complaints back pain shoulder pain or hip pain. Does not report any arthralgias or myalgias. He does not  report any rashes or lesions or petechiae. Remainder of his review of systems unremarkable   Medications:   Current Outpatient Prescriptions  Medication Sig Dispense Refill  . allopurinol (ZYLOPRIM) 300 MG tablet Take 300 mg by mouth daily.  3  . atorvastatin (LIPITOR) 20 MG tablet Take 20 mg by mouth daily.  4  . diphenhydrAMINE (BENADRYL) 25 MG tablet Take 25 mg by mouth every 6 (six) hours as needed.    Marland Kitchen LORazepam (ATIVAN) 1 MG tablet as directed.  1  . omeprazole (PRILOSEC) 20 MG capsule Take 20 mg by mouth daily.    . ondansetron (ZOFRAN-ODT) 4 MG disintegrating tablet Take 4 mg by mouth every 8 (eight) hours as needed for nausea or vomiting.    . promethazine (PHENERGAN) 12.5 MG tablet Take 12.5 mg by mouth every 6 (six) hours as needed. for nausea  1  . simethicone (MYLICON) 80 MG chewable tablet Chew 80 mg by mouth every 6 (six) hours as needed.    . Tamsulosin HCl (FLOMAX) 0.4 MG CAPS Take by mouth.    . Vitamin D, Cholecalciferol, 400 UNITS CHEW Chew 800 Units by mouth daily.    [name] 40 MG capsule TAKE 4 CAPSULES BY MOUTH DAILY. 120 capsule 0   No current facility-administered medications for this visit.     Allergies:  Allergies  Allergen Reactions  . Antihistamines, Chlorpheniramine-Type     Past Medical History, Surgical history, Social history, and Family History were reviewed and updated.   Physical Exam: Blood pressure 132/77, pulse 73, temperature 98.4 F (36.9 C), temperature source Oral, resp. rate 18, height 5\' 11"  (1.803  m), weight 216 lb 8 oz (98.204 kg), SpO2 98 %. ECOG: 1 General appearance: alert and cooperative not in any distress. Head: Normocephalic, without obvious abnormality Neck: no adenopathy Lymph nodes: Cervical, supraclavicular, and axillary nodes normal. Heart:regular rate and rhythm, S1, S2 normal, no murmur, click, rub or gallop Lung:chest clear, no wheezing, rales, normal symmetric air entry Abdomen: soft, non-tender, without  masses or organomegaly EXT:no erythema, induration, or nodules Skin:  No rashes or lesions.  Lab Results: Lab Results  Component Value Date   WBC 5.2 11/09/2014   HGB 12.9* 11/09/2014   HCT 38.5 11/09/2014   MCV 93.4 11/09/2014   PLT 149 11/09/2014     Chemistry      Component Value Date/Time   NA 141 11/09/2014 1101   NA 135 01/23/2014 1055   K 4.4 11/09/2014 1101   K 4.7 01/23/2014 1055   CL 102 01/23/2014 1055   CO2 24 11/09/2014 1101   CO2 27 01/23/2014 1055   BUN 22.1 11/09/2014 1101   BUN 24* 01/23/2014 1055   CREATININE 1.5* 11/09/2014 1101   CREATININE 1.60* 01/23/2014 1055      Component Value Date/Time   CALCIUM 8.3* 11/09/2014 1101   CALCIUM 9.0 01/23/2014 1055   ALKPHOS 52 11/09/2014 1101   ALKPHOS 53 01/23/2014 1055   AST 16 11/09/2014 1101   AST 18 01/23/2014 1055   ALT 10 11/09/2014 1101   ALT 16 01/23/2014 1055   BILITOT 0.45 11/09/2014 1101   BILITOT 0.4 01/23/2014 1055       Results for PAGE, LANCON (MRN [id number]) as of 10/03/2014 12:50  Ref. Range 06/01/2014 12:29 06/29/2014 11:29 08/01/2014 11:17 09/03/2014 10:38  PSA Latest Ref Range: <=4.00 ng/mL 4.15 (H) 3.84 3.13 3.35     Impression and Plan:  81 year old with:  1. Castration resistant prostate cancer with metastatic disease to the bone. His initial diagnosis was 2007 with a Gleason score 4+3 equal 7 and a PSA of 4. He is currently on Xtandi and has tolerated it well. His PSA dropped from 13.38 to 3.35. He is tolerating this medication well and plan on to continue with the same dose and schedule. No need for any dose reduction or delay aat this time.  2. Androgen depravation: Recommended  that he continues on the current androgen depravation therapy. He is receiving this at [facility] urology.  3. Bone directed therapy: He is currently on Xgeva an we recommend for him to continue monthly. He is receiving it at [facility] urology. Continue to educate him about osteonecrosis of the job and other  complications.  4. Weight loss: Mostly intentional but we will continue to monitor it at this time.   5. Followup: Will be in one month for reevaluation.     Carlton Adam, PA-C  5/27/20163:48 PM

## 2014-11-10 LAB — PSA: PSA: 3.42 ng/mL (ref <=4.00)

## 2014-11-13 NOTE — Patient Instructions (Signed)
Continue Xtandi 160 mg by mouth daily Follow up in one month

## 2014-11-27 DIAGNOSIS — C7951 Secondary malignant neoplasm of bone: Secondary | ICD-10-CM | POA: Diagnosis not present

## 2014-11-27 DIAGNOSIS — C61 Malignant neoplasm of prostate: Secondary | ICD-10-CM | POA: Diagnosis not present

## 2014-12-03 DIAGNOSIS — C61 Malignant neoplasm of prostate: Secondary | ICD-10-CM | POA: Diagnosis not present

## 2014-12-03 DIAGNOSIS — E291 Testicular hypofunction: Secondary | ICD-10-CM | POA: Diagnosis not present

## 2014-12-03 DIAGNOSIS — C7951 Secondary malignant neoplasm of bone: Secondary | ICD-10-CM | POA: Diagnosis not present

## 2014-12-03 DIAGNOSIS — N320 Bladder-neck obstruction: Secondary | ICD-10-CM | POA: Diagnosis not present

## 2014-12-07 ENCOUNTER — Other Ambulatory Visit (HOSPITAL_BASED_OUTPATIENT_CLINIC_OR_DEPARTMENT_OTHER): Payer: Medicare Other

## 2014-12-07 ENCOUNTER — Telehealth: Payer: Self-pay | Admitting: Oncology

## 2014-12-07 ENCOUNTER — Ambulatory Visit (HOSPITAL_BASED_OUTPATIENT_CLINIC_OR_DEPARTMENT_OTHER): Payer: Medicare Other | Admitting: Oncology

## 2014-12-07 VITALS — BP 119/69 | HR 79 | Temp 97.8°F | Resp 18 | Ht 71.0 in | Wt 215.5 lb

## 2014-12-07 DIAGNOSIS — C7951 Secondary malignant neoplasm of bone: Secondary | ICD-10-CM

## 2014-12-07 DIAGNOSIS — C61 Malignant neoplasm of prostate: Secondary | ICD-10-CM | POA: Diagnosis not present

## 2014-12-07 DIAGNOSIS — E291 Testicular hypofunction: Secondary | ICD-10-CM | POA: Diagnosis not present

## 2014-12-07 LAB — CBC WITH DIFFERENTIAL/PLATELET
BASO%: 1.3 % (ref 0.0–2.0)
Basophils Absolute: 0.1 10*3/uL (ref 0.0–0.1)
EOS%: 2.4 % (ref 0.0–7.0)
Eosinophils Absolute: 0.2 10*3/uL (ref 0.0–0.5)
HCT: 41.5 % (ref 38.4–49.9)
HGB: 13.7 g/dL (ref 13.0–17.1)
LYMPH%: 20 % (ref 14.0–49.0)
MCH: 30.7 pg (ref 27.2–33.4)
MCHC: 33 g/dL (ref 32.0–36.0)
MCV: 93 fL (ref 79.3–98.0)
MONO#: 0.4 10*3/uL (ref 0.1–0.9)
MONO%: 5.6 % (ref 0.0–14.0)
NEUT#: 4.5 10*3/uL (ref 1.5–6.5)
NEUT%: 70.7 % (ref 39.0–75.0)
Platelets: 185 10*3/uL (ref 140–400)
RBC: 4.47 10*6/uL (ref 4.20–5.82)
RDW: 15 % — ABNORMAL HIGH (ref 11.0–14.6)
WBC: 6.4 10*3/uL (ref 4.0–10.3)
lymph#: 1.3 10*3/uL (ref 0.9–3.3)

## 2014-12-07 LAB — COMPREHENSIVE METABOLIC PANEL (CC13)
ALK PHOS: 54 U/L (ref 40–150)
ALT: 13 U/L (ref 0–55)
ANION GAP: 7 meq/L (ref 3–11)
AST: 15 U/L (ref 5–34)
Albumin: 3.8 g/dL (ref 3.5–5.0)
BILIRUBIN TOTAL: 0.54 mg/dL (ref 0.20–1.20)
BUN: 19.9 mg/dL (ref 7.0–26.0)
CO2: 26 mEq/L (ref 22–29)
CREATININE: 1.4 mg/dL — AB (ref 0.7–1.3)
Calcium: 9.2 mg/dL (ref 8.4–10.4)
Chloride: 104 mEq/L (ref 98–109)
EGFR: 49 mL/min/{1.73_m2} — ABNORMAL LOW (ref >90)
GLUCOSE: 105 mg/dL (ref 70–140)
POTASSIUM: 4.2 meq/L (ref 3.5–5.1)
Sodium: 138 mEq/L (ref 136–145)
Total Protein: 6.8 g/dL (ref 6.4–8.3)

## 2014-12-07 NOTE — Telephone Encounter (Signed)
Gave patient avs report and appointments for July.  °

## 2014-12-07 NOTE — Progress Notes (Signed)
Hematology and Oncology Follow Up Visit  Eric Deleon [id number] [birth year] 81 y.o. 12/07/2014 1:09 PM Eric Deleon, MDPerini, [name], MD   Principle Diagnosis: 81 year old gentleman with castration resistant prostate cancer with metastatic disease to the bone as well as pelvic adenopathy. He was diagnosed in 2007 with a Gleason score 4+3 equals 7 and a PSA of 4.   Prior Therapy:  He received a definitive therapy with radiation therapy and developed biochemical relapse initially treated with androgen deprivation.  He developed bony metastasis including a frontal bone metastasis and was treated with radiation therapy in February of 2013. Most recently his PSA was up to 13.38 despite castration levels of testosterone. His most recent bone scan in June of 2015 showed progression of disease.   Current therapy: Xtandi 160 mg daily started in August of 2015.  Interim History: Eric Deleon presents today for a followup visit accompanied by his wife. Since his last visit, he reports no new issues. He reports no complications from [place]. He does report occasional mild nausea that lasts a few hours but able to eat well afterwards. He has not reported any loss and his appetite or weight. He continues to exercise regularly. He continues to be active and performs activities of daily living. He does not report any seizure activity or changes in his performance status.  He has not reported any new bone pain or back pain. Has not reported any new constitutional symptoms.  He does not report any headaches or blurry vision or double vision. Does not report any syncope or seizures. Appetite and performance status remain stable. Does not report any chest pain shortness of breath or cough. As that report any hemoptysis or hematemesis. Does not report any leg edema or palpitation. Does not report any frequency urgency or hesitancy. Does not report any skeletal complaints back pain shoulder pain or hip pain. Does not report  any arthralgias or myalgias. He does not report any rashes or lesions or petechiae. Remainder of his review of systems unremarkable   Medications:   Current Outpatient Prescriptions  Medication Sig Dispense Refill  . allopurinol (ZYLOPRIM) 300 MG tablet Take 300 mg by mouth daily.  3  . atorvastatin (LIPITOR) 20 MG tablet Take 20 mg by mouth daily.  4  . diphenhydrAMINE (BENADRYL) 25 MG tablet Take 25 mg by mouth every 6 (six) hours as needed.    Marland Kitchen LORazepam (ATIVAN) 1 MG tablet as directed.  1  . omeprazole (PRILOSEC) 20 MG capsule Take 20 mg by mouth daily.    . ondansetron (ZOFRAN-ODT) 4 MG disintegrating tablet Take 4 mg by mouth every 8 (eight) hours as needed for nausea or vomiting.    . promethazine (PHENERGAN) 12.5 MG tablet Take 12.5 mg by mouth every 6 (six) hours as needed. for nausea  1  . simethicone (MYLICON) 80 MG chewable tablet Chew 80 mg by mouth every 6 (six) hours as needed.    . Tamsulosin HCl (FLOMAX) 0.4 MG CAPS Take by mouth.    . Vitamin D, Cholecalciferol, 400 UNITS CHEW Chew 800 Units by mouth daily.    Eric Deleon 40 MG capsule TAKE 4 CAPSULES BY MOUTH DAILY. 120 capsule 0   No current facility-administered medications for this visit.     Allergies:  No Active Allergies  Past Medical History, Surgical history, Social history, and Family History were reviewed and updated.   Physical Exam: Blood pressure 119/69, pulse 79, temperature 97.8 F (36.6 C), temperature source Oral, resp. rate 18, height  5\' 11"  (1.803 m), weight 215 lb 8 oz (97.75 kg). ECOG: 1 General appearance: alert and cooperative well-appearing gentleman without any distress. Head: Normocephalic, without obvious abnormality Neck: No masses palpated. Lymph nodes: Cervical, supraclavicular, and axillary nodes normal. Heart:regular rate and rhythm, S1, S2 normal, no murmur, click, rub or gallop Lung:chest clear, no wheezing, rales, normal symmetric air entry Abdomen: soft, non-tender, without  masses or organomegaly EXT:no erythema, induration, or nodules Skin:  No rashes or lesions.  Lab Results: Lab Results  Component Value Date   WBC 6.4 12/07/2014   HGB 13.7 12/07/2014   HCT 41.5 12/07/2014   MCV 93.0 12/07/2014   PLT 185 12/07/2014     Chemistry      Component Value Date/Time   NA 141 11/09/2014 1101   NA 135 01/23/2014 1055   K 4.4 11/09/2014 1101   K 4.7 01/23/2014 1055   CL 102 01/23/2014 1055   CO2 24 11/09/2014 1101   CO2 27 01/23/2014 1055   BUN 22.1 11/09/2014 1101   BUN 24* 01/23/2014 1055   CREATININE 1.5* 11/09/2014 1101   CREATININE 1.60* 01/23/2014 1055      Component Value Date/Time   CALCIUM 8.3* 11/09/2014 1101   CALCIUM 9.0 01/23/2014 1055   ALKPHOS 52 11/09/2014 1101   ALKPHOS 53 01/23/2014 1055   AST 16 11/09/2014 1101   AST 18 01/23/2014 1055   ALT 10 11/09/2014 1101   ALT 16 01/23/2014 1055   BILITOT 0.45 11/09/2014 1101   BILITOT 0.4 01/23/2014 1055      Results for Eric Deleon (MRN [id number]) as of 12/07/2014 12:54  Ref. Range 08/01/2014 11:17 09/03/2014 10:38 10/03/2014 12:31 11/09/2014 11:01  PSA Latest Ref Range: <=4.00 ng/mL 3.13 3.35 3.71 3.42       Impression and Plan:  81 year old with:  1. Castration resistant prostate cancer with metastatic disease to the bone. His initial diagnosis was 2007 with a Gleason score 4+3 equal 7 and a PSA of 4.   He is currently on Xtandi and has tolerated it well. His PSA dropped from 13.38 to 3.13. His PSA continues to be under reasonable control. He is tolerating this medication well and plan on to continue with the same dose and schedule. No need for any dose reduction or delay at this time.  2. Androgen depravation: Recommended  that he continues on the current androgen depravation therapy. He is receiving this at [facility] urology.  3. Bone directed therapy: He is currently on Xgeva an we recommend for him to continue monthly. He is receiving it at [facility] urology. Continue to  educate him about osteonecrosis of the job and other complications.  4. Weight loss: His weight continue to be stable at this time.  5. Followup: Will be in 4-5 weeks for an evaluation.     [facility], MD 6/24/20161:09 PM

## 2014-12-08 LAB — PSA: PSA: 3.05 ng/mL (ref <=4.00)

## 2014-12-18 ENCOUNTER — Other Ambulatory Visit: Payer: Self-pay | Admitting: Oncology

## 2014-12-25 DIAGNOSIS — C7951 Secondary malignant neoplasm of bone: Secondary | ICD-10-CM | POA: Diagnosis not present

## 2014-12-25 DIAGNOSIS — C61 Malignant neoplasm of prostate: Secondary | ICD-10-CM | POA: Diagnosis not present

## 2015-01-11 ENCOUNTER — Other Ambulatory Visit (HOSPITAL_BASED_OUTPATIENT_CLINIC_OR_DEPARTMENT_OTHER): Payer: Medicare Other

## 2015-01-11 ENCOUNTER — Encounter: Payer: Self-pay | Admitting: Physician Assistant

## 2015-01-11 ENCOUNTER — Ambulatory Visit (HOSPITAL_BASED_OUTPATIENT_CLINIC_OR_DEPARTMENT_OTHER): Payer: Medicare Other | Admitting: Physician Assistant

## 2015-01-11 ENCOUNTER — Telehealth: Payer: Self-pay | Admitting: Physician Assistant

## 2015-01-11 VITALS — BP 143/77 | HR 79 | Temp 98.0°F | Resp 18 | Wt 211.2 lb

## 2015-01-11 DIAGNOSIS — C7951 Secondary malignant neoplasm of bone: Secondary | ICD-10-CM | POA: Diagnosis not present

## 2015-01-11 DIAGNOSIS — C61 Malignant neoplasm of prostate: Secondary | ICD-10-CM | POA: Diagnosis not present

## 2015-01-11 DIAGNOSIS — E291 Testicular hypofunction: Secondary | ICD-10-CM

## 2015-01-11 LAB — CBC WITH DIFFERENTIAL/PLATELET
BASO%: 0.8 % (ref 0.0–2.0)
BASOS ABS: 0 10*3/uL (ref 0.0–0.1)
EOS%: 2.5 % (ref 0.0–7.0)
Eosinophils Absolute: 0.1 10*3/uL (ref 0.0–0.5)
HCT: 40.5 % (ref 38.4–49.9)
HEMOGLOBIN: 13.4 g/dL (ref 13.0–17.1)
LYMPH#: 1.5 10*3/uL (ref 0.9–3.3)
LYMPH%: 26.2 % (ref 14.0–49.0)
MCH: 31.2 pg (ref 27.2–33.4)
MCHC: 33.2 g/dL (ref 32.0–36.0)
MCV: 94 fL (ref 79.3–98.0)
MONO#: 0.4 10*3/uL (ref 0.1–0.9)
MONO%: 7.4 % (ref 0.0–14.0)
NEUT%: 63.1 % (ref 39.0–75.0)
NEUTROS ABS: 3.6 10*3/uL (ref 1.5–6.5)
Platelets: 155 10*3/uL (ref 140–400)
RBC: 4.31 10*6/uL (ref 4.20–5.82)
RDW: 15 % — ABNORMAL HIGH (ref 11.0–14.6)
WBC: 5.8 10*3/uL (ref 4.0–10.3)

## 2015-01-11 LAB — COMPREHENSIVE METABOLIC PANEL (CC13)
ALBUMIN: 3.7 g/dL (ref 3.5–5.0)
ALT: 13 U/L (ref 0–55)
ANION GAP: 7 meq/L (ref 3–11)
AST: 17 U/L (ref 5–34)
Alkaline Phosphatase: 52 U/L (ref 40–150)
BUN: 17.1 mg/dL (ref 7.0–26.0)
CO2: 26 mEq/L (ref 22–29)
Calcium: 9.4 mg/dL (ref 8.4–10.4)
Chloride: 108 mEq/L (ref 98–109)
Creatinine: 1.5 mg/dL — ABNORMAL HIGH (ref 0.7–1.3)
EGFR: 44 mL/min/{1.73_m2} — AB (ref >90)
GLUCOSE: 111 mg/dL (ref 70–140)
Potassium: 4.3 mEq/L (ref 3.5–5.1)
Sodium: 141 mEq/L (ref 136–145)
TOTAL PROTEIN: 6.6 g/dL (ref 6.4–8.3)
Total Bilirubin: 0.54 mg/dL (ref 0.20–1.20)

## 2015-01-11 NOTE — Telephone Encounter (Signed)
per pof to sch pt appt-gave pt copy of avs °

## 2015-01-11 NOTE — Progress Notes (Signed)
Hematology and Oncology Follow Up Visit  Danh Bayus [id number] 1931-07-10 81 y.o. 01/11/2015 12:23 PM [name], MDPerini, [name], MD   Principle Diagnosis: 81 year old gentleman with castration resistant prostate cancer with metastatic disease to the bone as well as pelvic adenopathy. He was diagnosed in 2007 with a Gleason score 4+3 equals 7 and a PSA of 4.   Prior Therapy:  He received a definitive therapy with radiation therapy and developed biochemical relapse initially treated with androgen deprivation.  He developed bony metastasis including a frontal bone metastasis and was treated with radiation therapy in February of 2013. Most recently his PSA was up to 13.38 despite castration levels of testosterone. His most recent bone scan in June of 2015 showed progression of disease.   Current therapy: Xtandi 160 mg daily started in August of 2015.  Interim History: Mr. Moradi presents today for a followup visit accompanied by his wife. Since his last visit, he reports no new issues. He reports no complications from [place]. He does report occasional mild nausea that lasts a few hours but able to eat well afterwards. He states that he has approximately 3 doses Xtandi. The group that was supplying his [name] has run out of funding. Patient states he cannot afford this medication on his own. Her in what other options he has for treatment. He has not reported any loss and his appetite or weight. He continues to exercise regularly. He continues to be active and performs activities of daily living. He does not report any seizure activity or changes in his performance status.  He has not reported any new bone pain or back pain. Has not reported any new constitutional symptoms.  He does not report any headaches or blurry vision or double vision. Does not report any syncope or seizures. Appetite and performance status remain stable. Does not report any chest pain shortness of breath or cough. As that  report any hemoptysis or hematemesis. Does not report any leg edema or palpitation. Does not report any frequency urgency or hesitancy. Does not report any skeletal complaints back pain shoulder pain or hip pain. Does not report any arthralgias or myalgias. He does not report any rashes or lesions or petechiae. Remainder of his review of systems unremarkable   Medications:   Current Outpatient Prescriptions  Medication Sig Dispense Refill  . allopurinol (ZYLOPRIM) 300 MG tablet Take 300 mg by mouth daily.  3  . atorvastatin (LIPITOR) 20 MG tablet Take 20 mg by mouth daily.  4  . diphenhydrAMINE (BENADRYL) 25 MG tablet Take 25 mg by mouth every 6 (six) hours as needed.    Marland Kitchen LORazepam (ATIVAN) 1 MG tablet as directed.  1  . omeprazole (PRILOSEC) 20 MG capsule Take 20 mg by mouth daily.    . ondansetron (ZOFRAN-ODT) 4 MG disintegrating tablet Take 4 mg by mouth every 8 (eight) hours as needed for nausea or vomiting.    . promethazine (PHENERGAN) 12.5 MG tablet Take 12.5 mg by mouth every 6 (six) hours as needed. for nausea  1  . simethicone (MYLICON) 80 MG chewable tablet Chew 80 mg by mouth every 6 (six) hours as needed.    . Tamsulosin HCl (FLOMAX) 0.4 MG CAPS Take by mouth.    . Vitamin D, Cholecalciferol, 400 UNITS CHEW Chew 800 Units by mouth daily.    [name] 40 MG capsule TAKE 4 CAPSULES BY MOUTH DAILY. 120 capsule 0  . XTANDI 40 MG capsule TAKE 4 CAPSULES BY MOUTH DAILY 120 capsule  0   No current facility-administered medications for this visit.     Allergies:  No Active Allergies  Past Medical History, Surgical history, Social history, and Family History were reviewed and updated.   Physical Exam: Blood pressure 143/77, pulse 79, temperature 98 F (36.7 C), resp. rate 18, weight 211 lb 4 oz (95.822 kg), SpO2 99 %. ECOG: 1 General appearance: alert and cooperative well-appearing gentleman without any distress. Head: Normocephalic, without obvious abnormality Neck: No masses  palpated. Lymph nodes: Cervical, supraclavicular, and axillary nodes normal. Heart:regular rate and rhythm, S1, S2 normal, no murmur, click, rub or gallop Lung:chest clear, no wheezing, rales, normal symmetric air entry Abdomen: soft, non-tender, without masses or organomegaly EXT:no erythema, induration, or nodules Skin:  No rashes or lesions.  Lab Results: Lab Results  Component Value Date   WBC 5.8 01/11/2015   HGB 13.4 01/11/2015   HCT 40.5 01/11/2015   MCV 94.0 01/11/2015   PLT 155 01/11/2015     Chemistry      Component Value Date/Time   NA 141 01/11/2015 1106   NA 135 01/23/2014 1055   K 4.3 01/11/2015 1106   K 4.7 01/23/2014 1055   CL 102 01/23/2014 1055   CO2 26 01/11/2015 1106   CO2 27 01/23/2014 1055   BUN 17.1 01/11/2015 1106   BUN 24* 01/23/2014 1055   CREATININE 1.5* 01/11/2015 1106   CREATININE 1.60* 01/23/2014 1055      Component Value Date/Time   CALCIUM 9.4 01/11/2015 1106   CALCIUM 9.0 01/23/2014 1055   ALKPHOS 52 01/11/2015 1106   ALKPHOS 53 01/23/2014 1055   AST 17 01/11/2015 1106   AST 18 01/23/2014 1055   ALT 13 01/11/2015 1106   ALT 16 01/23/2014 1055   BILITOT 0.54 01/11/2015 1106   BILITOT 0.4 01/23/2014 1055      Results for KASHIS, PENLEY (MRN [id number]) as of 12/07/2014 12:54  Ref. Range 08/01/2014 11:17 09/03/2014 10:38 10/03/2014 12:31 11/09/2014 11:01  PSA Latest Ref Range: <=4.00 ng/mL 3.13 3.35 3.71 3.42       Impression and Plan:  81 year old with:  1. Castration resistant prostate cancer with metastatic disease to the bone. His initial diagnosis was 2007 with a Gleason score 4+3 equal 7 and a PSA of 4.   He is currently on Xtandi and has tolerated it well. His PSA dropped from 13.38 to 3.14. His PSA continues to be under reasonable control. He is tolerating this medication well and plan on to continue with the same dose and schedule. No need for any dose reduction or delay at this time. He is to complete his current supply. I  have made her managed care Department aware of Mr. Tutterow of issue with coverage for his extended. They will explore other options to get this medication paid for. If we are unable to find other funding for this medication, we'll consider changing him to a different medication for treatment.  2. Androgen depravation: Recommended  that he continues on the current androgen depravation therapy. He is receiving this at [facility] urology.  3. Bone directed therapy: He is currently on Xgeva an we recommend for him to continue monthly. He is receiving it at [facility] urology. Continue to educate him about osteonecrosis of the job and other complications.  4. Weight loss: His weight continue to be stable at this time.  5. Followup: Will be in 4-5 weeks for an evaluation.  Patient reviewed with Dr. Celene Squibb, [place],  PA-C  7/29/201612:23 PM

## 2015-01-12 LAB — PSA: PSA: 3.14 ng/mL (ref <=4.00)

## 2015-01-13 NOTE — Patient Instructions (Signed)
Complete your current supply of Xtandi at the current dose. We are looking for alternative funding sources to continue this medication for you. We will consider other medications we are unable to find other funding for the Xtandi Follow-up in one month

## 2015-01-23 ENCOUNTER — Encounter: Payer: Self-pay | Admitting: Skilled Nursing Facility1

## 2015-01-23 NOTE — Progress Notes (Signed)
Subjective:     Patient ID: Eric Deleon, male   DOB: May 23, 1932, 81 y.o.   MRN: [id number]  HPI   Review of Systems     Objective:   Physical Exam To assist the pt in identifying dietary strategies to gain some lost wt back.    Assessment:     Pt identified as being malnourished due to losing some wt. Pt was contacted via the telephone at [phone]. Pt was hard of hearing so his wife spoke on his behalf. Pts wife states the pt is currently 210 pounds and is losing wt a little faster. Pts wife states the pt drinks 1-2 ensures a day.    Plan:     Dietitian offered the option of making the pts ensures into a milkshakes and to offer her husband something to eat every 3-4 hours, whether it be a snack or a meal. Dietitian praised the pts wife for doing a great job and to call if she had any questions.

## 2015-01-25 ENCOUNTER — Other Ambulatory Visit: Payer: Self-pay | Admitting: *Deleted

## 2015-01-25 MED ORDER — ENZALUTAMIDE 40 MG PO CAPS: 160.0000 mg | ORAL_CAPSULE | Freq: Every day | ORAL | Status: DC

## 2015-01-30 DIAGNOSIS — C7951 Secondary malignant neoplasm of bone: Secondary | ICD-10-CM | POA: Diagnosis not present

## 2015-01-30 DIAGNOSIS — C61 Malignant neoplasm of prostate: Secondary | ICD-10-CM | POA: Diagnosis not present

## 2015-02-08 ENCOUNTER — Ambulatory Visit (HOSPITAL_BASED_OUTPATIENT_CLINIC_OR_DEPARTMENT_OTHER): Payer: Medicare Other | Admitting: Oncology

## 2015-02-08 ENCOUNTER — Encounter: Payer: Self-pay | Admitting: *Deleted

## 2015-02-08 ENCOUNTER — Other Ambulatory Visit (HOSPITAL_BASED_OUTPATIENT_CLINIC_OR_DEPARTMENT_OTHER): Payer: Medicare Other

## 2015-02-08 ENCOUNTER — Telehealth: Payer: Self-pay | Admitting: Oncology

## 2015-02-08 VITALS — BP 126/82 | HR 85 | Temp 98.1°F | Resp 18 | Ht 71.0 in | Wt 212.4 lb

## 2015-02-08 DIAGNOSIS — C61 Malignant neoplasm of prostate: Secondary | ICD-10-CM | POA: Diagnosis not present

## 2015-02-08 DIAGNOSIS — E291 Testicular hypofunction: Secondary | ICD-10-CM

## 2015-02-08 DIAGNOSIS — C7951 Secondary malignant neoplasm of bone: Secondary | ICD-10-CM

## 2015-02-08 DIAGNOSIS — R634 Abnormal weight loss: Secondary | ICD-10-CM

## 2015-02-08 LAB — CBC WITH DIFFERENTIAL/PLATELET
BASO%: 0.9 % (ref 0.0–2.0)
BASOS ABS: 0.1 10*3/uL (ref 0.0–0.1)
EOS%: 2.4 % (ref 0.0–7.0)
Eosinophils Absolute: 0.1 10*3/uL (ref 0.0–0.5)
HCT: 39.7 % (ref 38.4–49.9)
HGB: 13.3 g/dL (ref 13.0–17.1)
LYMPH%: 20.3 % (ref 14.0–49.0)
MCH: 31.4 pg (ref 27.2–33.4)
MCHC: 33.5 g/dL (ref 32.0–36.0)
MCV: 93.8 fL (ref 79.3–98.0)
MONO#: 0.3 10*3/uL (ref 0.1–0.9)
MONO%: 5.7 % (ref 0.0–14.0)
NEUT#: 4.2 10*3/uL (ref 1.5–6.5)
NEUT%: 70.7 % (ref 39.0–75.0)
Platelets: 141 10*3/uL (ref 140–400)
RBC: 4.24 10*6/uL (ref 4.20–5.82)
RDW: 14.7 % — ABNORMAL HIGH (ref 11.0–14.6)
WBC: 6 10*3/uL (ref 4.0–10.3)
lymph#: 1.2 10*3/uL (ref 0.9–3.3)

## 2015-02-08 LAB — COMPREHENSIVE METABOLIC PANEL (CC13)
ALT: 16 U/L (ref 0–55)
AST: 20 U/L (ref 5–34)
Albumin: 3.5 g/dL (ref 3.5–5.0)
Alkaline Phosphatase: 54 U/L (ref 40–150)
Anion Gap: 8 mEq/L (ref 3–11)
BUN: 16.4 mg/dL (ref 7.0–26.0)
CHLORIDE: 107 meq/L (ref 98–109)
CO2: 26 mEq/L (ref 22–29)
Calcium: 9.1 mg/dL (ref 8.4–10.4)
Creatinine: 1.5 mg/dL — ABNORMAL HIGH (ref 0.7–1.3)
EGFR: 45 mL/min/{1.73_m2} — AB (ref >90)
GLUCOSE: 109 mg/dL (ref 70–140)
POTASSIUM: 4.6 meq/L (ref 3.5–5.1)
SODIUM: 141 meq/L (ref 136–145)
Total Bilirubin: 0.44 mg/dL (ref 0.20–1.20)
Total Protein: 6.3 g/dL — ABNORMAL LOW (ref 6.4–8.3)

## 2015-02-08 NOTE — Telephone Encounter (Signed)
Pt confirmed labs/ov per 08/26 POF, gave pt avs and calendar... KJ °

## 2015-02-08 NOTE — Progress Notes (Signed)
Hematology and Oncology Follow Up Visit  Eric Deleon [id number] 26-Jun-1931 81 y.o. 02/08/2015 10:28 AM Eric Deleon, MDPerini, [name], MD   Principle Diagnosis: 81 year old gentleman with castration resistant prostate cancer with metastatic disease to the bone as well as pelvic adenopathy. He was diagnosed in 2007 with a Gleason score 4+3 equals 7 and a PSA of 4.   Prior Therapy:  He received a definitive therapy with radiation therapy and developed biochemical relapse initially treated with androgen deprivation.  He developed bony metastasis including a frontal bone metastasis and was treated with radiation therapy in February of 2013. Most recently his PSA was up to 13.38 despite castration levels of testosterone. His most recent bone scan in June of 2015 showed progression of disease.   Current therapy: Xtandi 160 mg daily started in August of 2015.  Interim History: Mr. Dines presents today for a followup visit accompanied by his wife. Since his last visit, he continues to do very well. He reports no complications from Eric Deleon. Although he has not been taken for the last week as he is no longer getting financial help to get this medication.  He continues to be active and performs activities of daily living. He does not report any seizure activity or changes in his performance status.  He has not reported any new bone pain or back pain. Has not reported any new constitutional symptoms.  He does not report any headaches or blurry vision or double vision. Does not report any syncope or seizures. Appetite and performance status remain stable. Does not report any chest pain shortness of breath or cough. As that report any hemoptysis or hematemesis. Does not report any leg edema or palpitation. Does not report any frequency urgency or hesitancy. Does not report any skeletal complaints back pain shoulder pain or hip pain. Does not report any arthralgias or myalgias. He does not report any rashes or  lesions or petechiae. Remainder of his review of systems unremarkable   Medications:   Current Outpatient Prescriptions  Medication Sig Dispense Refill  . allopurinol (ZYLOPRIM) 300 MG tablet Take 300 mg by mouth daily.  3  . atorvastatin (LIPITOR) 20 MG tablet Take 20 mg by mouth daily.  4  . diphenhydrAMINE (BENADRYL) 25 MG tablet Take 25 mg by mouth every 6 (six) hours as needed.    Marland Kitchen LORazepam (ATIVAN) 1 MG tablet as directed.  1  . omeprazole (PRILOSEC) 20 MG capsule Take 20 mg by mouth daily.    . promethazine (PHENERGAN) 12.5 MG tablet Take 12.5 mg by mouth every 6 (six) hours as needed. for nausea  1  . simethicone (MYLICON) 80 MG chewable tablet Chew 80 mg by mouth every 6 (six) hours as needed.    . Tamsulosin HCl (FLOMAX) 0.4 MG CAPS Take by mouth.    . Vitamin D, Cholecalciferol, 400 UNITS CHEW Chew 800 Units by mouth daily.    . enzalutamide (XTANDI) 40 MG capsule Take 4 capsules (160 mg total) by mouth daily. (Patient not taking: Reported on 02/08/2015) 120 capsule 0  . ondansetron (ZOFRAN-ODT) 4 MG disintegrating tablet Take 4 mg by mouth every 8 (eight) hours as needed for nausea or vomiting.     No current facility-administered medications for this visit.     Allergies:  No Active Allergies  Past Medical History, Surgical history, Social history, and Family History were reviewed and updated.   Physical Exam: Blood pressure 126/82, pulse 85, temperature 98.1 F (36.7 C), temperature source Oral, resp. rate 18,  height 5\' 11"  (1.803 m), weight 212 lb 6.4 oz (96.344 kg), SpO2 100 %. ECOG: 1 General appearance: alert and cooperative  not in any distress. Head: Normocephalic, without obvious abnormality Neck: No masses palpated. Lymph nodes: Cervical, supraclavicular, and axillary nodes normal. Heart:regular rate and rhythm, S1, S2 normal, no murmur, click, rub or gallop Lung:chest clear, no wheezing, rales, normal symmetric air entry Abdomen: soft, non-tender, without  masses or organomegaly EXT:no erythema, induration, or nodules Skin:  No rashes or lesions.  Lab Results: Lab Results  Component Value Date   WBC 6.0 02/08/2015   HGB 13.3 02/08/2015   HCT 39.7 02/08/2015   MCV 93.8 02/08/2015   PLT 141 02/08/2015     Chemistry      Component Value Date/Time   NA 141 01/11/2015 1106   NA 135 01/23/2014 1055   K 4.3 01/11/2015 1106   K 4.7 01/23/2014 1055   CL 102 01/23/2014 1055   CO2 26 01/11/2015 1106   CO2 27 01/23/2014 1055   BUN 17.1 01/11/2015 1106   BUN 24* 01/23/2014 1055   CREATININE 1.5* 01/11/2015 1106   CREATININE 1.60* 01/23/2014 1055      Component Value Date/Time   CALCIUM 9.4 01/11/2015 1106   CALCIUM 9.0 01/23/2014 1055   ALKPHOS 52 01/11/2015 1106   ALKPHOS 53 01/23/2014 1055   AST 17 01/11/2015 1106   AST 18 01/23/2014 1055   ALT 13 01/11/2015 1106   ALT 16 01/23/2014 1055   BILITOT 0.54 01/11/2015 1106   BILITOT 0.4 01/23/2014 1055       Results for Eric Deleon (MRN [id number]) as of 02/08/2015 10:00  Ref. Range 11/09/2014 11:01 12/07/2014 12:31 01/11/2015 11:06  PSA Latest Ref Range: <=4.00 ng/mL 3.42 3.05 3.14     Impression and Plan:  81 year old with:  1. Castration resistant prostate cancer with metastatic disease to the bone. His initial diagnosis was 2007 with a Gleason score 4+3 equal 7 and a PSA of 4.   He is currently on Xtandi and has tolerated it well. His PSA dropped from 13.38 to 3.14.   His PSA continues to be under excellent control for the last year on this medication. He has no complications and my preference is to keep him on the current dose and schedule. He is running into some financial difficulties and unable to obtain this drug for the last week. We will explore other possibility to get him this medication at a reduced cost if that cannot be done I will have to switch him to [facility].  Risks and benefits and complications associated with Zytiga were detailed today. These include nausea,  fatigue, lower tremor edema, increased transaminases, electrolyte imbalance and adrenal insufficiency. He is willing to try different medication if we are unable to refill his xtandi.  2. Androgen depravation: Recommended  that he continues on the current androgen depravation therapy. He is receiving this at [facility] urology.  3. Bone directed therapy: He is currently on Xgeva an we recommend for him to continue monthly. He is receiving it at [facility] urology. Continue to educate him about osteonecrosis of the job and other complications.  4. Weight loss: His weight continue to be stable at this time.  5. Followup: Will be in one month to check on his status.   [facility], MD 8/26/201610:28 AM

## 2015-02-09 LAB — PSA: PSA: 2.89 ng/mL (ref <=4.00)

## 2015-02-11 ENCOUNTER — Encounter: Payer: Self-pay | Admitting: Oncology

## 2015-02-11 NOTE — Progress Notes (Signed)
Submitted application to [employer] for copay assistance for [place].  Waiting for approval.

## 2015-02-26 ENCOUNTER — Encounter: Payer: Self-pay | Admitting: Oncology

## 2015-02-26 NOTE — Progress Notes (Signed)
[employer] received application for copay assistance for Xtandi but the funding is now closed for that Dx.  They sent pt a letter informing him of this on 02/12/15.  I will call pt to make sure he received the letter.

## 2015-03-08 DIAGNOSIS — C61 Malignant neoplasm of prostate: Secondary | ICD-10-CM | POA: Diagnosis not present

## 2015-03-08 DIAGNOSIS — C7951 Secondary malignant neoplasm of bone: Secondary | ICD-10-CM | POA: Diagnosis not present

## 2015-03-13 ENCOUNTER — Other Ambulatory Visit: Payer: Medicare Other

## 2015-03-13 ENCOUNTER — Ambulatory Visit: Payer: Medicare Other | Admitting: Oncology

## 2015-03-20 ENCOUNTER — Telehealth: Payer: Self-pay | Admitting: Oncology

## 2015-03-20 ENCOUNTER — Ambulatory Visit (HOSPITAL_BASED_OUTPATIENT_CLINIC_OR_DEPARTMENT_OTHER): Payer: Medicare Other | Admitting: Oncology

## 2015-03-20 ENCOUNTER — Other Ambulatory Visit (HOSPITAL_BASED_OUTPATIENT_CLINIC_OR_DEPARTMENT_OTHER): Payer: Medicare Other

## 2015-03-20 VITALS — BP 128/66 | HR 66 | Temp 97.8°F | Resp 19 | Ht 71.0 in | Wt 214.5 lb

## 2015-03-20 DIAGNOSIS — C7951 Secondary malignant neoplasm of bone: Secondary | ICD-10-CM | POA: Diagnosis not present

## 2015-03-20 DIAGNOSIS — E291 Testicular hypofunction: Secondary | ICD-10-CM | POA: Diagnosis not present

## 2015-03-20 DIAGNOSIS — C61 Malignant neoplasm of prostate: Secondary | ICD-10-CM | POA: Diagnosis not present

## 2015-03-20 DIAGNOSIS — R634 Abnormal weight loss: Secondary | ICD-10-CM | POA: Diagnosis not present

## 2015-03-20 LAB — CBC WITH DIFFERENTIAL/PLATELET
BASO%: 1 % (ref 0.0–2.0)
BASOS ABS: 0 10*3/uL (ref 0.0–0.1)
EOS ABS: 0.1 10*3/uL (ref 0.0–0.5)
EOS%: 2.4 % (ref 0.0–7.0)
HEMATOCRIT: 37.9 % — AB (ref 38.4–49.9)
HEMOGLOBIN: 12.5 g/dL — AB (ref 13.0–17.1)
LYMPH#: 1.1 10*3/uL (ref 0.9–3.3)
LYMPH%: 23.2 % (ref 14.0–49.0)
MCH: 31.2 pg (ref 27.2–33.4)
MCHC: 33 g/dL (ref 32.0–36.0)
MCV: 94.3 fL (ref 79.3–98.0)
MONO#: 0.3 10*3/uL (ref 0.1–0.9)
MONO%: 6.6 % (ref 0.0–14.0)
NEUT#: 3.1 10*3/uL (ref 1.5–6.5)
NEUT%: 66.8 % (ref 39.0–75.0)
PLATELETS: 147 10*3/uL (ref 140–400)
RBC: 4.02 10*6/uL — ABNORMAL LOW (ref 4.20–5.82)
RDW: 14.9 % — AB (ref 11.0–14.6)
WBC: 4.7 10*3/uL (ref 4.0–10.3)

## 2015-03-20 LAB — COMPREHENSIVE METABOLIC PANEL (CC13)
ALBUMIN: 3.6 g/dL (ref 3.5–5.0)
ALK PHOS: 48 U/L (ref 40–150)
ALT: 23 U/L (ref 0–55)
ANION GAP: 7 meq/L (ref 3–11)
AST: 23 U/L (ref 5–34)
BUN: 21 mg/dL (ref 7.0–26.0)
CALCIUM: 8.8 mg/dL (ref 8.4–10.4)
CHLORIDE: 108 meq/L (ref 98–109)
CO2: 25 mEq/L (ref 22–29)
Creatinine: 1.7 mg/dL — ABNORMAL HIGH (ref 0.7–1.3)
EGFR: 37 mL/min/{1.73_m2} — AB (ref >90)
Glucose: 82 mg/dl (ref 70–140)
POTASSIUM: 3.8 meq/L (ref 3.5–5.1)
Sodium: 141 mEq/L (ref 136–145)
Total Bilirubin: 0.5 mg/dL (ref 0.20–1.20)
Total Protein: 6.2 g/dL — ABNORMAL LOW (ref 6.4–8.3)

## 2015-03-20 NOTE — Progress Notes (Signed)
Hematology and Oncology Follow Up Visit  Eric Deleon [id number] 12-10-1931 81 y.o. 03/20/2015 9:52 AM Eric Deleon, MDPerini, [name], MD   Principle Diagnosis: 81 year old gentleman with castration resistant prostate cancer with metastatic disease to the bone as well as pelvic adenopathy. He was diagnosed in 2007 with a Gleason score 4+3 equals 7 and a PSA of 4.   Prior Therapy:  He received a definitive therapy with radiation therapy and developed biochemical relapse initially treated with androgen deprivation.  He developed bony metastasis including a frontal bone metastasis and was treated with radiation therapy in February of 2013. Most recently his PSA was up to 13.38 despite castration levels of testosterone. His most recent bone scan in June of 2015 showed progression of disease.   Current therapy: Xtandi 160 mg daily started in August of 2015.  Interim History: Eric Deleon presents today for a followup visit accompanied by his wife. Since his last visit, he reports no complaints. He continues Xtandi without any new complications. He did miss 2 weeks related to lack of coverage from this medication but now he is back taking it.   He does not report any seizure activity or changes in his performance status.  He has not reported any new bone pain or back pain. Has not reported any new constitutional symptoms. He does not report any urinary complaints such as hematuria or dysuria.  He does not report any headaches or blurry vision or double vision. Does not report any syncope or seizures. Appetite and performance status remain stable. Does not report any chest pain shortness of breath or cough. As that report any hemoptysis or hematemesis. Does not report any leg edema or palpitation. Does not report any frequency urgency or hesitancy. Does not report any skeletal complaints back pain shoulder pain or hip pain. Does not report any arthralgias or myalgias. He does not report any rashes or lesions  or petechiae. Remainder of his review of systems unremarkable   Medications:   Current Outpatient Prescriptions  Medication Sig Dispense Refill  . allopurinol (ZYLOPRIM) 300 MG tablet Take 300 mg by mouth daily.  3  . atorvastatin (LIPITOR) 20 MG tablet Take 20 mg by mouth daily.  4  . diphenhydrAMINE (BENADRYL) 25 MG tablet Take 25 mg by mouth every 6 (six) hours as needed.    . enzalutamide (XTANDI) 40 MG capsule Take 4 capsules (160 mg total) by mouth daily. (Patient not taking: Reported on 02/08/2015) 120 capsule 0  . LORazepam (ATIVAN) 1 MG tablet as directed.  1  . omeprazole (PRILOSEC) 20 MG capsule Take 20 mg by mouth daily.    . ondansetron (ZOFRAN-ODT) 4 MG disintegrating tablet Take 4 mg by mouth every 8 (eight) hours as needed for nausea or vomiting.    . promethazine (PHENERGAN) 12.5 MG tablet Take 12.5 mg by mouth every 6 (six) hours as needed. for nausea  1  . simethicone (MYLICON) 80 MG chewable tablet Chew 80 mg by mouth every 6 (six) hours as needed.    . Tamsulosin HCl (FLOMAX) 0.4 MG CAPS Take by mouth.    . Vitamin D, Cholecalciferol, 400 UNITS CHEW Chew 800 Units by mouth daily.     No current facility-administered medications for this visit.     Allergies:  No Active Allergies  Past Medical History, Surgical history, Social history, and Family History were reviewed and updated.   Physical Exam: Blood pressure 128/66, pulse 66, temperature 97.8 F (36.6 C), temperature source Oral, resp. rate 19, height  5\' 11"  (1.803 m), weight 214 lb 8 oz (97.297 kg), SpO2 99 %. ECOG: 1 General appearance: alert and cooperative  well-appearing gentleman without distress. Head: Normocephalic, without obvious abnormality no oral ulcers or lesions. Neck: No masses palpated. Lymph nodes: Cervical, supraclavicular, and axillary nodes normal. Heart:regular rate and rhythm, S1, S2 normal, no murmur, click, rub or gallop Lung:chest clear, no wheezing, rales, normal symmetric air  entry Abdomen: soft, non-tender, without masses or organomegaly no shifting dullness or ascites. EXT:no erythema, induration, or nodules Skin:  No rashes or lesions.  Lab Results: Lab Results  Component Value Date   WBC 4.7 03/20/2015   HGB 12.5* 03/20/2015   HCT 37.9* 03/20/2015   MCV 94.3 03/20/2015   PLT 147 03/20/2015     Chemistry      Component Value Date/Time   NA 141 02/08/2015 0953   NA 135 01/23/2014 1055   K 4.6 02/08/2015 0953   K 4.7 01/23/2014 1055   CL 102 01/23/2014 1055   CO2 26 02/08/2015 0953   CO2 27 01/23/2014 1055   BUN 16.4 02/08/2015 0953   BUN 24* 01/23/2014 1055   CREATININE 1.5* 02/08/2015 0953   CREATININE 1.60* 01/23/2014 1055      Component Value Date/Time   CALCIUM 9.1 02/08/2015 0953   CALCIUM 9.0 01/23/2014 1055   ALKPHOS 54 02/08/2015 0953   ALKPHOS 53 01/23/2014 1055   AST 20 02/08/2015 0953   AST 18 01/23/2014 1055   ALT 16 02/08/2015 0953   ALT 16 01/23/2014 1055   BILITOT 0.44 02/08/2015 0953   BILITOT 0.4 01/23/2014 1055      Results for Eric Deleon, Eric Deleon (MRN [id number]) as of 03/20/2015 09:43  Ref. Range 12/07/2014 12:31 01/11/2015 11:06 02/08/2015 09:53  PSA Latest Ref Range: <=4.00 ng/mL 3.05 3.14 2.89       Impression and Plan:  81 year old with:  1. Castration resistant prostate cancer with metastatic disease to the bone. His initial diagnosis was 2007 with a Gleason score 4+3 equal 7 and a PSA of 4.   He is currently on Xtandi and has tolerated it well without any major complications. His PSA has dropped down further to 2.89.  I plan on continuing this current medication with the same dose and schedule. If coverage becomes an issue then I will switch him to [place].  Risks and benefits and complications associated with Zytiga were detailed again These include nausea, fatigue, lower tremor edema, increased transaminases, electrolyte imbalance and adrenal insufficiency.   We are hoping to continue Xtandi given the better  tolerance that he is experiencing.  2. Androgen depravation: Recommended  that he continues on the current androgen depravation therapy. He is receiving this at [facility] urology.  3. Bone directed therapy: He is currently on Xgeva an we recommend for him to continue monthly. He is receiving it at [facility] urology. Continue to educate him about osteonecrosis of the job and other complications.  4. Weight loss: His weight is relatively stable at this time.  5. Followup: Will be in 4-6 weeks to check his clinical status.   York Valliant, MD 10/5/20169:52 AM

## 2015-03-20 NOTE — Addendum Note (Signed)
Addended by: Randolm Idol on: 03/20/2015 09:59 AM   Modules accepted: Orders, Medications

## 2015-03-20 NOTE — Telephone Encounter (Signed)
Gave and pritned appt sched and avs for pt for NOV.Marland Kitchenthe patient wife wanted pt to come at later time appt moved to a week later

## 2015-03-21 ENCOUNTER — Other Ambulatory Visit: Payer: Self-pay | Admitting: Oncology

## 2015-03-21 LAB — PSA: PSA: 2.77 ng/mL (ref <=4.00)

## 2015-03-21 NOTE — Telephone Encounter (Signed)
-----   Message from Wyatt Portela, MD sent at 03/21/2015  8:26 AM EDT ----- Please call his PSA. Down again.

## 2015-03-21 NOTE — Telephone Encounter (Signed)
Spoke with patient. Gave results of last PSA 

## 2015-04-10 DIAGNOSIS — C7951 Secondary malignant neoplasm of bone: Secondary | ICD-10-CM | POA: Diagnosis not present

## 2015-04-10 DIAGNOSIS — E291 Testicular hypofunction: Secondary | ICD-10-CM | POA: Diagnosis not present

## 2015-04-10 DIAGNOSIS — N320 Bladder-neck obstruction: Secondary | ICD-10-CM | POA: Diagnosis not present

## 2015-04-10 DIAGNOSIS — C61 Malignant neoplasm of prostate: Secondary | ICD-10-CM | POA: Diagnosis not present

## 2015-04-12 DIAGNOSIS — Z23 Encounter for immunization: Secondary | ICD-10-CM | POA: Diagnosis not present

## 2015-04-18 ENCOUNTER — Encounter: Payer: Self-pay | Admitting: Oncology

## 2015-04-18 NOTE — Progress Notes (Signed)
Applied to [employer].  Pt is approved for Xtandi from 03/07/15 to 06/15/15.  [facility] will contact the pt to schedule delivery.

## 2015-04-22 ENCOUNTER — Other Ambulatory Visit: Payer: Self-pay | Admitting: Oncology

## 2015-04-22 ENCOUNTER — Telehealth: Payer: Self-pay | Admitting: *Deleted

## 2015-04-22 NOTE — Telephone Encounter (Signed)
Voicemail from patient's wife Pamala Hurry. 1. He needs a prescription for a refill on Xtandi.  Please call me at [phone] and I have a phone number and fax. 2. He missed taking this in October so can he be extended in November?

## 2015-04-23 ENCOUNTER — Other Ambulatory Visit: Payer: Self-pay | Admitting: *Deleted

## 2015-04-23 MED ORDER — ENZALUTAMIDE 40 MG PO CAPS: 160.0000 mg | ORAL_CAPSULE | Freq: Every day | ORAL | Status: DC

## 2015-04-23 NOTE — Telephone Encounter (Signed)
Spoke with patient, xtandi refilled and reminded him of regularly scheduled appt 05/10/15

## 2015-05-10 ENCOUNTER — Telehealth: Payer: Self-pay | Admitting: Oncology

## 2015-05-10 ENCOUNTER — Other Ambulatory Visit (HOSPITAL_BASED_OUTPATIENT_CLINIC_OR_DEPARTMENT_OTHER): Payer: Medicare Other

## 2015-05-10 ENCOUNTER — Ambulatory Visit (HOSPITAL_BASED_OUTPATIENT_CLINIC_OR_DEPARTMENT_OTHER): Payer: Medicare Other | Admitting: Oncology

## 2015-05-10 VITALS — BP 138/69 | HR 65 | Temp 97.8°F | Resp 18 | Ht 71.0 in | Wt 216.1 lb

## 2015-05-10 DIAGNOSIS — C7951 Secondary malignant neoplasm of bone: Secondary | ICD-10-CM

## 2015-05-10 DIAGNOSIS — C61 Malignant neoplasm of prostate: Secondary | ICD-10-CM

## 2015-05-10 DIAGNOSIS — E291 Testicular hypofunction: Secondary | ICD-10-CM

## 2015-05-10 LAB — CBC WITH DIFFERENTIAL/PLATELET
BASO%: 0.5 % (ref 0.0–2.0)
BASOS ABS: 0 10*3/uL (ref 0.0–0.1)
EOS ABS: 0.1 10*3/uL (ref 0.0–0.5)
EOS%: 1.9 % (ref 0.0–7.0)
HEMATOCRIT: 38.1 % — AB (ref 38.4–49.9)
HGB: 12.7 g/dL — ABNORMAL LOW (ref 13.0–17.1)
LYMPH#: 1.4 10*3/uL (ref 0.9–3.3)
LYMPH%: 23.8 % (ref 14.0–49.0)
MCH: 31.1 pg (ref 27.2–33.4)
MCHC: 33.3 g/dL (ref 32.0–36.0)
MCV: 93.4 fL (ref 79.3–98.0)
MONO#: 0.4 10*3/uL (ref 0.1–0.9)
MONO%: 6.3 % (ref 0.0–14.0)
NEUT#: 3.9 10*3/uL (ref 1.5–6.5)
NEUT%: 67.5 % (ref 39.0–75.0)
PLATELETS: 128 10*3/uL — AB (ref 140–400)
RBC: 4.08 10*6/uL — ABNORMAL LOW (ref 4.20–5.82)
RDW: 14.3 % (ref 11.0–14.6)
WBC: 5.7 10*3/uL (ref 4.0–10.3)

## 2015-05-10 LAB — COMPREHENSIVE METABOLIC PANEL (CC13)
ALBUMIN: 3.7 g/dL (ref 3.5–5.0)
ALK PHOS: 51 U/L (ref 40–150)
ALT: 14 U/L (ref 0–55)
ANION GAP: 8 meq/L (ref 3–11)
AST: 17 U/L (ref 5–34)
BUN: 18.3 mg/dL (ref 7.0–26.0)
CALCIUM: 8.9 mg/dL (ref 8.4–10.4)
CHLORIDE: 107 meq/L (ref 98–109)
CO2: 25 mEq/L (ref 22–29)
Creatinine: 1.7 mg/dL — ABNORMAL HIGH (ref 0.7–1.3)
EGFR: 38 mL/min/{1.73_m2} — ABNORMAL LOW (ref >90)
Glucose: 97 mg/dl (ref 70–140)
POTASSIUM: 4.8 meq/L (ref 3.5–5.1)
Sodium: 139 mEq/L (ref 136–145)
Total Bilirubin: 0.38 mg/dL (ref 0.20–1.20)
Total Protein: 6.3 g/dL — ABNORMAL LOW (ref 6.4–8.3)

## 2015-05-10 NOTE — Telephone Encounter (Signed)
per pof to sch pt appt-gave pt copy of avs °

## 2015-05-10 NOTE — Progress Notes (Signed)
Hematology and Oncology Follow Up Visit  Eric Deleon [id number] [birth year] 81 y.o. 05/10/2015 12:52 PM Eric Deleon, MDPerini, [name], MD   Principle Diagnosis: 81 year old gentleman with castration resistant prostate cancer with metastatic disease to the bone as well as pelvic adenopathy. He was diagnosed in 2007 with a Gleason score 4+3 equals 7 and a PSA of 4.   Prior Therapy:  He received a definitive therapy with radiation therapy and developed biochemical relapse initially treated with androgen deprivation.  He developed bony metastasis including a frontal bone metastasis and was treated with radiation therapy in February of 2013. Most recently his PSA was up to 13.38 despite castration levels of testosterone. His most recent bone scan in June of 2015 showed progression of disease.   Current therapy: Xtandi 160 mg daily started in August of 2015.  Interim History: Eric Deleon presents today for a followup visit accompanied by his wife. Since his last visit, he continues to do very well. He has not reported any recent illnesses or hospitalization. He continues Xtandi without any new complications. He does not report any nausea, abdominal pain or lower extremity edema. His blood pressure has been under reasonable control. He has been able to get the medication consistently and have been compliant with it.  He denied any skeletal complaints including arthralgias, myalgias or pathological fractures. Has not reported any changes in his activity level or quality of life.   He does not report any headaches or blurry vision or double vision. Does not report any syncope or seizures. Appetite and performance status remain stable. Does not report any chest pain shortness of breath or cough. As that report any hemoptysis or hematemesis. Does not report any leg edema or palpitation. Does not report any frequency urgency or hesitancy. Does not report any skeletal complaints back pain shoulder pain or hip  pain. Does not report any arthralgias or myalgias. He does not report any rashes or lesions or petechiae. Remainder of his review of systems unremarkable   Medications:   Current Outpatient Prescriptions  Medication Sig Dispense Refill  . allopurinol (ZYLOPRIM) 300 MG tablet Take 300 mg by mouth daily.  3  . atorvastatin (LIPITOR) 20 MG tablet Take 20 mg by mouth daily.  4  . diphenhydrAMINE (BENADRYL) 25 MG tablet Take 25 mg by mouth every 6 (six) hours as needed.    . enzalutamide (XTANDI) 40 MG capsule Take 4 capsules (160 mg total) by mouth daily. 120 capsule 0  . LORazepam (ATIVAN) 1 MG tablet as directed.  1  . omeprazole (PRILOSEC) 20 MG capsule Take 20 mg by mouth daily.    . promethazine (PHENERGAN) 12.5 MG tablet Take 12.5 mg by mouth every 6 (six) hours as needed. for nausea  1  . simethicone (MYLICON) 80 MG chewable tablet Chew 80 mg by mouth every 6 (six) hours as needed.    . Tamsulosin HCl (FLOMAX) 0.4 MG CAPS Take by mouth.    . Vitamin D, Cholecalciferol, 400 UNITS CHEW Chew 800 Units by mouth daily.     No current facility-administered medications for this visit.     Allergies:  No Known Allergies  Past Medical History, Surgical history, Social history, and Family History were reviewed and updated.   Physical Exam: Blood pressure 138/69, pulse 65, temperature 97.8 F (36.6 C), temperature source Oral, resp. rate 18, height 5\' 11"  (1.803 m), weight 216 lb 1.6 oz (98.022 kg), SpO2 100 %. ECOG: 1 General appearance: alert and cooperative  not in any  distress. Head: Normocephalic, without obvious abnormality no oral thrush. Neck: No masses palpated. Lymph nodes: Cervical, supraclavicular, and axillary nodes normal. Heart:regular rate and rhythm, S1, S2 normal, no murmur, click, rub or gallop Lung:chest clear, no wheezing, rales, normal symmetric air entry Abdomen: soft, non-tender, without masses or organomegaly no rebound or guarding. EXT:no erythema, induration,  or nodules Skin:  No rashes or lesions.  Lab Results: Lab Results  Component Value Date   WBC 5.7 05/10/2015   HGB 12.7* 05/10/2015   HCT 38.1* 05/10/2015   MCV 93.4 05/10/2015   PLT 128* 05/10/2015     Chemistry      Component Value Date/Time   NA 141 03/20/2015 0925   NA 135 01/23/2014 1055   K 3.8 03/20/2015 0925   K 4.7 01/23/2014 1055   CL 102 01/23/2014 1055   CO2 25 03/20/2015 0925   CO2 27 01/23/2014 1055   BUN 21.0 03/20/2015 0925   BUN 24* 01/23/2014 1055   CREATININE 1.7* 03/20/2015 0925   CREATININE 1.60* 01/23/2014 1055      Component Value Date/Time   CALCIUM 8.8 03/20/2015 0925   CALCIUM 9.0 01/23/2014 1055   ALKPHOS 48 03/20/2015 0925   ALKPHOS 53 01/23/2014 1055   AST 23 03/20/2015 0925   AST 18 01/23/2014 1055   ALT 23 03/20/2015 0925   ALT 16 01/23/2014 1055   BILITOT 0.50 03/20/2015 0925   BILITOT 0.4 01/23/2014 1055        Results for Eric Deleon (MRN [id number]) as of 05/10/2015 12:39  Ref. Range 02/08/2015 09:53 03/20/2015 09:25  PSA Latest Ref Range: <=4.00 ng/mL 2.89 2.77      Impression and Plan:  81 year old with:  1. Castration resistant prostate cancer with metastatic disease to the bone. His initial diagnosis was 2007 with a Gleason score 4+3 equal 7 and a PSA of 4.   He is currently on Xtandi and has tolerated it well without any major complications. His PSA continues to respond with dropping down to 2.77 from 13. His quality of life continues to be improving of this medication and plan to keep him on the same dose and schedule. He is getting assistance in getting this medication at least for the time being. Zytiga can be used as an alternative if you progress on this medication or he is no longer able to get it at a reasonable price.  2. Androgen depravation: Recommended  that he continues on the current androgen depravation therapy. He is receiving this at [facility] urology.  3. Bone directed therapy: He is currently on Xgeva  an we recommend for him to continue monthly. He is receiving it at [facility] urology. Continue to educate him about osteonecrosis of the job and other complications.  4. Weight loss: His weight is stable at this time.  5. Followup: Will be in 6 weeks.  Zola Button, MD 11/25/201612:52 PM

## 2015-05-11 LAB — PSA: PSA: 3.15 ng/mL (ref <=4.00)

## 2015-05-13 ENCOUNTER — Other Ambulatory Visit: Payer: Self-pay | Admitting: Oncology

## 2015-05-13 ENCOUNTER — Telehealth: Payer: Self-pay | Admitting: *Deleted

## 2015-05-13 NOTE — Telephone Encounter (Signed)
-----   Message from Wyatt Portela, MD sent at 05/13/2015  8:31 AM EST ----- Please call his PSA. Slightly up but not a big deal at this time.

## 2015-05-13 NOTE — Telephone Encounter (Signed)
As noted below by Dr. Alen Blew, I informed patient's wife of his PSA results. Wife verbalized understanding.

## 2015-05-15 DIAGNOSIS — C7951 Secondary malignant neoplasm of bone: Secondary | ICD-10-CM | POA: Diagnosis not present

## 2015-05-15 DIAGNOSIS — C61 Malignant neoplasm of prostate: Secondary | ICD-10-CM | POA: Diagnosis not present

## 2015-06-18 DIAGNOSIS — C7951 Secondary malignant neoplasm of bone: Secondary | ICD-10-CM | POA: Diagnosis not present

## 2015-06-18 DIAGNOSIS — C61 Malignant neoplasm of prostate: Secondary | ICD-10-CM | POA: Diagnosis not present

## 2015-06-20 ENCOUNTER — Ambulatory Visit (HOSPITAL_BASED_OUTPATIENT_CLINIC_OR_DEPARTMENT_OTHER): Payer: Medicare Other | Admitting: Oncology

## 2015-06-20 ENCOUNTER — Other Ambulatory Visit (HOSPITAL_BASED_OUTPATIENT_CLINIC_OR_DEPARTMENT_OTHER): Payer: Medicare Other

## 2015-06-20 ENCOUNTER — Telehealth: Payer: Self-pay | Admitting: Oncology

## 2015-06-20 VITALS — BP 141/74 | HR 79 | Temp 97.8°F | Resp 18 | Wt 216.8 lb

## 2015-06-20 DIAGNOSIS — C61 Malignant neoplasm of prostate: Secondary | ICD-10-CM | POA: Diagnosis not present

## 2015-06-20 DIAGNOSIS — E291 Testicular hypofunction: Secondary | ICD-10-CM | POA: Diagnosis not present

## 2015-06-20 DIAGNOSIS — C7951 Secondary malignant neoplasm of bone: Secondary | ICD-10-CM

## 2015-06-20 LAB — CBC WITH DIFFERENTIAL/PLATELET
BASO%: 0.2 % (ref 0.0–2.0)
BASOS ABS: 0 10*3/uL (ref 0.0–0.1)
EOS ABS: 0 10*3/uL (ref 0.0–0.5)
EOS%: 0.5 % (ref 0.0–7.0)
HEMATOCRIT: 39.7 % (ref 38.4–49.9)
HEMOGLOBIN: 13.2 g/dL (ref 13.0–17.1)
LYMPH#: 0.8 10*3/uL — AB (ref 0.9–3.3)
LYMPH%: 14.5 % (ref 14.0–49.0)
MCH: 31.2 pg (ref 27.2–33.4)
MCHC: 33.2 g/dL (ref 32.0–36.0)
MCV: 93.9 fL (ref 79.3–98.0)
MONO#: 0.2 10*3/uL (ref 0.1–0.9)
MONO%: 4.1 % (ref 0.0–14.0)
NEUT%: 80.7 % — ABNORMAL HIGH (ref 39.0–75.0)
NEUTROS ABS: 4.7 10*3/uL (ref 1.5–6.5)
Platelets: 144 10*3/uL (ref 140–400)
RBC: 4.23 10*6/uL (ref 4.20–5.82)
RDW: 14.8 % — AB (ref 11.0–14.6)
WBC: 5.8 10*3/uL (ref 4.0–10.3)

## 2015-06-20 LAB — COMPREHENSIVE METABOLIC PANEL
ALBUMIN: 3.6 g/dL (ref 3.5–5.0)
ALK PHOS: 50 U/L (ref 40–150)
ALT: 17 U/L (ref 0–55)
AST: 26 U/L (ref 5–34)
Anion Gap: 6 mEq/L (ref 3–11)
BILIRUBIN TOTAL: 0.43 mg/dL (ref 0.20–1.20)
BUN: 15.7 mg/dL (ref 7.0–26.0)
CALCIUM: 8.7 mg/dL (ref 8.4–10.4)
CO2: 23 mEq/L (ref 22–29)
Chloride: 111 mEq/L — ABNORMAL HIGH (ref 98–109)
Creatinine: 1.5 mg/dL — ABNORMAL HIGH (ref 0.7–1.3)
EGFR: 44 mL/min/{1.73_m2} — AB (ref >90)
GLUCOSE: 101 mg/dL (ref 70–140)
POTASSIUM: 5.2 meq/L — AB (ref 3.5–5.1)
SODIUM: 140 meq/L (ref 136–145)
TOTAL PROTEIN: 6.6 g/dL (ref 6.4–8.3)

## 2015-06-20 NOTE — Progress Notes (Signed)
Hematology and Oncology Follow Up Visit  Eric Deleon [id number] 06-Sep-1931 82 y.o. 06/20/2015 1:03 PM Eric Deleon, MDPerini, [name], MD   Principle Diagnosis: 82 year old gentleman with castration resistant prostate cancer with metastatic disease to the bone as well as pelvic adenopathy. He was diagnosed in 2007 with a Gleason score 4+3 equals 7 and a PSA of 4.   Prior Therapy:  He received a definitive therapy with radiation therapy and developed biochemical relapse initially treated with androgen deprivation.  He developed bony metastasis including a frontal bone metastasis and was treated with radiation therapy in February of 2013. Most recently his PSA was up to 13.38 despite castration levels of testosterone. His most recent bone scan in June of 2015 showed progression of disease.   Current therapy: Xtandi 160 mg daily started in August of 2015.  Interim History: Eric Deleon presents today for a followup visit accompanied by his wife. Since his last visit, he reports no recent complaints. He continues Xtandi without any new complications. He denied any seizure or syncope. He does not report any nausea, abdominal pain or lower extremity edema. His blood pressure has been under reasonable control. He has been able to get the medication consistently and have been compliant with it. He has not reported any skeletal complaints including back pain, shoulder pain or pathological fractures. He continues to ambulate without any difficulties. Has not reported any further falls or decline in his performance status.   He does not report any headaches or blurry vision or double vision. Does not report any syncope or seizures. Appetite and performance status remain stable. Does not report any chest pain shortness of breath or cough. As that report any hemoptysis or hematemesis. Does not report any leg edema or palpitation. Does not report any frequency urgency or hesitancy. Does not report any skeletal  complaints back pain shoulder pain or hip pain. Does not report any arthralgias or myalgias. He does not report any rashes or lesions or petechiae. Remainder of his review of systems unremarkable   Medications:   Current Outpatient Prescriptions  Medication Sig Dispense Refill  . allopurinol (ZYLOPRIM) 300 MG tablet Take 300 mg by mouth daily.  3  . atorvastatin (LIPITOR) 20 MG tablet Take 20 mg by mouth daily.  4  . diphenhydrAMINE (BENADRYL) 25 MG tablet Take 25 mg by mouth every 6 (six) hours as needed.    Marland Kitchen LORazepam (ATIVAN) 1 MG tablet as directed.  1  . omeprazole (PRILOSEC) 20 MG capsule Take 20 mg by mouth daily.    . promethazine (PHENERGAN) 12.5 MG tablet Take 12.5 mg by mouth every 6 (six) hours as needed. for nausea  1  . simethicone (MYLICON) 80 MG chewable tablet Chew 80 mg by mouth every 6 (six) hours as needed.    . Tamsulosin HCl (FLOMAX) 0.4 MG CAPS Take by mouth.    . Vitamin D, Cholecalciferol, 400 UNITS CHEW Chew 800 Units by mouth daily.    [name] 40 MG capsule TAKE 4 CAPSULES (160MG ) BY MOUTH EVERY DAY 120 capsule 0   No current facility-administered medications for this visit.     Allergies:  No Known Allergies  Past Medical History, Surgical history, Social history, and Family History were reviewed and updated.   Physical Exam: Blood pressure 141/74, pulse 79, temperature 97.8 F (36.6 C), temperature source Oral, resp. rate 18, weight 216 lb 12.8 oz (98.34 kg). ECOG: 1 General appearance: alert and cooperative well-appearing gentleman. Head: Normocephalic, without obvious abnormality no oral  masses or lesions. Neck: No masses palpated. Lymph nodes: Cervical, supraclavicular, and axillary nodes normal. Heart:regular rate and rhythm, S1, S2 normal, no murmur, click, rub or gallop Lung:chest clear, no wheezing, rales, normal symmetric air entry Abdomen: soft, non-tender, without masses or organomegaly no shifting dullness or ascites. EXT:no erythema,  induration, or nodules Skin:  No rashes or lesions.  Lab Results: Lab Results  Component Value Date   WBC 5.8 06/20/2015   HGB 13.2 06/20/2015   HCT 39.7 06/20/2015   MCV 93.9 06/20/2015   PLT 144 06/20/2015     Chemistry      Component Value Date/Time   NA 139 05/10/2015 1227   NA 135 01/23/2014 1055   K 4.8 05/10/2015 1227   K 4.7 01/23/2014 1055   CL 102 01/23/2014 1055   CO2 25 05/10/2015 1227   CO2 27 01/23/2014 1055   BUN 18.3 05/10/2015 1227   BUN 24* 01/23/2014 1055   CREATININE 1.7* 05/10/2015 1227   CREATININE 1.60* 01/23/2014 1055      Component Value Date/Time   CALCIUM 8.9 05/10/2015 1227   CALCIUM 9.0 01/23/2014 1055   ALKPHOS 51 05/10/2015 1227   ALKPHOS 53 01/23/2014 1055   AST 17 05/10/2015 1227   AST 18 01/23/2014 1055   ALT 14 05/10/2015 1227   ALT 16 01/23/2014 1055   BILITOT 0.38 05/10/2015 1227   BILITOT 0.4 01/23/2014 1055       Results for Eric Deleon ([name]) as of 06/20/2015 12:57  Ref. Range 03/20/2015 09:25 05/10/2015 12:27  PSA Latest Ref Range: <=4.00 ng/mL 2.77 3.15     Impression and Plan:  82 year old with:  1. Castration resistant prostate cancer with metastatic disease to the bone. His initial diagnosis was 2007 with a Gleason score 4+3 equal 7 and a PSA of 4.   He is currently on Xtandi and has tolerated it well without any major complications. His PSA continues to be relatively stable ranging between 2 and 3. He does not report any side effects related to this medication and the plan is to continue the same dose and schedule. Different salvage therapy will be used in the future if his PSA starts to rise rapidly.  2. Androgen depravation: Recommended  that he continues on the current androgen depravation therapy. No complications related to that noted.  3. Bone directed therapy: He is currently on Xgeva an we recommend for him to continue monthly. He is receiving it at [facility] urology. Continue to educate him about  osteonecrosis of the job and other complications. He continues to take calcium and vitamin D.  4. Weight loss: Tablet remain excellent and his weight stable.  5. Followup: Will be in 6 to 7 weeks.  Zola Button, MD 1/5/20171:03 PM

## 2015-06-20 NOTE — Telephone Encounter (Signed)
per pof to sch pt appt-gave pt copy of scan

## 2015-06-21 ENCOUNTER — Telehealth: Payer: Self-pay | Admitting: *Deleted

## 2015-06-21 LAB — PSA: PSA: 3.26 ng/mL (ref <=4.00)

## 2015-06-21 NOTE — Telephone Encounter (Signed)
-----   Message from Wyatt Portela, MD sent at 06/21/2015  8:52 AM EST ----- Please call his PSA. Very slight change.

## 2015-06-21 NOTE — Telephone Encounter (Signed)
Spoke with patient,gave last PSA result

## 2015-07-01 ENCOUNTER — Encounter: Payer: Self-pay | Admitting: Oncology

## 2015-07-01 NOTE — Progress Notes (Signed)
[employer] renewed pt's approval for Xtandi from 06/28/15 to 06/14/16.

## 2015-07-01 NOTE — Progress Notes (Signed)
I fwd message to lenise to call wife about xtandi asst. See prev notes.

## 2015-07-08 DIAGNOSIS — Z125 Encounter for screening for malignant neoplasm of prostate: Secondary | ICD-10-CM | POA: Diagnosis not present

## 2015-07-08 DIAGNOSIS — N189 Chronic kidney disease, unspecified: Secondary | ICD-10-CM | POA: Diagnosis not present

## 2015-07-08 DIAGNOSIS — E784 Other hyperlipidemia: Secondary | ICD-10-CM | POA: Diagnosis not present

## 2015-07-08 DIAGNOSIS — R7301 Impaired fasting glucose: Secondary | ICD-10-CM | POA: Diagnosis not present

## 2015-07-08 DIAGNOSIS — M109 Gout, unspecified: Secondary | ICD-10-CM | POA: Diagnosis not present

## 2015-07-08 IMAGING — CT CT ABD-PELV W/ CM
2 of 4 series · 16 of 46 positions shown, 18 images · IV contrast (omnipaque)
Comparison: Whole body bone scan 12/06/2013. Abdominal pelvic CT
06/04/2011.

CLINICAL DATA: Restaging prostate cancer diagnosed 8 years ago
status post brachytherapy. Hormone therapy and progress.

EXAM:
CT CHEST, ABDOMEN, AND PELVIS WITH CONTRAST
TECHNIQUE: Multidetector CT imaging of the chest, abdomen and pelvis was
performed following the standard protocol during bolus
administration of intravenous contrast.
CONTRAST:  100mL OMNIPAQUE IOHEXOL 300 MG/ML  SOLN

[Series 2: cap with st · axial · 0.89mm/px · z∈[-646,-52]mm · 13 of 131 slices shown, 15 images]
[im 6/131  soft-tissue]
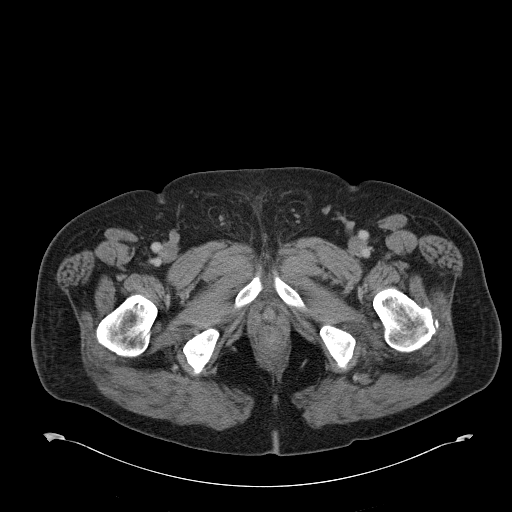
[im 6/131  bone]
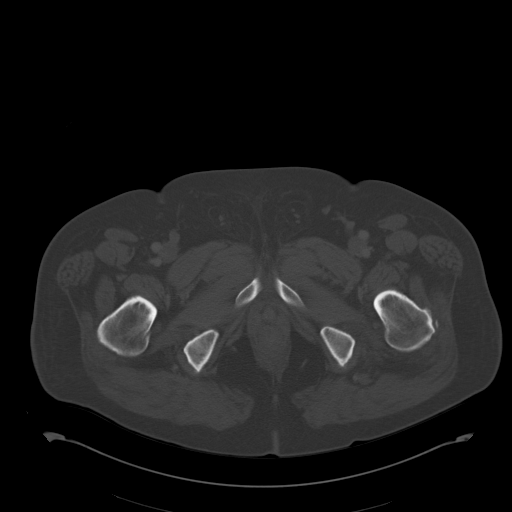
[im 17/131  soft-tissue]
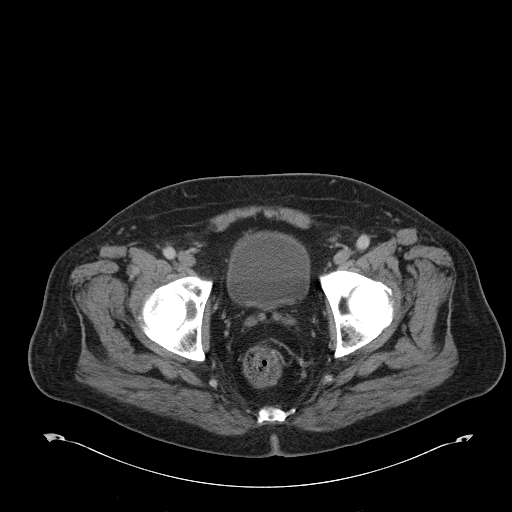
[im 28/131  soft-tissue]
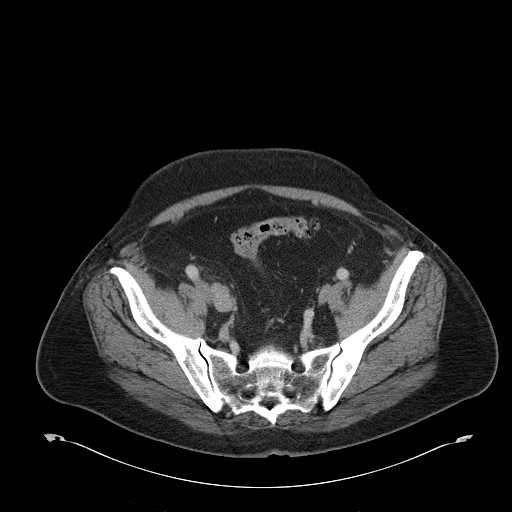
[im 38/131  soft-tissue]
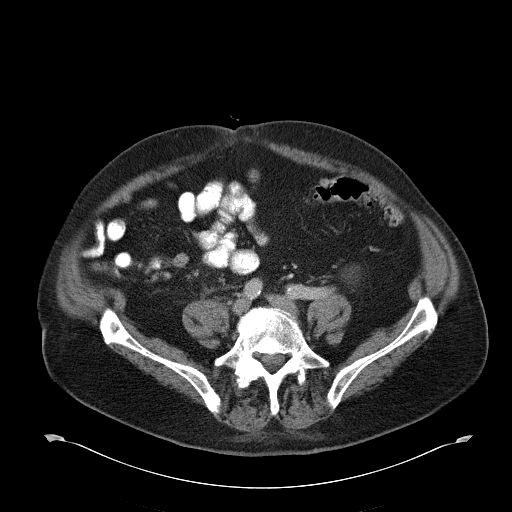
[im 44/131  soft-tissue]
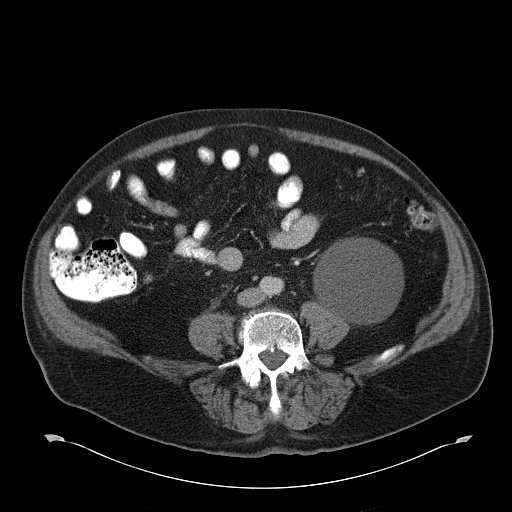
[im 55/131  soft-tissue]
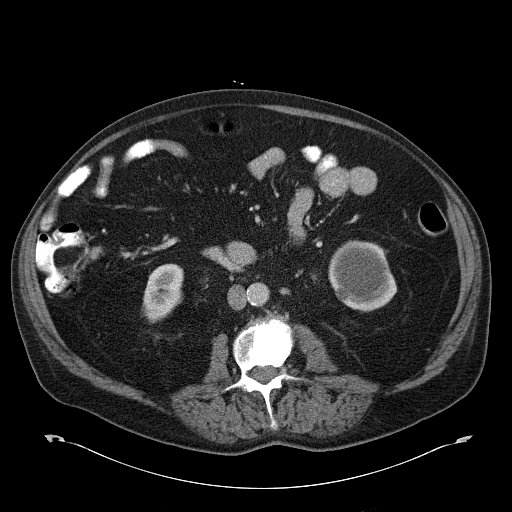
[im 66/131  soft-tissue]
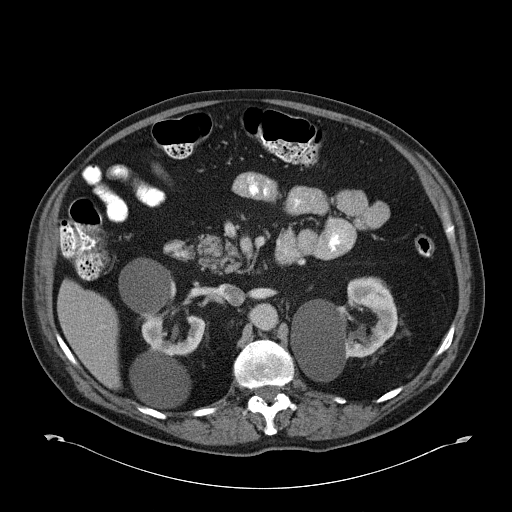
[im 76/131  soft-tissue]
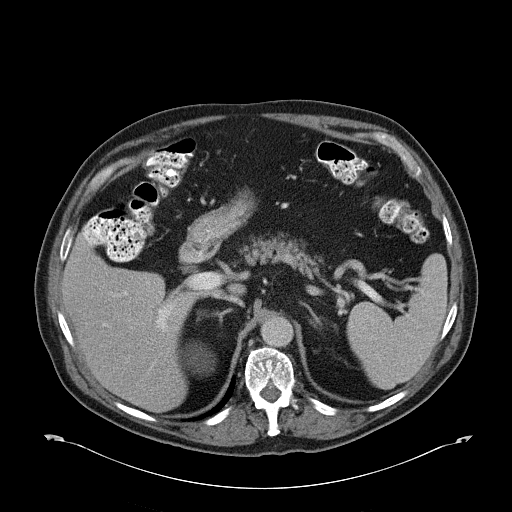
[im 87/131  soft-tissue]
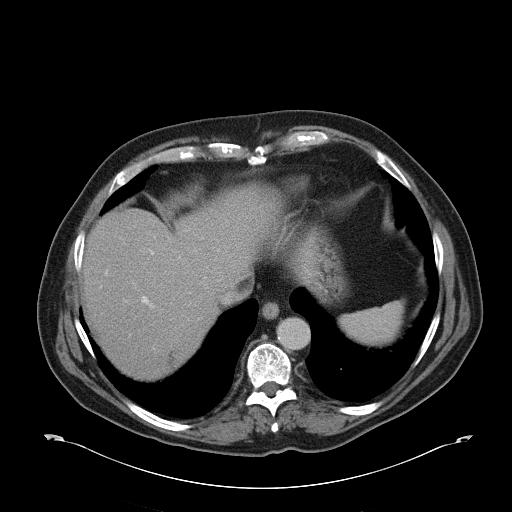
[im 87/131  bone]
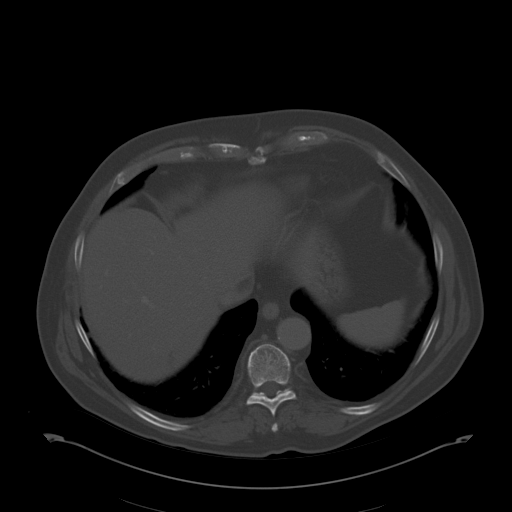
[im 93/131  soft-tissue]
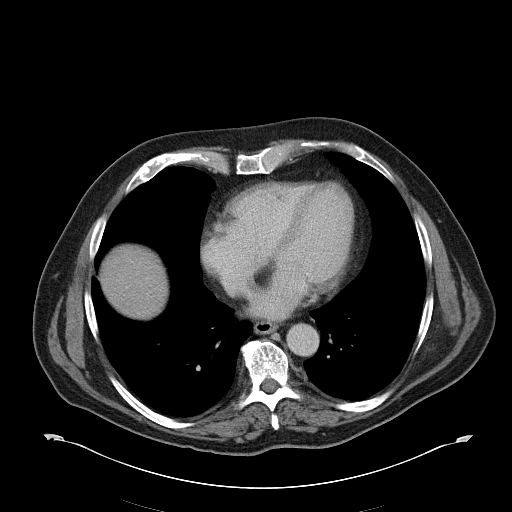
[im 103/131  soft-tissue]
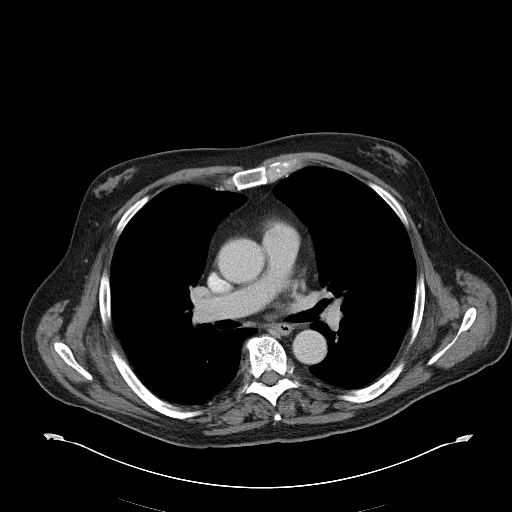
[im 114/131  soft-tissue]
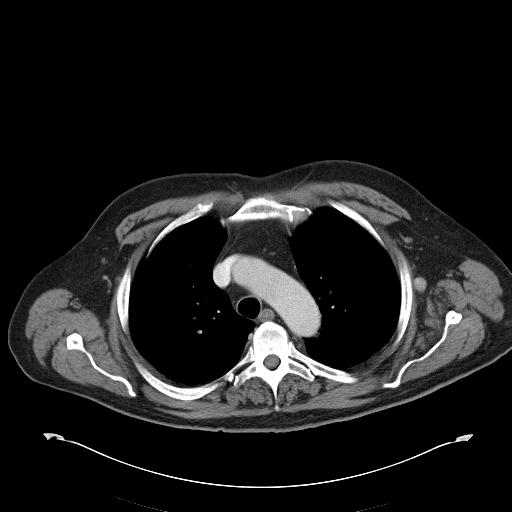
[im 125/131  soft-tissue]
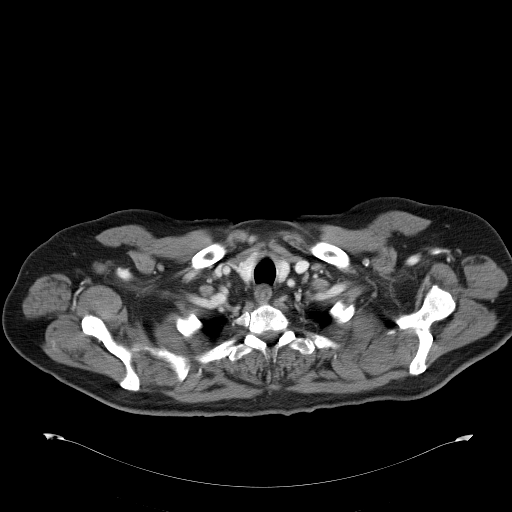

[Series 602: <mpr thick range> · coronal · 1.28mm/px · 3 of 130 slices shown]
[im 44/130  soft-tissue]
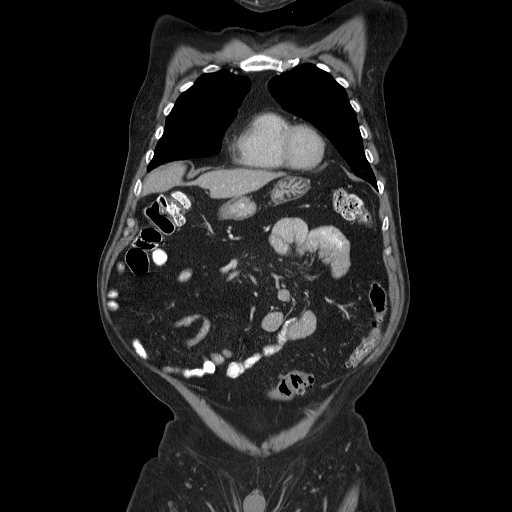
[im 58/130  soft-tissue]
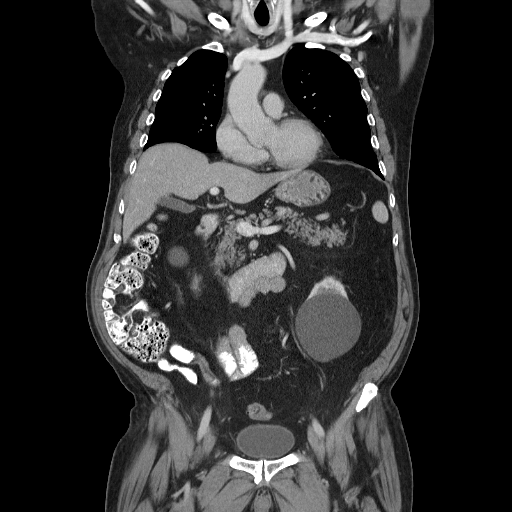
[im 72/130  soft-tissue]
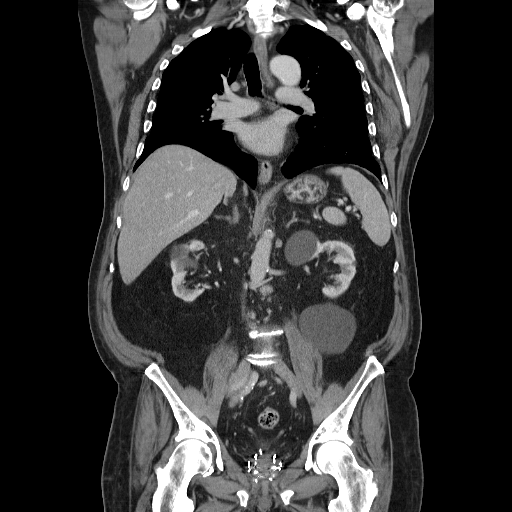

[16 of 46 positions shown; findings below may reference images not displayed]

FINDINGS: CT CHEST FINDINGS

Mediastinum: There are no enlarged mediastinal, hilar or axillary
lymph nodes. The thyroid gland, trachea and esophagus appear normal
aside from a small hiatal hernia. The heart size is normal.There is
minimal atherosclerosis.

Lungs/ Pleura: There is no pleural or pericardial effusion.There is
mild peripheral scarring at the left lung base. No confluent
airspace opacity or suspicious nodule demonstrated.

Musculoskeletal/Chest wall: Mild bilateral gynecomastia noted. There
are suspected blastic metastases within the left aspect of the T6
vertebral body (image 30) and within several ribs bilaterally.

CT ABDOMEN AND PELVIS FINDINGS

Ill-defined low-density posteriorly in the dome of the right hepatic
lobe on images 44 and 45 is stable and likely benign. There are no
suspicious liver lesions. The gallbladder, biliary system, pancreas,
spleen and adrenal glands appear normal.

Again demonstrated are multiple bilateral renal cysts. The largest
is in the lower pole of the left kidney, measuring 9.4 cm. No
enhancing renal mass or hydronephrosis demonstrated.

The stomach, small bowel and appendix demonstrate no abnormalities.
There diverticular changes of the sigmoid colon without surrounding
inflammation.

Prostate brachytherapy seeds are unchanged in position. The bladder
appears normal. There are right external iliac nodal masses
measuring 3.2 x 3.0 cm on image 98 and 2.8 x 1.6 cm on image 104. No
left pelvic or inguinal adenopathy is identified. Small
retroperitoneal lymph nodes are noted, including a retro aortic 11
mm node on image 75.

Multiple blastic osseous metastases are demonstrated. The largest is
in the L2 vertebral body, corresponding with the activity on the
recent bone scan. There are also lesions posteriorly in the L5
vertebral body, in both iliac bones and in the left pubic bones. No
pathologic fracture identified.
IMPRESSION: 1. Multiple osseous metastases within the chest, abdomen and pelvis.
2. Right pelvic and retroperitoneal lymphadenopathy consistent with
metastatic prostate cancer.
3. No evidence of bowel or ureteral obstruction.
4. Stable bilateral renal cysts.

## 2015-07-09 DIAGNOSIS — C61 Malignant neoplasm of prostate: Secondary | ICD-10-CM | POA: Diagnosis not present

## 2015-07-15 ENCOUNTER — Encounter: Payer: Self-pay | Admitting: *Deleted

## 2015-07-15 DIAGNOSIS — N529 Male erectile dysfunction, unspecified: Secondary | ICD-10-CM | POA: Diagnosis not present

## 2015-07-15 DIAGNOSIS — E668 Other obesity: Secondary | ICD-10-CM | POA: Diagnosis not present

## 2015-07-15 DIAGNOSIS — E784 Other hyperlipidemia: Secondary | ICD-10-CM | POA: Diagnosis not present

## 2015-07-15 DIAGNOSIS — J302 Other seasonal allergic rhinitis: Secondary | ICD-10-CM | POA: Diagnosis not present

## 2015-07-15 DIAGNOSIS — M109 Gout, unspecified: Secondary | ICD-10-CM | POA: Diagnosis not present

## 2015-07-15 DIAGNOSIS — R808 Other proteinuria: Secondary | ICD-10-CM | POA: Diagnosis not present

## 2015-07-15 DIAGNOSIS — D329 Benign neoplasm of meninges, unspecified: Secondary | ICD-10-CM | POA: Diagnosis not present

## 2015-07-15 DIAGNOSIS — M1990 Unspecified osteoarthritis, unspecified site: Secondary | ICD-10-CM | POA: Diagnosis not present

## 2015-07-15 DIAGNOSIS — K219 Gastro-esophageal reflux disease without esophagitis: Secondary | ICD-10-CM | POA: Diagnosis not present

## 2015-07-15 DIAGNOSIS — Z1389 Encounter for screening for other disorder: Secondary | ICD-10-CM | POA: Diagnosis not present

## 2015-07-15 DIAGNOSIS — Z6830 Body mass index (BMI) 30.0-30.9, adult: Secondary | ICD-10-CM | POA: Diagnosis not present

## 2015-07-15 DIAGNOSIS — Z0000 Encounter for general adult medical examination without abnormal findings: Secondary | ICD-10-CM | POA: Diagnosis not present

## 2015-07-16 ENCOUNTER — Other Ambulatory Visit: Payer: Self-pay | Admitting: Oncology

## 2015-07-23 DIAGNOSIS — C61 Malignant neoplasm of prostate: Secondary | ICD-10-CM | POA: Diagnosis not present

## 2015-07-23 DIAGNOSIS — C7951 Secondary malignant neoplasm of bone: Secondary | ICD-10-CM | POA: Diagnosis not present

## 2015-08-15 ENCOUNTER — Other Ambulatory Visit (HOSPITAL_BASED_OUTPATIENT_CLINIC_OR_DEPARTMENT_OTHER): Payer: Medicare Other

## 2015-08-15 ENCOUNTER — Ambulatory Visit (HOSPITAL_BASED_OUTPATIENT_CLINIC_OR_DEPARTMENT_OTHER): Payer: Medicare Other | Admitting: Oncology

## 2015-08-15 ENCOUNTER — Telehealth: Payer: Self-pay | Admitting: Oncology

## 2015-08-15 VITALS — BP 119/71 | HR 84 | Temp 97.7°F | Resp 17 | Ht 71.0 in | Wt 214.0 lb

## 2015-08-15 DIAGNOSIS — E291 Testicular hypofunction: Secondary | ICD-10-CM

## 2015-08-15 DIAGNOSIS — C7951 Secondary malignant neoplasm of bone: Secondary | ICD-10-CM | POA: Diagnosis not present

## 2015-08-15 DIAGNOSIS — C61 Malignant neoplasm of prostate: Secondary | ICD-10-CM | POA: Diagnosis not present

## 2015-08-15 LAB — COMPREHENSIVE METABOLIC PANEL
ALBUMIN: 3.8 g/dL (ref 3.5–5.0)
ALT: 12 U/L (ref 0–55)
ANION GAP: 9 meq/L (ref 3–11)
AST: 17 U/L (ref 5–34)
Alkaline Phosphatase: 57 U/L (ref 40–150)
BUN: 18.5 mg/dL (ref 7.0–26.0)
CALCIUM: 8.5 mg/dL (ref 8.4–10.4)
CO2: 23 meq/L (ref 22–29)
Chloride: 107 mEq/L (ref 98–109)
Creatinine: 1.5 mg/dL — ABNORMAL HIGH (ref 0.7–1.3)
EGFR: 43 mL/min/{1.73_m2} — ABNORMAL LOW (ref >90)
GLUCOSE: 103 mg/dL (ref 70–140)
POTASSIUM: 4.1 meq/L (ref 3.5–5.1)
Sodium: 139 mEq/L (ref 136–145)
Total Bilirubin: 0.52 mg/dL (ref 0.20–1.20)
Total Protein: 6.8 g/dL (ref 6.4–8.3)

## 2015-08-15 LAB — CBC WITH DIFFERENTIAL/PLATELET
BASO%: 0.5 % (ref 0.0–2.0)
BASOS ABS: 0 10*3/uL (ref 0.0–0.1)
EOS ABS: 0.1 10*3/uL (ref 0.0–0.5)
EOS%: 1.8 % (ref 0.0–7.0)
HEMATOCRIT: 38.8 % (ref 38.4–49.9)
HEMOGLOBIN: 13 g/dL (ref 13.0–17.1)
LYMPH#: 1.4 10*3/uL (ref 0.9–3.3)
LYMPH%: 23.4 % (ref 14.0–49.0)
MCH: 31.3 pg (ref 27.2–33.4)
MCHC: 33.5 g/dL (ref 32.0–36.0)
MCV: 93.3 fL (ref 79.3–98.0)
MONO#: 0.3 10*3/uL (ref 0.1–0.9)
MONO%: 4.7 % (ref 0.0–14.0)
NEUT#: 4.3 10*3/uL (ref 1.5–6.5)
NEUT%: 69.6 % (ref 39.0–75.0)
PLATELETS: 140 10*3/uL (ref 140–400)
RBC: 4.16 10*6/uL — ABNORMAL LOW (ref 4.20–5.82)
RDW: 14.5 % (ref 11.0–14.6)
WBC: 6.2 10*3/uL (ref 4.0–10.3)

## 2015-08-15 NOTE — Progress Notes (Signed)
Hematology and Oncology Follow Up Visit  Madex Batterson [id number] Feb 06, 1932 82 y.o. 08/15/2015 2:54 PM [name], MDPerini, [name], MD   Principle Diagnosis: 82 year old gentleman with castration resistant prostate cancer with metastatic disease to the bone as well as pelvic adenopathy. He was diagnosed in 2007 with a Gleason score 4+3 equals 7 and a PSA of 4.   Prior Therapy:  He received a definitive therapy with radiation therapy and developed biochemical relapse initially treated with androgen deprivation.  He developed bony metastasis including a frontal bone metastasis and was treated with radiation therapy in February of 2013. Most recently his PSA was up to 13.38 despite castration levels of testosterone. His most recent bone scan in June of 2015 showed progression of disease.   Current therapy: Xtandi 160 mg daily started in August of 2015.  Interim History: Mr. Velardo presents today for a followup visit accompanied by his wife. Since his last visit, he continues to be about the same without any major changes. He continues Xtandi without any new complications. He denied any seizure or syncope. He does not report any nausea, abdominal pain or lower extremity edema. He has not reported any skeletal complaints including back pain, shoulder pain or pathological fractures.   He continues to perform activities of daily living without any decline. He denies any falls or syncope. He ambulates without any difficulties. His performance status and quality of life remains excellent and we'll maintained on this current medication.   He does not report any headaches or blurry vision or double vision. Does not report any syncope or seizures. Does not report any chest pain shortness of breath or cough. As that report any hemoptysis or hematemesis. Does not report any leg edema or palpitation. Does not report any frequency urgency or hesitancy. Does not report any skeletal complaints back pain shoulder  pain or hip pain. Does not report any arthralgias or myalgias. He does not report any rashes or lesions or petechiae. Remainder of his review of systems unremarkable   Medications:   Current Outpatient Prescriptions  Medication Sig Dispense Refill  . allopurinol (ZYLOPRIM) 300 MG tablet Take 300 mg by mouth daily.  3  . atorvastatin (LIPITOR) 20 MG tablet Take 20 mg by mouth daily.  4  . diphenhydrAMINE (BENADRYL) 25 MG tablet Take 25 mg by mouth every 6 (six) hours as needed.    Marland Kitchen LORazepam (ATIVAN) 1 MG tablet as directed.  1  . omeprazole (PRILOSEC) 20 MG capsule Take 20 mg by mouth daily.    . promethazine (PHENERGAN) 12.5 MG tablet Take 12.5 mg by mouth every 6 (six) hours as needed. for nausea  1  . simethicone (MYLICON) 80 MG chewable tablet Chew 80 mg by mouth every 6 (six) hours as needed.    . Tamsulosin HCl (FLOMAX) 0.4 MG CAPS Take by mouth.    . Vitamin D, Cholecalciferol, 400 UNITS CHEW Chew 800 Units by mouth daily.    [name] 40 MG capsule TAKE 4 CAPSULES (160MG ) BY MOUTH EVERY DAY 120 capsule 0   No current facility-administered medications for this visit.     Allergies:  No Known Allergies  Past Medical History, Surgical history, Social history, and Family History were reviewed and updated.   Physical Exam: Blood pressure 119/71, pulse 84, temperature 97.7 F (36.5 C), temperature source Oral, resp. rate 17, height 5\' 11"  (1.803 m), weight 214 lb (97.07 kg), SpO2 100 %. ECOG: 1 General appearance: alert and cooperative gentleman without distress. Head: Normocephalic,  without obvious abnormality no oral masses or lesions. Neck: No masses palpated. Lymph nodes: Cervical, supraclavicular, and axillary nodes normal. Heart:regular rate and rhythm, S1, S2 normal, no murmur, click, rub or gallop Lung:chest clear, no wheezing, rales, normal symmetric air entry Abdomen: soft, non-tender, without masses or organomegaly no ascites or shifting dullness. EXT:no erythema,  induration, or nodules Skin:  No rashes or lesions.  Lab Results: Lab Results  Component Value Date   WBC 6.2 08/15/2015   HGB 13.0 08/15/2015   HCT 38.8 08/15/2015   MCV 93.3 08/15/2015   PLT 140 08/15/2015     Chemistry      Component Value Date/Time   NA 140 06/20/2015 1238   NA 135 01/23/2014 1055   K 5.2* 06/20/2015 1238   K 4.7 01/23/2014 1055   CL 102 01/23/2014 1055   CO2 23 06/20/2015 1238   CO2 27 01/23/2014 1055   BUN 15.7 06/20/2015 1238   BUN 24* 01/23/2014 1055   CREATININE 1.5* 06/20/2015 1238   CREATININE 1.60* 01/23/2014 1055      Component Value Date/Time   CALCIUM 8.7 06/20/2015 1238   CALCIUM 9.0 01/23/2014 1055   ALKPHOS 50 06/20/2015 1238   ALKPHOS 53 01/23/2014 1055   AST 26 06/20/2015 1238   AST 18 01/23/2014 1055   ALT 17 06/20/2015 1238   ALT 16 01/23/2014 1055   BILITOT 0.43 06/20/2015 1238   BILITOT 0.4 01/23/2014 1055       Results for AMY, USHER (MRN [id number]) as of 08/15/2015 14:58  Ref. Range 05/10/2015 12:27 06/20/2015 12:38  PSA Latest Ref Range: <=4.00 ng/mL 3.15 3.26      Impression and Plan:  82 year old with:  1. Castration resistant prostate cancer with metastatic disease to the bone. His initial diagnosis was 2007 with a Gleason score 4+3 equal 7 and a PSA of 4.   He is currently on Xtandi and has tolerated it well without any major complications. His PSA Was reviewed for the last 18 months and continues to be relatively stable. His quality of life and cancer status remained stable on this current medication. The plan is to continue the same dose and schedule. The pathology will be used upon symptomatic progression.  2. Androgen depravation: Recommended this to be continued indefinitely. He does not report any delayed complications related to that.  3. Bone directed therapy: He is currently on Xgeva an we recommend for him to continue monthly. He is receiving it at [facility] urology. Continue to educate him about  osteonecrosis of the job and other complications. He continues to take calcium and vitamin D. I see no objections to continuing this at this time.  4. Weight loss: His weight have remained stable at this time.  5. Followup: Will be in 8 weeks.  Zola Button, MD 3/2/20172:54 PM

## 2015-08-15 NOTE — Telephone Encounter (Signed)
per pof to sch pt appt-gave pt copy of avs °

## 2015-08-16 ENCOUNTER — Telehealth: Payer: Self-pay | Admitting: *Deleted

## 2015-08-16 DIAGNOSIS — C61 Malignant neoplasm of prostate: Secondary | ICD-10-CM | POA: Diagnosis not present

## 2015-08-16 DIAGNOSIS — C7951 Secondary malignant neoplasm of bone: Secondary | ICD-10-CM | POA: Diagnosis not present

## 2015-08-16 DIAGNOSIS — E291 Testicular hypofunction: Secondary | ICD-10-CM | POA: Diagnosis not present

## 2015-08-16 LAB — PSA: Prostate Specific Ag, Serum: 3.5 ng/mL (ref 0.0–4.0)

## 2015-08-16 LAB — PSA (PARALLEL TESTING): PSA: 3.46 ng/mL (ref <=4.00)

## 2015-08-16 NOTE — Telephone Encounter (Signed)
-----   Message from Wyatt Portela, MD sent at 08/16/2015 10:03 AM EST ----- Please call him with his PSA. Up slightly but still under control.

## 2015-08-16 NOTE — Telephone Encounter (Signed)
Left message on patient's answering machine to call me. ( re: last PSA result )

## 2015-08-19 ENCOUNTER — Telehealth: Payer: Self-pay | Admitting: *Deleted

## 2015-08-19 NOTE — Telephone Encounter (Signed)
Spoke with wife Pamala Hurry, gave results of PSA

## 2015-08-27 DIAGNOSIS — C61 Malignant neoplasm of prostate: Secondary | ICD-10-CM | POA: Diagnosis not present

## 2015-08-27 DIAGNOSIS — C7951 Secondary malignant neoplasm of bone: Secondary | ICD-10-CM | POA: Diagnosis not present

## 2015-09-10 ENCOUNTER — Other Ambulatory Visit: Payer: Self-pay | Admitting: Oncology

## 2015-10-01 DIAGNOSIS — C7951 Secondary malignant neoplasm of bone: Secondary | ICD-10-CM | POA: Diagnosis not present

## 2015-10-01 DIAGNOSIS — C61 Malignant neoplasm of prostate: Secondary | ICD-10-CM | POA: Diagnosis not present

## 2015-10-11 ENCOUNTER — Telehealth: Payer: Self-pay | Admitting: Oncology

## 2015-10-11 NOTE — Telephone Encounter (Signed)
s.w. pt wife and advised that 5.2 appt cx andmoved to 5.3 due to md on call....pt ok and aware of new d.t

## 2015-10-15 ENCOUNTER — Ambulatory Visit: Payer: Medicare Other | Admitting: Oncology

## 2015-10-15 ENCOUNTER — Other Ambulatory Visit: Payer: Medicare Other

## 2015-10-16 ENCOUNTER — Other Ambulatory Visit (HOSPITAL_BASED_OUTPATIENT_CLINIC_OR_DEPARTMENT_OTHER): Payer: Medicare Other

## 2015-10-16 ENCOUNTER — Telehealth: Payer: Self-pay | Admitting: Oncology

## 2015-10-16 ENCOUNTER — Ambulatory Visit (HOSPITAL_BASED_OUTPATIENT_CLINIC_OR_DEPARTMENT_OTHER): Payer: Medicare Other | Admitting: Oncology

## 2015-10-16 VITALS — BP 129/69 | HR 68 | Temp 98.0°F | Resp 18 | Ht 71.0 in | Wt 207.9 lb

## 2015-10-16 DIAGNOSIS — C7951 Secondary malignant neoplasm of bone: Secondary | ICD-10-CM | POA: Diagnosis not present

## 2015-10-16 DIAGNOSIS — R634 Abnormal weight loss: Secondary | ICD-10-CM

## 2015-10-16 DIAGNOSIS — C61 Malignant neoplasm of prostate: Secondary | ICD-10-CM

## 2015-10-16 DIAGNOSIS — E291 Testicular hypofunction: Secondary | ICD-10-CM

## 2015-10-16 LAB — CBC WITH DIFFERENTIAL/PLATELET
BASO%: 0.4 % (ref 0.0–2.0)
BASOS ABS: 0 10*3/uL (ref 0.0–0.1)
EOS ABS: 0.2 10*3/uL (ref 0.0–0.5)
EOS%: 4.7 % (ref 0.0–7.0)
HCT: 39.4 % (ref 38.4–49.9)
HEMOGLOBIN: 13.3 g/dL (ref 13.0–17.1)
LYMPH%: 24.7 % (ref 14.0–49.0)
MCH: 31.4 pg (ref 27.2–33.4)
MCHC: 33.8 g/dL (ref 32.0–36.0)
MCV: 93.1 fL (ref 79.3–98.0)
MONO#: 0.4 10*3/uL (ref 0.1–0.9)
MONO%: 7.6 % (ref 0.0–14.0)
NEUT%: 62.6 % (ref 39.0–75.0)
NEUTROS ABS: 3 10*3/uL (ref 1.5–6.5)
PLATELETS: 137 10*3/uL — AB (ref 140–400)
RBC: 4.23 10*6/uL (ref 4.20–5.82)
RDW: 14.2 % (ref 11.0–14.6)
WBC: 4.7 10*3/uL (ref 4.0–10.3)
lymph#: 1.2 10*3/uL (ref 0.9–3.3)

## 2015-10-16 LAB — COMPREHENSIVE METABOLIC PANEL
ALBUMIN: 3.7 g/dL (ref 3.5–5.0)
ALK PHOS: 58 U/L (ref 40–150)
ALT: 10 U/L (ref 0–55)
ANION GAP: 9 meq/L (ref 3–11)
AST: 14 U/L (ref 5–34)
BILIRUBIN TOTAL: 0.63 mg/dL (ref 0.20–1.20)
BUN: 23 mg/dL (ref 7.0–26.0)
CALCIUM: 9.1 mg/dL (ref 8.4–10.4)
CO2: 25 mEq/L (ref 22–29)
Chloride: 106 mEq/L (ref 98–109)
Creatinine: 1.7 mg/dL — ABNORMAL HIGH (ref 0.7–1.3)
EGFR: 37 mL/min/{1.73_m2} — AB (ref >90)
Glucose: 101 mg/dl (ref 70–140)
POTASSIUM: 4.5 meq/L (ref 3.5–5.1)
Sodium: 139 mEq/L (ref 136–145)
TOTAL PROTEIN: 6.6 g/dL (ref 6.4–8.3)

## 2015-10-16 NOTE — Telephone Encounter (Signed)
GAVE PT APPT & AVS °

## 2015-10-16 NOTE — Progress Notes (Signed)
Hematology and Oncology Follow Up Visit  Eric Deleon [id number] [birth year] 82 y.o. 10/16/2015 9:26 AM Eric Deleon, MDPerini, [name], MD   Principle Diagnosis: 82 year old gentleman with castration resistant prostate cancer with metastatic disease to the bone as well as pelvic adenopathy. He was diagnosed in 2007 with a Gleason score 4+3 equals 7 and a PSA of 4.   Prior Therapy:  He received a definitive therapy with radiation therapy and developed biochemical relapse initially treated with androgen deprivation.  He developed bony metastasis including a frontal bone metastasis and was treated with radiation therapy in February of 2013. Most recently his PSA was up to 13.38 despite castration levels of testosterone. His most recent bone scan in June of 2015 showed progression of disease.   Current therapy: Xtandi 160 mg daily started in August of 2015.  Interim History: Eric Deleon presents today for a followup visit accompanied by his wife. Since his last visit, he reports no recent complaints. He does have chronic lower extremity edema which is improved after the use of compression stockings. He continues Xtandi without any new complications. He does report some mild nausea that is manageable at this time. He denied any seizure or syncope. He does not report any abdominal pain or vomiting. He has not reported any skeletal complaints including back pain, shoulder pain or pathological fractures.   He continues to perform activities of daily living without any decline. He denies any falls or syncope. His quality of life remains excellent. He denied any urination difficulties clotting hematuria or frequency.   He does not report any headaches or blurry vision or double vision. Does not report any chest pain shortness of breath or cough. As that report any hemoptysis or hematemesis. Does not report any leg edema or palpitation. Does not report any frequency urgency or hesitancy. Does not report any  skeletal complaints back pain shoulder pain or hip pain. Does not report any arthralgias or myalgias. He does not report any rashes or lesions or petechiae. Remainder of his review of systems unremarkable   Medications:   Current Outpatient Prescriptions  Medication Sig Dispense Refill  . allopurinol (ZYLOPRIM) 300 MG tablet Take 300 mg by mouth daily.  3  . atorvastatin (LIPITOR) 20 MG tablet Take 20 mg by mouth daily.  4  . diphenhydrAMINE (BENADRYL) 25 MG tablet Take 25 mg by mouth every 6 (six) hours as needed.    Marland Kitchen LORazepam (ATIVAN) 1 MG tablet as directed.  1  . omeprazole (PRILOSEC) 20 MG capsule Take 20 mg by mouth daily.    . promethazine (PHENERGAN) 12.5 MG tablet Take 12.5 mg by mouth every 6 (six) hours as needed. for nausea  1  . simethicone (MYLICON) 80 MG chewable tablet Chew 80 mg by mouth every 6 (six) hours as needed.    . Tamsulosin HCl (FLOMAX) 0.4 MG CAPS Take by mouth.    . Vitamin D, Cholecalciferol, 400 UNITS CHEW Chew 800 Units by mouth daily.    [name] 40 MG capsule TAKE 4 CAPSULES BY MOUTH ONCE DAILY 120 capsule 0   No current facility-administered medications for this visit.     Allergies:  No Known Allergies  Past Medical History, Surgical history, Social history, and Family History were reviewed and updated.   Physical Exam: Blood pressure 129/69, pulse 68, temperature 98 F (36.7 C), temperature source Oral, resp. rate 18, height 5\' 11"  (1.803 m), weight 207 lb 14.4 oz (94.303 kg), SpO2 99 %. ECOG: 1 General appearance: Pleasant-appearing  gentleman without distress. Head: Normocephalic, without obvious abnormality no oral thrush noted Neck: No masses palpated. No thyromegaly. Lymph nodes: Cervical, supraclavicular, and axillary nodes normal. Heart:regular rate and rhythm, S1, S2 normal, no murmur, click, rub or gallop Lung:chest clear, no wheezing, rales, normal symmetric air entry Abdomen: soft, non-tender, without masses or organomegaly no  rebound or guarding. EXT:no erythema, induration, or nodules Skin:  No rashes or lesions.  Lab Results: Lab Results  Component Value Date   WBC 4.7 10/16/2015   HGB 13.3 10/16/2015   HCT 39.4 10/16/2015   MCV 93.1 10/16/2015   PLT 137* 10/16/2015     Chemistry      Component Value Date/Time   NA 139 08/15/2015 1426   NA 135 01/23/2014 1055   K 4.1 08/15/2015 1426   K 4.7 01/23/2014 1055   CL 102 01/23/2014 1055   CO2 23 08/15/2015 1426   CO2 27 01/23/2014 1055   BUN 18.5 08/15/2015 1426   BUN 24* 01/23/2014 1055   CREATININE 1.5* 08/15/2015 1426   CREATININE 1.60* 01/23/2014 1055      Component Value Date/Time   CALCIUM 8.5 08/15/2015 1426   CALCIUM 9.0 01/23/2014 1055   ALKPHOS 57 08/15/2015 1426   ALKPHOS 53 01/23/2014 1055   AST 17 08/15/2015 1426   AST 18 01/23/2014 1055   ALT 12 08/15/2015 1426   ALT 16 01/23/2014 1055   BILITOT 0.52 08/15/2015 1426   BILITOT 0.4 01/23/2014 1055       Results for Eric Deleon ([name]) as of 10/16/2015 09:16  Ref. Range 08/15/2015 14:26 08/15/2015 14:26  PSA Latest Ref Range: 0.0-4.0 ng/mL 3.46 3.5       Impression and Plan:  82 year old with:  1. Castration resistant prostate cancer with metastatic disease to the bone. His initial diagnosis was 2007 with a Gleason score 4+3 equal 7 and a PSA of 4.   He is currently on Xtandi Since 2015 and has tolerated it well without any major complications. His PSA continues to be under excellent control without any symptomatic progression. Plan is to continue the same dose and schedule and use different salvage therapy when he progresses.  2. Androgen depravation: Recommended this to be continued indefinitely. He does not report any delayed complications related to that.  3. Bone directed therapy: He is currently on Xgeva an we recommend for him to continue monthly. He is receiving it at [facility].   4. Weight loss: His weight have dropped close to 10 pounds on the last  2 years. He is eating reasonably well and his weight loss is rather healthy. I have no objections to losing weight given the fact that he was overweight at this time as long as he is maintaining adequate nutrition which she is at this time. We have discussed strategies to continue on this path.  5. Followup: Will be in 8 weeks.  [facility], MD 5/3/20179:26 AM

## 2015-10-17 ENCOUNTER — Telehealth: Payer: Self-pay | Admitting: *Deleted

## 2015-10-17 LAB — PSA: PROSTATE SPECIFIC AG, SERUM: 3.6 ng/mL (ref 0.0–4.0)

## 2015-10-17 NOTE — Telephone Encounter (Signed)
-----   Message from Wyatt Portela, MD sent at 10/17/2015  8:38 AM EDT ----- Please let him know his PSA has not changed much.

## 2015-10-17 NOTE — Telephone Encounter (Signed)
As noted below by Dr. Alen Blew, I tried calling patient to inform him that his PSA level was 3.6 but was unable to leave a message.

## 2015-10-30 DIAGNOSIS — C7951 Secondary malignant neoplasm of bone: Secondary | ICD-10-CM | POA: Diagnosis not present

## 2015-10-30 DIAGNOSIS — C61 Malignant neoplasm of prostate: Secondary | ICD-10-CM | POA: Diagnosis not present

## 2015-11-18 ENCOUNTER — Other Ambulatory Visit: Payer: Self-pay | Admitting: Oncology

## 2015-12-03 DIAGNOSIS — C61 Malignant neoplasm of prostate: Secondary | ICD-10-CM | POA: Diagnosis not present

## 2015-12-03 DIAGNOSIS — C7951 Secondary malignant neoplasm of bone: Secondary | ICD-10-CM | POA: Diagnosis not present

## 2015-12-10 DIAGNOSIS — D485 Neoplasm of uncertain behavior of skin: Secondary | ICD-10-CM | POA: Diagnosis not present

## 2015-12-10 DIAGNOSIS — C44629 Squamous cell carcinoma of skin of left upper limb, including shoulder: Secondary | ICD-10-CM | POA: Diagnosis not present

## 2015-12-10 DIAGNOSIS — L821 Other seborrheic keratosis: Secondary | ICD-10-CM | POA: Diagnosis not present

## 2015-12-11 ENCOUNTER — Ambulatory Visit (HOSPITAL_BASED_OUTPATIENT_CLINIC_OR_DEPARTMENT_OTHER): Payer: Medicare Other | Admitting: Oncology

## 2015-12-11 ENCOUNTER — Other Ambulatory Visit (HOSPITAL_BASED_OUTPATIENT_CLINIC_OR_DEPARTMENT_OTHER): Payer: Medicare Other

## 2015-12-11 ENCOUNTER — Telehealth: Payer: Self-pay | Admitting: Oncology

## 2015-12-11 VITALS — BP 124/65 | HR 65 | Temp 97.6°F | Resp 18 | Ht 71.0 in | Wt 204.8 lb

## 2015-12-11 DIAGNOSIS — C61 Malignant neoplasm of prostate: Secondary | ICD-10-CM

## 2015-12-11 DIAGNOSIS — E291 Testicular hypofunction: Secondary | ICD-10-CM

## 2015-12-11 DIAGNOSIS — R634 Abnormal weight loss: Secondary | ICD-10-CM

## 2015-12-11 DIAGNOSIS — R599 Enlarged lymph nodes, unspecified: Secondary | ICD-10-CM

## 2015-12-11 LAB — CBC WITH DIFFERENTIAL/PLATELET
BASO%: 0.4 % (ref 0.0–2.0)
BASOS ABS: 0 10*3/uL (ref 0.0–0.1)
EOS%: 1.7 % (ref 0.0–7.0)
Eosinophils Absolute: 0.1 10*3/uL (ref 0.0–0.5)
HEMATOCRIT: 41 % (ref 38.4–49.9)
HEMOGLOBIN: 13.5 g/dL (ref 13.0–17.1)
LYMPH%: 15.1 % (ref 14.0–49.0)
MCH: 30.7 pg (ref 27.2–33.4)
MCHC: 32.9 g/dL (ref 32.0–36.0)
MCV: 93.2 fL (ref 79.3–98.0)
MONO#: 0.5 10*3/uL (ref 0.1–0.9)
MONO%: 6.4 % (ref 0.0–14.0)
NEUT%: 76.4 % — ABNORMAL HIGH (ref 39.0–75.0)
NEUTROS ABS: 6.5 10*3/uL (ref 1.5–6.5)
Platelets: 162 10*3/uL (ref 140–400)
RBC: 4.4 10*6/uL (ref 4.20–5.82)
RDW: 14.5 % (ref 11.0–14.6)
WBC: 8.5 10*3/uL (ref 4.0–10.3)
lymph#: 1.3 10*3/uL (ref 0.9–3.3)

## 2015-12-11 LAB — COMPREHENSIVE METABOLIC PANEL
ALBUMIN: 3.7 g/dL (ref 3.5–5.0)
ALK PHOS: 79 U/L (ref 40–150)
ALT: 11 U/L (ref 0–55)
AST: 17 U/L (ref 5–34)
Anion Gap: 10 mEq/L (ref 3–11)
BILIRUBIN TOTAL: 0.58 mg/dL (ref 0.20–1.20)
BUN: 24 mg/dL (ref 7.0–26.0)
CALCIUM: 9.4 mg/dL (ref 8.4–10.4)
CO2: 25 mEq/L (ref 22–29)
CREATININE: 1.6 mg/dL — AB (ref 0.7–1.3)
Chloride: 102 mEq/L (ref 98–109)
EGFR: 40 mL/min/{1.73_m2} — ABNORMAL LOW (ref >90)
GLUCOSE: 107 mg/dL (ref 70–140)
POTASSIUM: 5 meq/L (ref 3.5–5.1)
Sodium: 137 mEq/L (ref 136–145)
TOTAL PROTEIN: 7.2 g/dL (ref 6.4–8.3)

## 2015-12-11 NOTE — Progress Notes (Signed)
Hematology and Oncology Follow Up Visit  Eric Deleon [id number] 04-19-1932 83 y.o. 12/11/2015 10:09 AM Eric Deleon, MDPerini, [name], MD   Principle Diagnosis: 82 year old gentleman with castration resistant prostate cancer with metastatic disease to the bone as well as pelvic adenopathy. He was diagnosed in 2007 with a Gleason score 4+3 equals 7 and a PSA of 4.   Prior Therapy:  He received a definitive therapy with radiation therapy and developed biochemical relapse initially treated with androgen deprivation.  He developed bony metastasis including a frontal bone metastasis and was treated with radiation therapy in February of 2013. Most recently his PSA was up to 13.38 despite castration levels of testosterone. His most recent bone scan in June of 2015 showed progression of disease.   Current therapy: Xtandi 160 mg daily started in August of 2015.  Interim History: [name] presents today for a followup visit accompanied by his wife. Since his last visit, he reports no major changes in his health. He continues Xtandi without any new complications. He does report some mild nausea that is manageable at this time. He denied any seizure or syncope. He does not report any abdominal pain or vomiting. He has not reported any skeletal complaints including back pain, shoulder pain or pathological fractures. He did report cervical adenopathy after a recent sinus infection and flulike symptoms and currently resolving. He denied any fevers or chills or sweats. No constitutional symptoms otherwise.    Performance status and quality of life remains maintained without any further decline. He continues to attend activities of daily living.  He does not report any headaches or blurry vision or double vision. Does not report any chest pain shortness of breath or cough. As that report any hemoptysis or hematemesis. Does not report any leg edema or palpitation. Does not report any frequency urgency or  hesitancy. Does not report any skeletal complaints back pain shoulder pain or hip pain. Does not report any arthralgias or myalgias. He does not report any rashes or lesions or petechiae. Remainder of his review of systems unremarkable   Medications:   Current Outpatient Prescriptions  Medication Sig Dispense Refill  . allopurinol (ZYLOPRIM) 300 MG tablet Take 300 mg by mouth daily.  3  . atorvastatin (LIPITOR) 20 MG tablet Take 20 mg by mouth daily.  4  . diphenhydrAMINE (BENADRYL) 25 MG tablet Take 25 mg by mouth every 6 (six) hours as needed.    Marland Kitchen LORazepam (ATIVAN) 1 MG tablet as directed.  1  . omeprazole (PRILOSEC) 20 MG capsule Take 20 mg by mouth daily.    . promethazine (PHENERGAN) 12.5 MG tablet Take 12.5 mg by mouth every 6 (six) hours as needed. for nausea  1  . simethicone (MYLICON) 80 MG chewable tablet Chew 80 mg by mouth every 6 (six) hours as needed.    . Tamsulosin HCl (FLOMAX) 0.4 MG CAPS Take by mouth.    . Vitamin D, Cholecalciferol, 400 UNITS CHEW Chew 800 Units by mouth daily.    [name] 40 MG capsule TAKE 4 CAPSULES BY MOUTH ONCE DAILY 120 capsule 0   No current facility-administered medications for this visit.     Allergies:  No Known Allergies  Past Medical History, Surgical history, Social history, and Family History were reviewed and updated.   Physical Exam: Blood pressure 124/65, pulse 65, temperature 97.6 F (36.4 C), temperature source Oral, resp. rate 18, height 5\' 11"  (1.803 m), weight 204 lb 12.8 oz (92.897 kg), SpO2 99 %. ECOG: 1  General appearance: Alert, awake gentleman without distress. Head: Normocephalic, without obvious abnormality no oral thrush noted. Neck: No masses palpated. No thyromegaly. Lymph nodes: Slightly enlarged cervical lymph nodes on the right. No lymphadenopathy noted in the axilla or inguinal area bilaterally. Heart:regular rate and rhythm, S1, S2 normal, no murmur, click, rub or gallop Lung:chest clear, no wheezing,  rales, normal symmetric air entry Abdomen: soft, non-tender, without masses or organomegaly no rebound or guarding. EXT:no erythema, induration, or nodules Skin:  No rashes or lesions.  Lab Results: Lab Results  Component Value Date   WBC 8.5 12/11/2015   HGB 13.5 12/11/2015   HCT 41.0 12/11/2015   MCV 93.2 12/11/2015   PLT 162 12/11/2015     Chemistry      Component Value Date/Time   NA 139 10/16/2015 0900   NA 135 01/23/2014 1055   K 4.5 10/16/2015 0900   K 4.7 01/23/2014 1055   CL 102 01/23/2014 1055   CO2 25 10/16/2015 0900   CO2 27 01/23/2014 1055   BUN 23.0 10/16/2015 0900   BUN 24* 01/23/2014 1055   CREATININE 1.7* 10/16/2015 0900   CREATININE 1.60* 01/23/2014 1055      Component Value Date/Time   CALCIUM 9.1 10/16/2015 0900   CALCIUM 9.0 01/23/2014 1055   ALKPHOS 58 10/16/2015 0900   ALKPHOS 53 01/23/2014 1055   AST 14 10/16/2015 0900   AST 18 01/23/2014 1055   ALT 10 10/16/2015 0900   ALT 16 01/23/2014 1055   BILITOT 0.63 10/16/2015 0900   BILITOT 0.4 01/23/2014 1055        Results for Eric Deleon, Eric Deleon ([name]) as of 12/11/2015 09:55  Ref. Range 08/15/2015 14:26 08/15/2015 14:26 10/16/2015 09:00  PSA Latest Ref Range: 0.0-4.0 ng/mL 3.46 3.5 3.6       Impression and Plan:  82 year old with:  1. Castration resistant prostate cancer with metastatic disease to the bone. His initial diagnosis was 2007 with a Gleason score 4+3 equal 7 and a PSA of 4.   He is currently on Xtandi Since 2015 and has tolerated it well without any major complications. His PSA remains stable without any major changes. The plan is continue the same dose and schedule and restaging with a CT scan and a bone scan his PSA start rising rapidly. Different salvage therapy will be utilized at that time.  2. Androgen depravation: Recommended this to be continued indefinitely. He does not report any delayed complications related to that.  3. Bone directed therapy: He is currently on Xgeva  an we recommend for him to continue monthly. He is receiving it at [facility] urology.   4. Weight loss: His weight is down a few pounds since the last visit although some of it is intentional. I doubt this is related to progressive malignancy but we will continue to monitor it.  5. Small cervical adenopathy: Likely reactive in nature after recent infection but lymphoproliferative disorder or malignancy needs to be on the differential as well. If these persist with the next visit, staging CT scan will be done.  6. Followup: Will be in 8 weeks.  [facility], MD 6/28/201710:09 AM

## 2015-12-11 NOTE — Telephone Encounter (Signed)
Gave and printed appt sched and avs for pt for Aug °

## 2015-12-12 LAB — PSA: PROSTATE SPECIFIC AG, SERUM: 5.7 ng/mL — AB (ref 0.0–4.0)

## 2016-01-03 DIAGNOSIS — C61 Malignant neoplasm of prostate: Secondary | ICD-10-CM | POA: Diagnosis not present

## 2016-01-03 DIAGNOSIS — C7951 Secondary malignant neoplasm of bone: Secondary | ICD-10-CM | POA: Diagnosis not present

## 2016-01-13 ENCOUNTER — Other Ambulatory Visit: Payer: Self-pay | Admitting: Oncology

## 2016-02-04 DIAGNOSIS — C7951 Secondary malignant neoplasm of bone: Secondary | ICD-10-CM | POA: Diagnosis not present

## 2016-02-04 DIAGNOSIS — C61 Malignant neoplasm of prostate: Secondary | ICD-10-CM | POA: Diagnosis not present

## 2016-02-05 ENCOUNTER — Ambulatory Visit (HOSPITAL_BASED_OUTPATIENT_CLINIC_OR_DEPARTMENT_OTHER): Payer: Medicare Other | Admitting: Oncology

## 2016-02-05 ENCOUNTER — Other Ambulatory Visit (HOSPITAL_BASED_OUTPATIENT_CLINIC_OR_DEPARTMENT_OTHER): Payer: Medicare Other

## 2016-02-05 ENCOUNTER — Telehealth: Payer: Self-pay | Admitting: Oncology

## 2016-02-05 DIAGNOSIS — E291 Testicular hypofunction: Secondary | ICD-10-CM | POA: Diagnosis not present

## 2016-02-05 DIAGNOSIS — C7951 Secondary malignant neoplasm of bone: Secondary | ICD-10-CM | POA: Diagnosis not present

## 2016-02-05 DIAGNOSIS — R599 Enlarged lymph nodes, unspecified: Secondary | ICD-10-CM | POA: Diagnosis not present

## 2016-02-05 DIAGNOSIS — C61 Malignant neoplasm of prostate: Secondary | ICD-10-CM | POA: Diagnosis not present

## 2016-02-05 LAB — CBC WITH DIFFERENTIAL/PLATELET
BASO%: 0.7 % (ref 0.0–2.0)
Basophils Absolute: 0 10e3/uL (ref 0.0–0.1)
EOS%: 1.9 % (ref 0.0–7.0)
Eosinophils Absolute: 0.1 10e3/uL (ref 0.0–0.5)
HCT: 38.2 % — ABNORMAL LOW (ref 38.4–49.9)
HGB: 12.2 g/dL — ABNORMAL LOW (ref 13.0–17.1)
LYMPH%: 16.3 % (ref 14.0–49.0)
MCH: 29.6 pg (ref 27.2–33.4)
MCHC: 32 g/dL (ref 32.0–36.0)
MCV: 92.8 fL (ref 79.3–98.0)
MONO#: 0.5 10e3/uL (ref 0.1–0.9)
MONO%: 6.5 % (ref 0.0–14.0)
NEUT#: 5.5 10e3/uL (ref 1.5–6.5)
NEUT%: 74.6 % (ref 39.0–75.0)
Platelets: 178 10e3/uL (ref 140–400)
RBC: 4.12 10e6/uL — ABNORMAL LOW (ref 4.20–5.82)
RDW: 15.1 % — ABNORMAL HIGH (ref 11.0–14.6)
WBC: 7.3 10e3/uL (ref 4.0–10.3)
lymph#: 1.2 10e3/uL (ref 0.9–3.3)

## 2016-02-05 LAB — COMPREHENSIVE METABOLIC PANEL WITH GFR
ALT: <9 U/L (ref 0–55)
AST: 14 U/L (ref 5–34)
Albumin: 3.3 g/dL — ABNORMAL LOW (ref 3.5–5.0)
Alkaline Phosphatase: 72 U/L (ref 40–150)
Anion Gap: 8 meq/L (ref 3–11)
BUN: 19.5 mg/dL (ref 7.0–26.0)
CO2: 26 meq/L (ref 22–29)
Calcium: 9.3 mg/dL (ref 8.4–10.4)
Chloride: 106 meq/L (ref 98–109)
Creatinine: 1.4 mg/dL — ABNORMAL HIGH (ref 0.7–1.3)
EGFR: 45 ml/min/1.73 m2 — ABNORMAL LOW (ref >90)
Glucose: 98 mg/dL (ref 70–140)
Potassium: 4.4 meq/L (ref 3.5–5.1)
Sodium: 141 meq/L (ref 136–145)
Total Bilirubin: 0.39 mg/dL (ref 0.20–1.20)
Total Protein: 6.6 g/dL (ref 6.4–8.3)

## 2016-02-05 NOTE — Progress Notes (Signed)
Hematology and Oncology Follow Up Visit  Eric Deleon [id number] 1931-10-29 82 y.o. 02/05/2016 12:12 PM Eric Deleon, MDPerini, [name], MD   Principle Diagnosis: 82 year old man with castration-resistant prostate cancer with metastatic disease to the bone as well as pelvic adenopathy. He was diagnosed in 2007 with a Gleason score 4+3 equals 7 and a PSA of 4.   Prior Therapy:  He received a definitive therapy with radiation therapy and developed biochemical relapse initially treated with androgen deprivation.  He developed bony metastasis including a frontal bone metastasis and was treated with radiation therapy in February of 2013. PSA was up to 13.38 despite castration levels of testosterone. His most recent bone scan in June of 2015 showed progression of disease.   Current therapy: Xtandi 160 mg daily started in August of 2015.  Interim History: Eric Deleon presents today for a followup visit accompanied by his wife. Since his last visit, he continues to do reasonably well. He denied any recent illnesses or hospitalizations. He denied any bone pain or pathological fractures. His performance status and activity level remains unchanged.  He continues Xtandi without any new complications. He does report some mild nausea that is manageable at this time. He denied any seizure or syncope. He does not report any abdominal pain or vomiting. He does report decrease in his appetite and gradual weight loss. His weight loss was initially intentional but now feels that he doesn't have the same appetite he had before. He denied any dysphagia without aphasia.  He does not report any headaches or blurry vision or double vision. Does not report any chest pain shortness of breath or cough. As that report any hemoptysis or hematemesis. Does not report any leg edema or palpitation. Does not report any frequency urgency or hesitancy. Does not report any skeletal complaints back pain shoulder pain or hip pain. Does not  report any arthralgias or myalgias. He does not report any rashes or lesions or petechiae. Remainder of his review of systems unremarkable   Medications:   Current Outpatient Prescriptions  Medication Sig Dispense Refill  . allopurinol (ZYLOPRIM) 300 MG tablet Take 300 mg by mouth daily.  3  . atorvastatin (LIPITOR) 20 MG tablet Take 20 mg by mouth daily.  4  . diphenhydrAMINE (BENADRYL) 25 MG tablet Take 25 mg by mouth every 6 (six) hours as needed.    Marland Kitchen LORazepam (ATIVAN) 1 MG tablet as directed.  1  . omeprazole (PRILOSEC) 20 MG capsule Take 20 mg by mouth daily.    . promethazine (PHENERGAN) 12.5 MG tablet Take 12.5 mg by mouth every 6 (six) hours as needed. for nausea  1  . simethicone (MYLICON) 80 MG chewable tablet Chew 80 mg by mouth every 6 (six) hours as needed.    . Tamsulosin HCl (FLOMAX) 0.4 MG CAPS Take by mouth.    . Vitamin D, Cholecalciferol, 400 UNITS CHEW Chew 800 Units by mouth daily.    [name] 40 MG capsule TAKE 4 CAPSULES BY MOUTH ONCE DAILY 120 capsule 0   No current facility-administered medications for this visit.      Allergies:  No Known Allergies  Past Medical History, Surgical history, Social history, and Family History were reviewed and updated.   Physical Exam: There were no vitals taken for this visit. ECOG: 1 General appearance: Well-appearing gentleman appeared without distress. Head: Normocephalic, without obvious abnormality no oral ulcers or lesions. Neck: No masses palpated. No thyromegaly. Lymph nodes: No lymphadenopathy noted. Heart:regular rate and rhythm, S1, S2  normal, no murmur, click, rub or gallop Lung:chest clear, no wheezing, rales, normal symmetric air entry Abdomen: soft, non-tender, without masses or organomegaly no shifting dullness or ascites. EXT:no erythema, induration, or nodules Skin:  No rashes or lesions.  Lab Results: Lab Results  Component Value Date   WBC 7.3 02/05/2016   HGB 12.2 (L) 02/05/2016   HCT 38.2  (L) 02/05/2016   MCV 92.8 02/05/2016   PLT 178 02/05/2016     Chemistry      Component Value Date/Time   NA 137 12/11/2015 0938   K 5.0 12/11/2015 0938   CL 102 01/23/2014 1055   CO2 25 12/11/2015 0938   BUN 24.0 12/11/2015 0938   CREATININE 1.6 (H) 12/11/2015 0938      Component Value Date/Time   CALCIUM 9.4 12/11/2015 0938   ALKPHOS 79 12/11/2015 0938   AST 17 12/11/2015 0938   ALT 11 12/11/2015 0938   BILITOT 0.58 12/11/2015 0938       Results for Eric Deleon, Eric Deleon (MRN [id number]) as of 02/05/2016 11:58  Ref. Range 08/15/2015 14:26 10/16/2015 09:00 12/11/2015 09:38  PSA Latest Ref Range: 0.0 - 4.0 ng/mL 3.5 3.6 5.7 (H)        Impression and Plan:  82 year old with:  1. Castration-resistant prostate cancer with metastatic disease to the bone. His initial diagnosis was 2007 with a Gleason score 4+3 equal 7 and a PSA of 4.   He is currently on Xtandi Since 2015 and has tolerated it well without any major complications. His PSA showed a slight increase up to 5.7.  The natural course of this disease was reviewed again with the patient and his wife. His fluctuation in his PSA could indicate possible resistant Xtandi after 2 years of therapy. The plan is to continue with the same dose and schedule and if his PSA continues to rise, we will restage him with a CT scan and a bone scan and different salvage therapy will be utilized. That salvage therapy could be Zytiga or systemic chemotherapy.  2. Androgen depravation: He continues to receive androgen deprivation therapy every 3 months at [facility] urology.  3. Bone directed therapy: He is currently on Xgeva an we recommend for him to continue monthly. He is receiving it at [facility] urology.   4. Weight loss: This was addressed with him again and different strategies to maintain his weight and adequate nutrition were reviewed. I continue to recommend clinical supplements to supplement his diet.  5. Small cervical adenopathy: Likely  reactive in nature after recent infection but lymphoproliferative disorder. Appears to have resolved at this time.  6. Followup: Will be in 8 weeks.  [facility], MD 8/23/201712:12 PM

## 2016-02-05 NOTE — Telephone Encounter (Signed)
AVS REPORT AND APPT SCHD GIVEN PER 02/05/16 LOS.

## 2016-02-06 LAB — PSA: Prostate Specific Ag, Serum: 4.9 ng/mL — ABNORMAL HIGH (ref 0.0–4.0)

## 2016-02-19 DIAGNOSIS — C775 Secondary and unspecified malignant neoplasm of intrapelvic lymph nodes: Secondary | ICD-10-CM | POA: Diagnosis not present

## 2016-02-19 DIAGNOSIS — C61 Malignant neoplasm of prostate: Secondary | ICD-10-CM | POA: Diagnosis not present

## 2016-02-19 DIAGNOSIS — C7951 Secondary malignant neoplasm of bone: Secondary | ICD-10-CM | POA: Diagnosis not present

## 2016-02-19 DIAGNOSIS — C7919 Secondary malignant neoplasm of other urinary organs: Secondary | ICD-10-CM | POA: Diagnosis not present

## 2016-03-10 DIAGNOSIS — C7951 Secondary malignant neoplasm of bone: Secondary | ICD-10-CM | POA: Diagnosis not present

## 2016-03-10 DIAGNOSIS — C61 Malignant neoplasm of prostate: Secondary | ICD-10-CM | POA: Diagnosis not present

## 2016-03-12 DIAGNOSIS — H35363 Drusen (degenerative) of macula, bilateral: Secondary | ICD-10-CM | POA: Diagnosis not present

## 2016-03-12 DIAGNOSIS — H2513 Age-related nuclear cataract, bilateral: Secondary | ICD-10-CM | POA: Diagnosis not present

## 2016-03-14 DIAGNOSIS — Z23 Encounter for immunization: Secondary | ICD-10-CM | POA: Diagnosis not present

## 2016-03-16 ENCOUNTER — Other Ambulatory Visit: Payer: Self-pay | Admitting: Oncology

## 2016-03-19 ENCOUNTER — Other Ambulatory Visit: Payer: Self-pay | Admitting: *Deleted

## 2016-03-19 MED ORDER — ENZALUTAMIDE 40 MG PO CAPS: ORAL_CAPSULE | ORAL | 0 refills | Status: DC

## 2016-04-08 ENCOUNTER — Telehealth: Payer: Self-pay | Admitting: Oncology

## 2016-04-08 ENCOUNTER — Ambulatory Visit (HOSPITAL_BASED_OUTPATIENT_CLINIC_OR_DEPARTMENT_OTHER): Payer: Medicare Other | Admitting: Oncology

## 2016-04-08 ENCOUNTER — Other Ambulatory Visit (HOSPITAL_BASED_OUTPATIENT_CLINIC_OR_DEPARTMENT_OTHER): Payer: Medicare Other

## 2016-04-08 VITALS — BP 132/60 | HR 62 | Temp 97.9°F | Resp 18 | Ht 71.0 in | Wt 203.7 lb

## 2016-04-08 DIAGNOSIS — E291 Testicular hypofunction: Secondary | ICD-10-CM | POA: Diagnosis not present

## 2016-04-08 DIAGNOSIS — C61 Malignant neoplasm of prostate: Secondary | ICD-10-CM

## 2016-04-08 DIAGNOSIS — C7951 Secondary malignant neoplasm of bone: Secondary | ICD-10-CM | POA: Diagnosis not present

## 2016-04-08 DIAGNOSIS — R599 Enlarged lymph nodes, unspecified: Secondary | ICD-10-CM | POA: Diagnosis not present

## 2016-04-08 LAB — CBC WITH DIFFERENTIAL/PLATELET
BASO%: 0.8 % (ref 0.0–2.0)
BASOS ABS: 0 10*3/uL (ref 0.0–0.1)
EOS%: 3 % (ref 0.0–7.0)
Eosinophils Absolute: 0.2 10*3/uL (ref 0.0–0.5)
HEMATOCRIT: 37.4 % — AB (ref 38.4–49.9)
HGB: 12.1 g/dL — ABNORMAL LOW (ref 13.0–17.1)
LYMPH#: 1.2 10*3/uL (ref 0.9–3.3)
LYMPH%: 19.8 % (ref 14.0–49.0)
MCH: 29.7 pg (ref 27.2–33.4)
MCHC: 32.4 g/dL (ref 32.0–36.0)
MCV: 91.6 fL (ref 79.3–98.0)
MONO#: 0.4 10*3/uL (ref 0.1–0.9)
MONO%: 6 % (ref 0.0–14.0)
NEUT#: 4.3 10*3/uL (ref 1.5–6.5)
NEUT%: 70.4 % (ref 39.0–75.0)
Platelets: 186 10*3/uL (ref 140–400)
RBC: 4.09 10*6/uL — AB (ref 4.20–5.82)
RDW: 14.9 % — ABNORMAL HIGH (ref 11.0–14.6)
WBC: 6.2 10*3/uL (ref 4.0–10.3)

## 2016-04-08 LAB — COMPREHENSIVE METABOLIC PANEL
ALT: 8 U/L (ref 0–55)
ANION GAP: 8 meq/L (ref 3–11)
AST: 13 U/L (ref 5–34)
Albumin: 3.3 g/dL — ABNORMAL LOW (ref 3.5–5.0)
Alkaline Phosphatase: 84 U/L (ref 40–150)
BUN: 27.2 mg/dL — ABNORMAL HIGH (ref 7.0–26.0)
CALCIUM: 9 mg/dL (ref 8.4–10.4)
CHLORIDE: 107 meq/L (ref 98–109)
CO2: 24 meq/L (ref 22–29)
Creatinine: 1.4 mg/dL — ABNORMAL HIGH (ref 0.7–1.3)
EGFR: 46 mL/min/{1.73_m2} — ABNORMAL LOW (ref >90)
Glucose: 96 mg/dl (ref 70–140)
POTASSIUM: 4.6 meq/L (ref 3.5–5.1)
Sodium: 139 mEq/L (ref 136–145)
Total Bilirubin: 0.42 mg/dL (ref 0.20–1.20)
Total Protein: 6.8 g/dL (ref 6.4–8.3)

## 2016-04-08 NOTE — Progress Notes (Signed)
Hematology and Oncology Follow Up Visit  Jeffry Riss [id number] 17-Apr-1932 82 y.o. 04/08/2016 10:38 AM [name], MDPerini, [name], MD   Principle Diagnosis: 82 year old man with castration-resistant prostate cancer with metastatic disease to the bone as well as pelvic adenopathy. He was diagnosed in 2007 with a Gleason score 4+3 equals 7 and a PSA of 4.   Prior Therapy:  He received a definitive therapy with radiation therapy and developed biochemical relapse initially treated with androgen deprivation.  He developed bony metastasis including a frontal bone metastasis and was treated with radiation therapy in February of 2013. PSA was up to 13.38 despite castration levels of testosterone. His most recent bone scan in June of 2015 showed progression of disease.   Current therapy: Xtandi 160 mg daily started in August of 2015.  Interim History: Mr. Kaufer presents today for a followup visit accompanied by his wife. Since his last visit, he reports no major changes in his health. He did have a sinus infection as well as upper respiratory tract infection and if had fullness in his glands. These symptoms are improving at this time and denied any cough, wheezing or fevers. He denied any bone pain or pathological fractures. His performance status and activity level remains unchanged.  He continues Xtandi without any new complications. He denied any seizure or syncope. He does not report any abdominal pain or vomiting. He does report decrease in his appetite. His weight is relatively stable in the last 4 months.  He does not report any headaches or blurry vision or double vision. Does not report any chest pain shortness of breath or cough. As that report any hemoptysis or hematemesis. Does not report any leg edema or palpitation. Does not report any frequency urgency or hesitancy. Does not report any skeletal complaints back pain shoulder pain or hip pain. Does not report any arthralgias or myalgias.  He does not report any rashes or lesions or petechiae. Remainder of his review of systems unremarkable   Medications:   Current Outpatient Prescriptions  Medication Sig Dispense Refill  . allopurinol (ZYLOPRIM) 300 MG tablet Take 300 mg by mouth daily.  3  . atorvastatin (LIPITOR) 20 MG tablet Take 20 mg by mouth daily.  4  . diphenhydrAMINE (BENADRYL) 25 MG tablet Take 25 mg by mouth every 6 (six) hours as needed.    . enzalutamide (XTANDI) 40 MG capsule TAKE 4 CAPSULES BY MOUTH ONCE DAILY 120 capsule 0  . LORazepam (ATIVAN) 1 MG tablet as directed.  1  . omeprazole (PRILOSEC) 20 MG capsule Take 20 mg by mouth daily.    . promethazine (PHENERGAN) 12.5 MG tablet Take 12.5 mg by mouth every 6 (six) hours as needed. for nausea  1  . simethicone (MYLICON) 80 MG chewable tablet Chew 80 mg by mouth every 6 (six) hours as needed.    . Tamsulosin HCl (FLOMAX) 0.4 MG CAPS Take by mouth.    . Vitamin D, Cholecalciferol, 400 UNITS CHEW Chew 800 Units by mouth daily.     No current facility-administered medications for this visit.      Allergies:  No Known Allergies  Past Medical History, Surgical history, Social history, and Family History were reviewed and updated.   Physical Exam: Blood pressure 132/60, pulse 62, temperature 97.9 F (36.6 C), temperature source Oral, resp. rate 18, height 5\' 11"  (1.803 m), weight 203 lb 11.2 oz (92.4 kg), SpO2 99 %. ECOG: 1 General appearance: Alert, awake gentleman without distress. Head: Normocephalic, without obvious abnormality  no oral rash noted. Neck: No masses palpated. No thyromegaly. No lymphadenopathy palpated. Heart:regular rate and rhythm, S1, S2 normal, no murmur, click, rub or gallop Lung:chest clear, no wheezing, rales, normal symmetric air entry Abdomen: soft, non-tender, without masses or organomegaly no rebound or guarding. EXT:no erythema, induration, or nodules Skin:  No rashes or lesions.  Lab Results: Lab Results  Component  Value Date   WBC 6.2 04/08/2016   HGB 12.1 (L) 04/08/2016   HCT 37.4 (L) 04/08/2016   MCV 91.6 04/08/2016   PLT 186 04/08/2016     Chemistry      Component Value Date/Time   NA 141 02/05/2016 1134   K 4.4 02/05/2016 1134   CL 102 01/23/2014 1055   CO2 26 02/05/2016 1134   BUN 19.5 02/05/2016 1134   CREATININE 1.4 (H) 02/05/2016 1134      Component Value Date/Time   CALCIUM 9.3 02/05/2016 1134   ALKPHOS 72 02/05/2016 1134   AST 14 02/05/2016 1134   ALT <9 02/05/2016 1134   BILITOT 0.39 02/05/2016 1134       Results for YEISON, GAGO (MRN [id number]) as of 04/08/2016 10:30  Ref. Range 10/16/2015 09:00 12/11/2015 09:38 02/05/2016 11:34  PSA Latest Ref Range: 0.0 - 4.0 ng/mL 3.6 5.7 (H) 4.9 (H)         Impression and Plan:  82 year old with:  1. Castration-resistant prostate cancer with metastatic disease to the bone. His initial diagnosis was 2007 with a Gleason score 4+3 equal 7 and a PSA of 4.   He is currently on Xtandi Since 2015 and has tolerated it well without any major complications. His PSA showed a slight increase up to 5.7 But currently down to 4.9.  The plan is to continue with Xtandi at the same dose and schedule and consider Zytiga he develops rapid progression in his PSA. His PSA remains relatively low and have been fluctuating.  2. Androgen depravation: He continues to receive androgen deprivation therapy every 3 months at [facility] urology.  3. Bone directed therapy: He is currently on Xgeva an we recommend for him to continue monthly. He is receiving it at [facility] urology.   4. Weight loss: His weight is relatively stable since the last visit.  5. Small cervical adenopathy: Likely reactive in nature after recent infection but lymphoproliferative disorder. No palpated at this time.  6. Followup: Will be in 9 weeks.  [id number], MD 10/25/201710:38 AM

## 2016-04-08 NOTE — Telephone Encounter (Signed)
Gave patient avs report and appointments for January  °

## 2016-04-09 LAB — PSA: PROSTATE SPECIFIC AG, SERUM: 5.9 ng/mL — AB (ref 0.0–4.0)

## 2016-04-10 DIAGNOSIS — Z5111 Encounter for antineoplastic chemotherapy: Secondary | ICD-10-CM | POA: Diagnosis not present

## 2016-04-10 DIAGNOSIS — C61 Malignant neoplasm of prostate: Secondary | ICD-10-CM | POA: Diagnosis not present

## 2016-04-10 DIAGNOSIS — C7951 Secondary malignant neoplasm of bone: Secondary | ICD-10-CM | POA: Diagnosis not present

## 2016-04-13 ENCOUNTER — Other Ambulatory Visit: Payer: Self-pay | Admitting: Oncology

## 2016-04-28 ENCOUNTER — Encounter: Payer: Self-pay | Admitting: Oncology

## 2016-04-28 NOTE — Progress Notes (Signed)
Returned [name]'s call.  Wasn't able to leave a message, the phone just rang.

## 2016-04-30 ENCOUNTER — Encounter: Payer: Self-pay | Admitting: Oncology

## 2016-04-30 NOTE — Progress Notes (Signed)
Mrs. Bostic called back regarding re enrolling pt in the [facility] for 2018.  She will bring completed application on [id number].  Once received I will get dr's signature and fax to [facility] for processing.

## 2016-05-05 ENCOUNTER — Encounter: Payer: Self-pay | Admitting: *Deleted

## 2016-05-21 ENCOUNTER — Other Ambulatory Visit: Payer: Self-pay | Admitting: Oncology

## 2016-05-27 ENCOUNTER — Encounter: Payer: Self-pay | Admitting: Oncology

## 2016-05-27 DIAGNOSIS — C61 Malignant neoplasm of prostate: Secondary | ICD-10-CM | POA: Diagnosis not present

## 2016-05-27 DIAGNOSIS — C7951 Secondary malignant neoplasm of bone: Secondary | ICD-10-CM | POA: Diagnosis not present

## 2016-05-27 NOTE — Progress Notes (Signed)
Pt is approved w/ PAN for Xtandi from 02/25/16 to 05/24/17 for $7,500.

## 2016-05-28 ENCOUNTER — Telehealth: Payer: Self-pay | Admitting: Pharmacist

## 2016-05-28 NOTE — Telephone Encounter (Signed)
Received fax confirmation of [employer] approval as documented by Lenise on 05/27/16.  Copy of approval sent to be scanned into chart.  Raul Del, PharmD, BCPS, BCOP [facility] [phone]

## 2016-06-18 ENCOUNTER — Telehealth: Payer: Self-pay | Admitting: Oncology

## 2016-06-18 ENCOUNTER — Other Ambulatory Visit (HOSPITAL_BASED_OUTPATIENT_CLINIC_OR_DEPARTMENT_OTHER): Payer: Medicare Other

## 2016-06-18 ENCOUNTER — Ambulatory Visit (HOSPITAL_BASED_OUTPATIENT_CLINIC_OR_DEPARTMENT_OTHER): Payer: Medicare Other | Admitting: Oncology

## 2016-06-18 DIAGNOSIS — E291 Testicular hypofunction: Secondary | ICD-10-CM | POA: Diagnosis not present

## 2016-06-18 DIAGNOSIS — C61 Malignant neoplasm of prostate: Secondary | ICD-10-CM | POA: Diagnosis not present

## 2016-06-18 DIAGNOSIS — C7951 Secondary malignant neoplasm of bone: Secondary | ICD-10-CM

## 2016-06-18 DIAGNOSIS — R634 Abnormal weight loss: Secondary | ICD-10-CM

## 2016-06-18 LAB — COMPREHENSIVE METABOLIC PANEL
ALT: 7 U/L (ref 0–55)
AST: 15 U/L (ref 5–34)
Albumin: 3.6 g/dL (ref 3.5–5.0)
Alkaline Phosphatase: 63 U/L (ref 40–150)
Anion Gap: 8 mEq/L (ref 3–11)
BUN: 19.8 mg/dL (ref 7.0–26.0)
CALCIUM: 8.2 mg/dL — AB (ref 8.4–10.4)
CHLORIDE: 109 meq/L (ref 98–109)
CO2: 21 meq/L — AB (ref 22–29)
CREATININE: 1.3 mg/dL (ref 0.7–1.3)
EGFR: 52 mL/min/{1.73_m2} — ABNORMAL LOW (ref >90)
GLUCOSE: 92 mg/dL (ref 70–140)
POTASSIUM: 4.4 meq/L (ref 3.5–5.1)
SODIUM: 138 meq/L (ref 136–145)
Total Bilirubin: 0.51 mg/dL (ref 0.20–1.20)
Total Protein: 6.7 g/dL (ref 6.4–8.3)

## 2016-06-18 LAB — CBC WITH DIFFERENTIAL/PLATELET
BASO%: 0.4 % (ref 0.0–2.0)
Basophils Absolute: 0 10*3/uL (ref 0.0–0.1)
EOS%: 2.8 % (ref 0.0–7.0)
Eosinophils Absolute: 0.2 10*3/uL (ref 0.0–0.5)
HCT: 35.6 % — ABNORMAL LOW (ref 38.4–49.9)
HGB: 11.5 g/dL — ABNORMAL LOW (ref 13.0–17.1)
LYMPH#: 1.2 10*3/uL (ref 0.9–3.3)
LYMPH%: 21.4 % (ref 14.0–49.0)
MCH: 29.4 pg (ref 27.2–33.4)
MCHC: 32.3 g/dL (ref 32.0–36.0)
MCV: 91 fL (ref 79.3–98.0)
MONO#: 0.3 10*3/uL (ref 0.1–0.9)
MONO%: 5 % (ref 0.0–14.0)
NEUT#: 3.8 10*3/uL (ref 1.5–6.5)
NEUT%: 70.4 % (ref 39.0–75.0)
Platelets: 136 10*3/uL — ABNORMAL LOW (ref 140–400)
RBC: 3.91 10*6/uL — ABNORMAL LOW (ref 4.20–5.82)
RDW: 15.8 % — AB (ref 11.0–14.6)
WBC: 5.4 10*3/uL (ref 4.0–10.3)

## 2016-06-18 NOTE — Telephone Encounter (Signed)
Appointments scheduled per 06/18/16 los. Patient was given a copy of the AVS report and appointment schedule per 06/18/16 los. °

## 2016-06-18 NOTE — Progress Notes (Signed)
Hematology and Oncology Follow Up Visit  Eric Deleon [id number] [birth year] 83 y.o. 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 He was diagnosed in 2007 with a Gleason score 4+3 equals 7 and a PSA of 4.   Prior Therapy:  He received a definitive therapy with radiation therapy and developed biochemical relapse initially treated with androgen deprivation.  He developed bony metastasis including a frontal bone metastasis and was treated with radiation therapy in February of 2013. PSA was up to 13.38 despite castration levels of testosterone. His most recent bone scan in June of 2015 showed progression of disease.   Current therapy: Xtandi 160 mg daily started in August of 2015.  Interim History: Eric Deleon presents today for a followup visit. Since his last visit, he reports no recent complaints. He continues to have periodic and rather intermittent left-sided hip pain. This pain is not interfering with his ability to ambulate and had not had any neurological symptoms. For the most part does not take any medication for it. He denied any recent hospitalization or illnesses. His quality of life and performance status has not changed. His appetite is excellent and have gained more weight.  He continues Xtandi without any new complications. He denied any seizure or syncope. He does not report any abdominal pain or vomiting. He denied any recent side effects associated with this medication. Denied any problems obtaining it or taking it.  He does not report any headaches or blurry vision or double vision. Does not report any chest pain shortness of breath or cough. As that report any hemoptysis or hematemesis. Does not report any leg edema or palpitation. Does not report any frequency urgency or hesitancy. Does not report any skeletal complaints back  pain shoulder pain or hip pain. Does not report any arthralgias or myalgias. He does not report any rashes or lesions or petechiae. Remainder of his review of systems unremarkable   Medications:   Current Outpatient Prescriptions  Medication Sig Dispense Refill  . allopurinol (ZYLOPRIM) 300 MG tablet Take 300 mg by mouth daily.  3  . atorvastatin (LIPITOR) 20 MG tablet Take 20 mg by mouth daily.  4  . diphenhydrAMINE (BENADRYL) 25 MG tablet Take 25 mg by mouth every 6 (six) hours as needed.    Marland Kitchen LORazepam (ATIVAN) 1 MG tablet as directed.  1  . omeprazole (PRILOSEC) 20 MG capsule Take 20 mg by mouth daily.    . promethazine (PHENERGAN) 12.5 MG tablet Take 12.5 mg by mouth every 6 (six) hours as needed. for nausea  1  . simethicone (MYLICON) 80 MG chewable tablet Chew 80 mg by mouth every 6 (six) hours as needed.    . Tamsulosin HCl (FLOMAX) 0.4 MG CAPS Take by mouth.    . Vitamin D, Cholecalciferol, 400 UNITS CHEW Chew 800 Units by mouth daily.    [name] 40 MG capsule TAKE 4 CAPSULES (160 MG TOTAL) BY MOUTH ONCE DAILY AT THE SAME TIME. MAY TAKE WITH OR WITHOUT FOOD. SWALLOW WHOLE. 120 capsule 0   No current facility-administered medications for this visit.      Allergies:  No Known Allergies  Past Medical History, Surgical history, Social history, and Family History were reviewed and updated.   Physical Exam: Vital signs reviewed today showed a blood pressure of 148/61. His pulse is 59 with a respiratory rate of 18. His oxygen  saturation was 100% on room air with a temperature of 97.4. His weight is 210.8 pounds. ECOG: 1 General appearance: Well-appearing gentleman without distress. Head: Normocephalic, without obvious abnormality no oral rash noted. Neck: No masses palpated. No thyromegaly. No lymphadenopathy palpated. Heart:regular rate and rhythm, S1, S2 normal, no murmur, click, rub or gallop Lung:chest clear, no wheezing, rales, normal symmetric air entry Abdomen: soft,  non-tender, without masses or organomegaly no shifting dullness or ascites. EXT:no erythema, induration, or nodules Skin:  No rashes or lesions.  Lab Results: Lab Results  Component Value Date   WBC 5.4 06/18/2016   HGB 11.5 (L) 06/18/2016   HCT 35.6 (L) 06/18/2016   MCV 91.0 06/18/2016   PLT 136 (L) 06/18/2016     Chemistry      Component Value Date/Time   NA 139 04/08/2016 1016   K 4.6 04/08/2016 1016   CL 102 01/23/2014 1055   CO2 24 04/08/2016 1016   BUN 27.2 (H) 04/08/2016 1016   CREATININE 1.4 (H) 04/08/2016 1016      Component Value Date/Time   CALCIUM 9.0 04/08/2016 1016   ALKPHOS 84 04/08/2016 1016   AST 13 04/08/2016 1016   ALT 8 04/08/2016 1016   BILITOT 0.42 04/08/2016 1016      Results for Eric Deleon ([name]) as of 06/18/2016 12:06  Ref. Range 12/11/2015 09:38 02/05/2016 11:34 04/08/2016 10:16  PSA Latest Ref Range: 0.0 - 4.0 ng/mL 5.7 (H) 4.9 (H) 5.9 (H)      Impression and Plan:  83 year old with:  1. Castration-resistant prostate cancer with metastatic disease to the bone. His initial diagnosis was 2007 with a Gleason score 4+3 equal 7 and a PSA of 4.   He is currently on Xtandi Since 2015 and has tolerated it well without any major complications.   His most recent PSA have been fluctuating between 5.7 to  4.9 and recently to 5.9. Despite his fluctuations, his disease remains under excellent control on the plan is to continue with the same dose and schedule. Different salvage therapy will be utilized if he develops symptomatic progression.  2. Androgen depravation: He continues to receive androgen deprivation therapy every 3 months at [facility] urology. I recommended continuing this indefinitely.  3. Bone directed therapy: He is currently on Xgeva an we recommend for him to continue monthly. He is receiving it at [facility] urology.   4. Weight loss: His weight continues to increase without any issues.  5. Small cervical adenopathy: Resolved at  this time.  6. Followup: Will be in 8 weeks.  Zola Button, MD 1/4/201812:02 PM

## 2016-06-19 LAB — PSA: Prostate Specific Ag, Serum: 6.2 ng/mL — ABNORMAL HIGH (ref 0.0–4.0)

## 2016-06-30 DIAGNOSIS — C61 Malignant neoplasm of prostate: Secondary | ICD-10-CM | POA: Diagnosis not present

## 2016-06-30 DIAGNOSIS — C7951 Secondary malignant neoplasm of bone: Secondary | ICD-10-CM | POA: Diagnosis not present

## 2016-07-06 ENCOUNTER — Encounter: Payer: Self-pay | Admitting: Oncology

## 2016-07-06 NOTE — Progress Notes (Signed)
Rcvd prior auth approval from [facility] for Eric Deleon 40mg  until Jan. 9, 2019 provided pt stays enrolled in the plan, the prescribing provider continues to prescribe the drug, the drug remains on the formulary, the drug remains on the same formulary tier, there is no change in prior review requirements for the drug, and the drug continues to be safe for treating the member's condition.

## 2016-07-22 ENCOUNTER — Encounter: Payer: Self-pay | Admitting: Oncology

## 2016-07-22 NOTE — Progress Notes (Signed)
Rcvd letter from [employer], pt is approved for their assistance program from 07/09/16 to 06/14/17.

## 2016-07-24 DIAGNOSIS — M109 Gout, unspecified: Secondary | ICD-10-CM | POA: Diagnosis not present

## 2016-07-24 DIAGNOSIS — E784 Other hyperlipidemia: Secondary | ICD-10-CM | POA: Diagnosis not present

## 2016-07-24 DIAGNOSIS — R8299 Other abnormal findings in urine: Secondary | ICD-10-CM | POA: Diagnosis not present

## 2016-07-24 DIAGNOSIS — Z125 Encounter for screening for malignant neoplasm of prostate: Secondary | ICD-10-CM | POA: Diagnosis not present

## 2016-07-24 DIAGNOSIS — N189 Chronic kidney disease, unspecified: Secondary | ICD-10-CM | POA: Diagnosis not present

## 2016-07-24 DIAGNOSIS — R7301 Impaired fasting glucose: Secondary | ICD-10-CM | POA: Diagnosis not present

## 2016-07-29 DIAGNOSIS — C61 Malignant neoplasm of prostate: Secondary | ICD-10-CM | POA: Diagnosis not present

## 2016-07-29 DIAGNOSIS — C7951 Secondary malignant neoplasm of bone: Secondary | ICD-10-CM | POA: Diagnosis not present

## 2016-07-30 DIAGNOSIS — E668 Other obesity: Secondary | ICD-10-CM | POA: Diagnosis not present

## 2016-07-30 DIAGNOSIS — R808 Other proteinuria: Secondary | ICD-10-CM | POA: Diagnosis not present

## 2016-07-30 DIAGNOSIS — C801 Malignant (primary) neoplasm, unspecified: Secondary | ICD-10-CM | POA: Diagnosis not present

## 2016-07-30 DIAGNOSIS — F4320 Adjustment disorder, unspecified: Secondary | ICD-10-CM | POA: Diagnosis not present

## 2016-07-30 DIAGNOSIS — M1990 Unspecified osteoarthritis, unspecified site: Secondary | ICD-10-CM | POA: Diagnosis not present

## 2016-07-30 DIAGNOSIS — N189 Chronic kidney disease, unspecified: Secondary | ICD-10-CM | POA: Diagnosis not present

## 2016-07-30 DIAGNOSIS — E784 Other hyperlipidemia: Secondary | ICD-10-CM | POA: Diagnosis not present

## 2016-07-30 DIAGNOSIS — J302 Other seasonal allergic rhinitis: Secondary | ICD-10-CM | POA: Diagnosis not present

## 2016-07-30 DIAGNOSIS — Z1389 Encounter for screening for other disorder: Secondary | ICD-10-CM | POA: Diagnosis not present

## 2016-07-30 DIAGNOSIS — C61 Malignant neoplasm of prostate: Secondary | ICD-10-CM | POA: Diagnosis not present

## 2016-07-30 DIAGNOSIS — Z0000 Encounter for general adult medical examination without abnormal findings: Secondary | ICD-10-CM | POA: Diagnosis not present

## 2016-07-30 DIAGNOSIS — Z6829 Body mass index (BMI) 29.0-29.9, adult: Secondary | ICD-10-CM | POA: Diagnosis not present

## 2016-08-20 ENCOUNTER — Other Ambulatory Visit (HOSPITAL_BASED_OUTPATIENT_CLINIC_OR_DEPARTMENT_OTHER): Payer: Medicare Other

## 2016-08-20 ENCOUNTER — Ambulatory Visit (HOSPITAL_BASED_OUTPATIENT_CLINIC_OR_DEPARTMENT_OTHER): Payer: Medicare Other | Admitting: Oncology

## 2016-08-20 ENCOUNTER — Telehealth: Payer: Self-pay | Admitting: Oncology

## 2016-08-20 VITALS — BP 137/72 | HR 67 | Temp 98.0°F | Resp 18 | Ht 71.0 in | Wt 207.2 lb

## 2016-08-20 DIAGNOSIS — C7951 Secondary malignant neoplasm of bone: Secondary | ICD-10-CM

## 2016-08-20 DIAGNOSIS — C61 Malignant neoplasm of prostate: Secondary | ICD-10-CM

## 2016-08-20 DIAGNOSIS — E291 Testicular hypofunction: Secondary | ICD-10-CM

## 2016-08-20 LAB — CBC WITH DIFFERENTIAL/PLATELET
BASO%: 0.2 % (ref 0.0–2.0)
BASOS ABS: 0 10*3/uL (ref 0.0–0.1)
EOS ABS: 0.1 10*3/uL (ref 0.0–0.5)
EOS%: 1.4 % (ref 0.0–7.0)
HCT: 37.7 % — ABNORMAL LOW (ref 38.4–49.9)
HGB: 12.8 g/dL — ABNORMAL LOW (ref 13.0–17.1)
LYMPH%: 22.2 % (ref 14.0–49.0)
MCH: 30.8 pg (ref 27.2–33.4)
MCHC: 34 g/dL (ref 32.0–36.0)
MCV: 90.6 fL (ref 79.3–98.0)
MONO#: 0.4 10*3/uL (ref 0.1–0.9)
MONO%: 6.1 % (ref 0.0–14.0)
NEUT%: 70.1 % (ref 39.0–75.0)
NEUTROS ABS: 4.1 10*3/uL (ref 1.5–6.5)
PLATELETS: 138 10*3/uL — AB (ref 140–400)
RBC: 4.16 10*6/uL — ABNORMAL LOW (ref 4.20–5.82)
RDW: 15.1 % — ABNORMAL HIGH (ref 11.0–14.6)
WBC: 5.8 10*3/uL (ref 4.0–10.3)
lymph#: 1.3 10*3/uL (ref 0.9–3.3)
nRBC: 0 % (ref 0–0)

## 2016-08-20 LAB — COMPREHENSIVE METABOLIC PANEL
ALT: 8 U/L (ref 0–55)
AST: 16 U/L (ref 5–34)
Albumin: 3.8 g/dL (ref 3.5–5.0)
Alkaline Phosphatase: 69 U/L (ref 40–150)
Anion Gap: 9 mEq/L (ref 3–11)
BILIRUBIN TOTAL: 0.59 mg/dL (ref 0.20–1.20)
BUN: 23.1 mg/dL (ref 7.0–26.0)
CALCIUM: 9.4 mg/dL (ref 8.4–10.4)
CHLORIDE: 104 meq/L (ref 98–109)
CO2: 24 mEq/L (ref 22–29)
CREATININE: 1.4 mg/dL — AB (ref 0.7–1.3)
EGFR: 45 mL/min/{1.73_m2} — AB (ref >90)
Glucose: 103 mg/dl (ref 70–140)
Potassium: 4.5 mEq/L (ref 3.5–5.1)
Sodium: 137 mEq/L (ref 136–145)
Total Protein: 6.9 g/dL (ref 6.4–8.3)

## 2016-08-20 NOTE — Telephone Encounter (Signed)
Gave Patient AVS and scheduled appts per 08/20/2016 los

## 2016-08-20 NOTE — Progress Notes (Signed)
Hematology and Oncology Follow Up Visit  Eric Deleon [id number] 1932-05-09 83 y.o. 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 He was diagnosed in 2007 with a Gleason score 4+3 equals 7 and a PSA of 4.   Prior Therapy:  He received a definitive therapy with radiation therapy and developed biochemical relapse initially treated with androgen deprivation.  He developed bony metastasis including a frontal bone metastasis and was treated with radiation therapy in February of 2013. PSA was up to 13.38 despite castration levels of testosterone. His most recent bone scan in June of 2015 showed progression of disease.   Current therapy: Xtandi 160 mg daily started in August of 2015.  Interim History: Eric Deleon presents today for a followup visit. Since his last visit, he continues to do very well without major changes in his health. He continues Xtandi without any new complications. He denied any seizure or syncope. He does not report any abdominal pain or vomiting. He denied any recent side effects associated with this medication.    He reports his hip pain previously noted has resolved at this time. He is able to ambulate without any difficulties. He denied any falls or syncope. He denied any recent hospitalization or illnesses. His quality of life and performance status has not changed. His appetite is excellent and have gained more weight since the last visit.   He does not report any headaches or blurry vision or double vision. Does not report any chest pain shortness of breath or cough. As that report any hemoptysis or hematemesis. Does not report any leg edema or palpitation. Does not report any frequency urgency or hesitancy. Does not report any skeletal complaints back pain shoulder pain or hip pain. Does not report any arthralgias or  myalgias. He does not report any rashes or lesions or petechiae. Remainder of his review of systems unremarkable   Medications:   Current Outpatient Prescriptions  Medication Sig Dispense Refill  . allopurinol (ZYLOPRIM) 300 MG tablet Take 300 mg by mouth daily.  3  . atorvastatin (LIPITOR) 20 MG tablet Take 20 mg by mouth daily.  4  . diphenhydrAMINE (BENADRYL) 25 MG tablet Take 25 mg by mouth every 6 (six) hours as needed.    Marland Kitchen LORazepam (ATIVAN) 1 MG tablet as directed.  1  . omeprazole (PRILOSEC) 20 MG capsule Take 20 mg by mouth daily.    . promethazine (PHENERGAN) 12.5 MG tablet Take 12.5 mg by mouth every 6 (six) hours as needed. for nausea  1  . simethicone (MYLICON) 80 MG chewable tablet Chew 80 mg by mouth every 6 (six) hours as needed.    . Tamsulosin HCl (FLOMAX) 0.4 MG CAPS Take by mouth.    . Vitamin D, Cholecalciferol, 400 UNITS CHEW Chew 800 Units by mouth daily.    [name] 40 MG capsule TAKE 4 CAPSULES (160 MG TOTAL) BY MOUTH ONCE DAILY AT THE SAME TIME. MAY TAKE WITH OR WITHOUT FOOD. SWALLOW WHOLE. 120 capsule 0   No current facility-administered medications for this visit.      Allergies:  No Known Allergies  Past Medical History, Surgical history, Social history, and Family History were reviewed and updated.   Physical Exam: Blood pressure 137/72, pulse 67, temperature 98 F (36.7 C), temperature source Oral, resp. rate 18, height 5\' 11"  (1.803 m), weight 207 lb 3.2 oz (94 kg), SpO2  100 %.   ECOG: 1 General appearance: Well-appearing gentleman without distress. Head: Normocephalic, without obvious abnormality no oral rash noted. Neck: No masses palpated. No thyromegaly. No lymphadenopathy palpated. Heart:regular rate and rhythm, S1, S2 normal, no murmur, click, rub or gallop Lung:chest clear, no wheezing, rales, normal symmetric air entry Abdomen: soft, non-tender, without masses or organomegaly no shifting dullness or ascites. EXT:no erythema, induration,  or nodules Skin:  No rashes or lesions.  Lab Results: Lab Results  Component Value Date   WBC 5.8 08/20/2016   HGB 12.8 (L) 08/20/2016   HCT 37.7 (L) 08/20/2016   MCV 90.6 08/20/2016   PLT 138 (L) 08/20/2016     Chemistry      Component Value Date/Time   NA 138 06/18/2016 1136   K 4.4 06/18/2016 1136   CL 102 01/23/2014 1055   CO2 21 (L) 06/18/2016 1136   BUN 19.8 06/18/2016 1136   CREATININE 1.3 06/18/2016 1136      Component Value Date/Time   CALCIUM 8.2 (L) 06/18/2016 1136   ALKPHOS 63 06/18/2016 1136   AST 15 06/18/2016 1136   ALT 7 06/18/2016 1136   BILITOT 0.51 06/18/2016 1136      Results for Eric Deleon (MRN [id number]) as of 08/20/2016 10:26  Ref. Range 02/05/2016 11:34 04/08/2016 10:16 06/18/2016 11:35  PSA Latest Ref Range: 0.0 - 4.0 ng/mL 4.9 (H) 5.9 (H) 6.2 (H)       Impression and Plan:  83 year old with:  1. Castration-resistant prostate cancer with metastatic disease to the bone. His initial diagnosis was 2007 with a Gleason score 4+3 equal 7 and a PSA of 4.   He is currently on Xtandi Since 2015 and has tolerated it well without any major complications.   His PSA continues to be relatively stable with very slow rise over the last 2 years. He is completely asymptomatic with a doubling time of his PSA close to 2 years. Risks and benefits of continuing this medication were reviewed today and is agreeable to continue. His PSA starts to rise rapidly, or he develops symptomatic progression, he will undergo staging workup and different salvage therapy will be utilized.  2. Androgen depravation: He continues to receive androgen deprivation therapy every 3 months at [facility] urology. I recommended continuing this indefinitely.  3. Bone directed therapy: He is currently on Xgeva an we recommend for him to continue monthly. He is receiving it at [facility] urology.   4. Weight loss:  This has resolved at this time and his appetite is excellent.  5. Small cervical  adenopathy: Resolved at this time.  6. Followup: Will be in 3 months sooner if needed to.  [facility], MD 3/8/201810:45 AM

## 2016-08-21 LAB — PSA: Prostate Specific Ag, Serum: 6.8 ng/mL — ABNORMAL HIGH (ref 0.0–4.0)

## 2016-08-26 ENCOUNTER — Other Ambulatory Visit: Payer: Self-pay | Admitting: Urology

## 2016-08-26 DIAGNOSIS — C61 Malignant neoplasm of prostate: Secondary | ICD-10-CM

## 2016-08-28 DIAGNOSIS — Z8546 Personal history of malignant neoplasm of prostate: Secondary | ICD-10-CM | POA: Diagnosis not present

## 2016-08-28 DIAGNOSIS — C61 Malignant neoplasm of prostate: Secondary | ICD-10-CM | POA: Diagnosis not present

## 2016-08-28 DIAGNOSIS — C7919 Secondary malignant neoplasm of other urinary organs: Secondary | ICD-10-CM | POA: Diagnosis not present

## 2016-08-28 DIAGNOSIS — C7951 Secondary malignant neoplasm of bone: Secondary | ICD-10-CM | POA: Diagnosis not present

## 2016-09-07 ENCOUNTER — Other Ambulatory Visit: Payer: Self-pay | Admitting: Oncology

## 2016-09-23 ENCOUNTER — Encounter (HOSPITAL_COMMUNITY)
Admission: RE | Admit: 2016-09-23 | Discharge: 2016-09-23 | Disposition: A | Discharge status: Home / Self Care | Payer: Medicare Other | Source: Ambulatory Visit | Attending: Urology | Admitting: Urology

## 2016-09-23 DIAGNOSIS — C61 Malignant neoplasm of prostate: Secondary | ICD-10-CM | POA: Diagnosis not present

## 2016-09-23 DIAGNOSIS — C763 Malignant neoplasm of pelvis: Secondary | ICD-10-CM | POA: Diagnosis not present

## 2016-09-23 MED ORDER — TECHNETIUM TC 99M MEDRONATE IV KIT
19.9000 | PACK | Freq: Once | INTRAVENOUS | Status: AC | PRN
  Administered 2016-09-23: 19.9 via INTRAVENOUS

## 2016-09-30 DIAGNOSIS — C61 Malignant neoplasm of prostate: Secondary | ICD-10-CM | POA: Diagnosis not present

## 2016-09-30 DIAGNOSIS — C775 Secondary and unspecified malignant neoplasm of intrapelvic lymph nodes: Secondary | ICD-10-CM | POA: Diagnosis not present

## 2016-10-20 ENCOUNTER — Other Ambulatory Visit: Payer: Self-pay | Admitting: *Deleted

## 2016-10-20 ENCOUNTER — Encounter: Payer: Self-pay | Admitting: *Deleted

## 2016-10-20 MED ORDER — XTANDI 40 MG PO CAPS: ORAL_CAPSULE | ORAL | 0 refills | Status: DC

## 2016-11-03 DIAGNOSIS — Z5111 Encounter for antineoplastic chemotherapy: Secondary | ICD-10-CM | POA: Diagnosis not present

## 2016-11-03 DIAGNOSIS — C7951 Secondary malignant neoplasm of bone: Secondary | ICD-10-CM | POA: Diagnosis not present

## 2016-11-03 DIAGNOSIS — C61 Malignant neoplasm of prostate: Secondary | ICD-10-CM | POA: Diagnosis not present

## 2016-11-24 ENCOUNTER — Other Ambulatory Visit: Payer: Self-pay | Admitting: *Deleted

## 2016-11-24 MED ORDER — XTANDI 40 MG PO CAPS: ORAL_CAPSULE | ORAL | 0 refills | Status: DC

## 2016-11-25 ENCOUNTER — Telehealth: Payer: Self-pay | Admitting: Oncology

## 2016-11-25 ENCOUNTER — Other Ambulatory Visit (HOSPITAL_BASED_OUTPATIENT_CLINIC_OR_DEPARTMENT_OTHER): Payer: Medicare Other

## 2016-11-25 ENCOUNTER — Ambulatory Visit (HOSPITAL_BASED_OUTPATIENT_CLINIC_OR_DEPARTMENT_OTHER): Payer: Medicare Other | Admitting: Oncology

## 2016-11-25 VITALS — BP 140/63 | HR 65 | Temp 98.5°F | Resp 18 | Ht 71.0 in | Wt 206.8 lb

## 2016-11-25 DIAGNOSIS — C61 Malignant neoplasm of prostate: Secondary | ICD-10-CM

## 2016-11-25 DIAGNOSIS — C7951 Secondary malignant neoplasm of bone: Secondary | ICD-10-CM

## 2016-11-25 LAB — COMPREHENSIVE METABOLIC PANEL
ALBUMIN: 3.4 g/dL — AB (ref 3.5–5.0)
ALT: 7 U/L (ref 0–55)
ANION GAP: 8 meq/L (ref 3–11)
AST: 12 U/L (ref 5–34)
Alkaline Phosphatase: 85 U/L (ref 40–150)
BUN: 25.6 mg/dL (ref 7.0–26.0)
CO2: 26 mEq/L (ref 22–29)
Calcium: 9.5 mg/dL (ref 8.4–10.4)
Chloride: 106 mEq/L (ref 98–109)
Creatinine: 1.6 mg/dL — ABNORMAL HIGH (ref 0.7–1.3)
EGFR: 38 mL/min/{1.73_m2} — AB (ref >90)
Glucose: 97 mg/dl (ref 70–140)
POTASSIUM: 4.6 meq/L (ref 3.5–5.1)
SODIUM: 140 meq/L (ref 136–145)
TOTAL PROTEIN: 6.9 g/dL (ref 6.4–8.3)
Total Bilirubin: 0.39 mg/dL (ref 0.20–1.20)

## 2016-11-25 LAB — CBC WITH DIFFERENTIAL/PLATELET
BASO%: 0.2 % (ref 0.0–2.0)
BASOS ABS: 0 10*3/uL (ref 0.0–0.1)
EOS ABS: 0.1 10*3/uL (ref 0.0–0.5)
EOS%: 1.2 % (ref 0.0–7.0)
HCT: 36.4 % — ABNORMAL LOW (ref 38.4–49.9)
HEMOGLOBIN: 12 g/dL — AB (ref 13.0–17.1)
LYMPH%: 16.2 % (ref 14.0–49.0)
MCH: 30.8 pg (ref 27.2–33.4)
MCHC: 33 g/dL (ref 32.0–36.0)
MCV: 93.3 fL (ref 79.3–98.0)
MONO#: 0.5 10*3/uL (ref 0.1–0.9)
MONO%: 5.9 % (ref 0.0–14.0)
NEUT%: 76.5 % — ABNORMAL HIGH (ref 39.0–75.0)
NEUTROS ABS: 6.2 10*3/uL (ref 1.5–6.5)
PLATELETS: 169 10*3/uL (ref 140–400)
RBC: 3.9 10*6/uL — AB (ref 4.20–5.82)
RDW: 14.4 % (ref 11.0–14.6)
WBC: 8.2 10*3/uL (ref 4.0–10.3)
lymph#: 1.3 10*3/uL (ref 0.9–3.3)

## 2016-11-25 NOTE — Progress Notes (Signed)
Hematology and Oncology Follow Up Visit  Eric Deleon [id number] Jun 15, 1932 83 y.o. 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 He was diagnosed in 2007 with a Gleason score 4+3 equals 7 and a PSA of 4.   Prior Therapy:  He received a definitive therapy with radiation therapy and developed biochemical relapse initially treated with androgen deprivation.  He developed bony metastasis including a frontal bone metastasis and was treated with radiation therapy in February of 2013. PSA was up to 13.38 despite castration levels of testosterone. His most recent bone scan in June of 2015 showed progression of disease.   Current therapy: Xtandi 160 mg daily started in August of 2015.  Interim History: Eric Deleon presents today for a followup visit. Since his last visit, he reports no recent complaints. He did lose a tooth while he was eating. He denied any pain or recent infections. He denied any decline in his ability to perform activities of daily living. He continues Xtandi without any new complications. He denied any seizure or syncope. He does not report any abdominal pain or vomiting. He denied any recent side effects associated with this medication. He has no difficulties obtaining this medication. He denied any recent bone pain or pathological fractures. His appetite remain excellent and his weight is stable.   He does not report any headaches or blurry vision or double vision. Does not report any chest pain shortness of breath or cough. As that report any hemoptysis or hematemesis. Does not report any leg edema or palpitation. Does not report any frequency urgency or hesitancy. Does not report any skeletal complaints back pain shoulder pain or hip pain. Does not report any arthralgias or myalgias. He does not report any rashes or lesions or  petechiae. Remainder of his review of systems unremarkable   Medications:   Current Outpatient Prescriptions  Medication Sig Dispense Refill  . allopurinol (ZYLOPRIM) 300 MG tablet Take 300 mg by mouth daily.  3  . atorvastatin (LIPITOR) 20 MG tablet Take 20 mg by mouth daily.  4  . diphenhydrAMINE (BENADRYL) 25 MG tablet Take 25 mg by mouth every 6 (six) hours as needed.    Marland Kitchen LORazepam (ATIVAN) 1 MG tablet as directed.  1  . omeprazole (PRILOSEC) 20 MG capsule Take 20 mg by mouth daily.    . promethazine (PHENERGAN) 12.5 MG tablet Take 12.5 mg by mouth every 6 (six) hours as needed. for nausea  1  . simethicone (MYLICON) 80 MG chewable tablet Chew 80 mg by mouth every 6 (six) hours as needed.    . Tamsulosin HCl (FLOMAX) 0.4 MG CAPS Take by mouth.    . Vitamin D, Cholecalciferol, 400 UNITS CHEW Chew 800 Units by mouth daily.    [name] 40 MG capsule TAKE 4 CAPSULES (160 MG TOTAL) BY MOUTH ONCE DAILY AT THE SAME TIME. MAY TAKE WITH OR WITHOUT FOOD. SWALLOW WHOLE. 120 capsule 0   No current facility-administered medications for this visit.      Allergies:  No Known Allergies  Past Medical History, Surgical history, Social history, and Family History were reviewed and updated.   Physical Exam: Blood pressure 140/63, pulse 65, temperature 98.5 F (36.9 C), temperature source Oral, resp. rate 18, height 5\' 11"  (1.803 m), weight 206 lb 12.8 oz (93.8 kg), SpO2 100 %.   ECOG: 1 General appearance: Alert, awake gentleman without  distress. Head: Normocephalic, without obvious abnormality no oral ulcers or lesions. Neck: No masses palpated. No thyromegaly. No lymphadenopathy palpated. Heart:regular rate and rhythm, S1, S2 normal, no murmur, click, rub or gallop Lung:chest clear, no wheezing, rales, normal symmetric air entry Abdomen: soft, non-tender, without masses or organomegaly no rebound or guarding. EXT:no erythema, induration, or nodules Skin:  No rashes or lesions.  Lab  Results: Lab Results  Component Value Date   WBC 8.2 11/25/2016   HGB 12.0 (L) 11/25/2016   HCT 36.4 (L) 11/25/2016   MCV 93.3 11/25/2016   PLT 169 11/25/2016     Chemistry      Component Value Date/Time   NA 137 08/20/2016 1020   K 4.5 08/20/2016 1020   CL 102 01/23/2014 1055   CO2 24 08/20/2016 1020   BUN 23.1 08/20/2016 1020   CREATININE 1.4 (H) 08/20/2016 1020      Component Value Date/Time   CALCIUM 9.4 08/20/2016 1020   ALKPHOS 69 08/20/2016 1020   AST 16 08/20/2016 1020   ALT 8 08/20/2016 1020   BILITOT 0.59 08/20/2016 1020      Results for Eric Deleon, Eric Deleon (MRN [id number]) as of 11/25/2016 11:38  Ref. Range 06/18/2016 11:35 08/20/2016 10:20  PSA Latest Ref Range: 0.0 - 4.0 ng/mL 6.2 (H) 6.8 (H)      Impression and Plan:  83 year old with:  1. Castration-resistant prostate cancer with metastatic disease to the bone. His initial diagnosis was 2007 with a Gleason score 4+3 equal 7 and a PSA of 4.   He is currently on Xtandi Since 2015 and has tolerated it well without any major complications.   His PSA Remains stable without any major changes in his overall health. The plan is to continue the same dose and schedule at this time.  2. Androgen depravation: He continues to receive androgen deprivation therapy every 3 months at [facility] urology. I recommended continuing this indefinitely.  3. Bone directed therapy: He is currently on Xgeva an we recommend for him to continue monthly. He is receiving it at [facility] urology.   4. Weight loss: His weight is stable and appetite remain excellent.  5. Small cervical adenopathy: Resolved at this time. 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  6. Followup: Will be in 3 months sooner if needed to.  [facility], MD 6/13/201811:51 AM

## 2016-11-25 NOTE — Telephone Encounter (Signed)
Scheduled appt per 6/13 los - Gave patient AVS and calender.  

## 2016-11-26 LAB — PSA: Prostate Specific Ag, Serum: 12.4 ng/mL — ABNORMAL HIGH (ref 0.0–4.0)

## 2016-12-01 DIAGNOSIS — C61 Malignant neoplasm of prostate: Secondary | ICD-10-CM | POA: Diagnosis not present

## 2016-12-01 DIAGNOSIS — C7951 Secondary malignant neoplasm of bone: Secondary | ICD-10-CM | POA: Diagnosis not present

## 2016-12-03 DIAGNOSIS — Z6829 Body mass index (BMI) 29.0-29.9, adult: Secondary | ICD-10-CM | POA: Diagnosis not present

## 2016-12-03 DIAGNOSIS — K1120 Sialoadenitis, unspecified: Secondary | ICD-10-CM | POA: Diagnosis not present

## 2016-12-04 DIAGNOSIS — K1121 Acute sialoadenitis: Secondary | ICD-10-CM | POA: Diagnosis not present

## 2016-12-09 DIAGNOSIS — K1121 Acute sialoadenitis: Secondary | ICD-10-CM | POA: Diagnosis not present

## 2016-12-21 ENCOUNTER — Other Ambulatory Visit: Payer: Self-pay | Admitting: *Deleted

## 2016-12-21 MED ORDER — XTANDI 40 MG PO CAPS: ORAL_CAPSULE | ORAL | 0 refills | Status: DC

## 2016-12-29 DIAGNOSIS — C61 Malignant neoplasm of prostate: Secondary | ICD-10-CM | POA: Diagnosis not present

## 2016-12-29 DIAGNOSIS — C7951 Secondary malignant neoplasm of bone: Secondary | ICD-10-CM | POA: Diagnosis not present

## 2016-12-29 DIAGNOSIS — C7919 Secondary malignant neoplasm of other urinary organs: Secondary | ICD-10-CM | POA: Diagnosis not present

## 2017-01-14 ENCOUNTER — Other Ambulatory Visit: Payer: Self-pay | Admitting: *Deleted

## 2017-01-14 MED ORDER — XTANDI 40 MG PO CAPS: ORAL_CAPSULE | ORAL | 0 refills | Status: DC

## 2017-01-18 ENCOUNTER — Telehealth: Payer: Self-pay

## 2017-01-18 NOTE — Telephone Encounter (Signed)
Wife called for xtandi refill.

## 2017-01-26 DIAGNOSIS — Z5111 Encounter for antineoplastic chemotherapy: Secondary | ICD-10-CM | POA: Diagnosis not present

## 2017-01-26 DIAGNOSIS — C7951 Secondary malignant neoplasm of bone: Secondary | ICD-10-CM | POA: Diagnosis not present

## 2017-01-26 DIAGNOSIS — C61 Malignant neoplasm of prostate: Secondary | ICD-10-CM | POA: Diagnosis not present

## 2017-02-23 DIAGNOSIS — C7951 Secondary malignant neoplasm of bone: Secondary | ICD-10-CM | POA: Diagnosis not present

## 2017-02-23 DIAGNOSIS — C61 Malignant neoplasm of prostate: Secondary | ICD-10-CM | POA: Diagnosis not present

## 2017-02-23 DIAGNOSIS — Z5111 Encounter for antineoplastic chemotherapy: Secondary | ICD-10-CM | POA: Diagnosis not present

## 2017-02-23 DIAGNOSIS — Z192 Hormone resistant malignancy status: Secondary | ICD-10-CM | POA: Diagnosis not present

## 2017-02-24 ENCOUNTER — Other Ambulatory Visit (HOSPITAL_BASED_OUTPATIENT_CLINIC_OR_DEPARTMENT_OTHER): Payer: Medicare Other

## 2017-02-24 ENCOUNTER — Ambulatory Visit (HOSPITAL_BASED_OUTPATIENT_CLINIC_OR_DEPARTMENT_OTHER): Payer: Medicare Other | Admitting: Oncology

## 2017-02-24 ENCOUNTER — Telehealth: Payer: Self-pay | Admitting: *Deleted

## 2017-02-24 ENCOUNTER — Telehealth: Payer: Self-pay | Admitting: Oncology

## 2017-02-24 VITALS — BP 132/89 | HR 80 | Temp 97.6°F | Resp 18 | Ht 71.0 in | Wt 203.3 lb

## 2017-02-24 DIAGNOSIS — C61 Malignant neoplasm of prostate: Secondary | ICD-10-CM

## 2017-02-24 DIAGNOSIS — C7951 Secondary malignant neoplasm of bone: Secondary | ICD-10-CM | POA: Diagnosis not present

## 2017-02-24 DIAGNOSIS — E291 Testicular hypofunction: Secondary | ICD-10-CM | POA: Diagnosis not present

## 2017-02-24 LAB — CBC WITH DIFFERENTIAL/PLATELET
BASO%: 0.9 % (ref 0.0–2.0)
Basophils Absolute: 0 10*3/uL (ref 0.0–0.1)
EOS ABS: 0.1 10*3/uL (ref 0.0–0.5)
EOS%: 1.6 % (ref 0.0–7.0)
HEMATOCRIT: 37.2 % — AB (ref 38.4–49.9)
HGB: 12.5 g/dL — ABNORMAL LOW (ref 13.0–17.1)
LYMPH%: 22.5 % (ref 14.0–49.0)
MCH: 30.9 pg (ref 27.2–33.4)
MCHC: 33.5 g/dL (ref 32.0–36.0)
MCV: 92.3 fL (ref 79.3–98.0)
MONO#: 0.4 10*3/uL (ref 0.1–0.9)
MONO%: 7.6 % (ref 0.0–14.0)
NEUT#: 3.5 10*3/uL (ref 1.5–6.5)
NEUT%: 67.4 % (ref 39.0–75.0)
PLATELETS: 155 10*3/uL (ref 140–400)
RBC: 4.03 10*6/uL — AB (ref 4.20–5.82)
RDW: 16.1 % — AB (ref 11.0–14.6)
WBC: 5.2 10*3/uL (ref 4.0–10.3)
lymph#: 1.2 10*3/uL (ref 0.9–3.3)

## 2017-02-24 LAB — COMPREHENSIVE METABOLIC PANEL
ALK PHOS: 78 U/L (ref 40–150)
ALT: 10 U/L (ref 0–55)
AST: 18 U/L (ref 5–34)
Albumin: 3.4 g/dL — ABNORMAL LOW (ref 3.5–5.0)
Anion Gap: 7 mEq/L (ref 3–11)
BILIRUBIN TOTAL: 0.48 mg/dL (ref 0.20–1.20)
BUN: 20.5 mg/dL (ref 7.0–26.0)
CALCIUM: 9.2 mg/dL (ref 8.4–10.4)
CO2: 26 mEq/L (ref 22–29)
Chloride: 106 mEq/L (ref 98–109)
Creatinine: 1.6 mg/dL — ABNORMAL HIGH (ref 0.7–1.3)
EGFR: 40 mL/min/{1.73_m2} — AB (ref >90)
GLUCOSE: 99 mg/dL (ref 70–140)
Potassium: 4.2 mEq/L (ref 3.5–5.1)
Sodium: 140 mEq/L (ref 136–145)
Total Protein: 6.5 g/dL (ref 6.4–8.3)

## 2017-02-24 NOTE — Telephone Encounter (Signed)
Gave patient AVS and calendar of upcoming November appointments.  °

## 2017-02-24 NOTE — Telephone Encounter (Signed)
Received call from [facility] asking for return call regardin RF on xtandi 40 mg.  Return call # [phone] & fax # [phone]  They are open 8-5 central time.  Message to Dr Corliss Parish RN

## 2017-02-24 NOTE — Progress Notes (Signed)
Hematology and Oncology Follow Up Visit  Eric Deleon [id number] 1932-05-22 83 y.o. 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 He was diagnosed in 2007 with a Gleason score 4+3 equals 7 and a PSA of 4.   Prior Therapy:  He received a definitive therapy with radiation therapy and developed biochemical relapse initially treated with androgen deprivation.  He developed bony metastasis including a frontal bone metastasis and was treated with radiation therapy in February of 2013. PSA was up to 13.38 despite castration levels of testosterone. His most recent bone scan in June of 2015 showed progression of disease.   Current therapy: Xtandi 160 mg daily started in August of 2015.  Interim History: Eric Deleon presents today for a followup visit. Since his last visit, he reports doing well. He continues Xtandi without any new complications. He denied any seizure or syncope. He does not report any abdominal pain or vomiting. He denied any recent side effects associated with this medication. His appetite remain excellent and his weight is stable. He denied any pathological fractures or syncope. His quality of life and performance status is not dramatically changed. He did have a tooth abscess that was treated with antibiotics under the care of Dr. Lucia Deleon.   He does not report any headaches or blurry vision or double vision. Does not report any chest pain shortness of breath or cough. As that report any hemoptysis or hematemesis. Does not report any leg edema or palpitation. Does not report any frequency urgency or hesitancy. Does not report any skeletal complaints back pain shoulder pain or hip pain. Does not report any arthralgias or myalgias. He does not report any rashes or lesions or petechiae. Remainder of his review of systems  unremarkable   Medications:   Current Outpatient Prescriptions  Medication Sig Dispense Refill  . allopurinol (ZYLOPRIM) 300 MG tablet Take 300 mg by mouth daily.  3  . atorvastatin (LIPITOR) 20 MG tablet Take 20 mg by mouth daily.  4  . diphenhydrAMINE (BENADRYL) 25 MG tablet Take 25 mg by mouth every 6 (six) hours as needed.    Marland Kitchen LORazepam (ATIVAN) 1 MG tablet as directed.  1  . omeprazole (PRILOSEC) 20 MG capsule Take 20 mg by mouth daily.    . promethazine (PHENERGAN) 12.5 MG tablet Take 12.5 mg by mouth every 6 (six) hours as needed. for nausea  1  . simethicone (MYLICON) 80 MG chewable tablet Chew 80 mg by mouth every 6 (six) hours as needed.    . Tamsulosin HCl (FLOMAX) 0.4 MG CAPS Take by mouth.    . Vitamin D, Cholecalciferol, 400 UNITS CHEW Chew 800 Units by mouth daily.    Eric Deleon 40 MG capsule TAKE 4 CAPSULES (160 MG TOTAL) BY MOUTH ONCE DAILY AT THE SAME TIME. MAY TAKE WITH OR WITHOUT FOOD. SWALLOW WHOLE. 120 capsule 0   No current facility-administered medications for this visit.      Allergies:  No Known Allergies  Past Medical History, Surgical history, Social history, and Family History were reviewed and updated.   Physical Exam: Blood pressure 132/89, pulse 80, temperature 97.6 F (36.4 C), temperature source Oral, resp. rate 18, height 5\' 11"  (1.803 m), weight 203 lb 4.8 oz (92.2 kg), SpO2 100 %.   ECOG: 1 General appearance: Well-appearing gentleman without distress. Head: Normocephalic, without obvious abnormality no oral thrush or ulcers. Neck:  No masses palpated. No thyromegaly. No lymphadenopathy palpated. Heart:regular rate and rhythm, S1, S2 normal, no murmur, click, rub or gallop Lung:chest clear, no wheezing, rales, normal symmetric air entry Abdomen: soft, non-tender, without masses or organomegaly no shifting dullness or ascites. EXT:no erythema, induration, or nodules Skin:  No rashes or lesions.  Lab Results: Lab Results  Component Value  Date   WBC 5.2 02/24/2017   HGB 12.5 (L) 02/24/2017   HCT 37.2 (L) 02/24/2017   MCV 92.3 02/24/2017   PLT 155 02/24/2017     Chemistry      Component Value Date/Time   NA 140 11/25/2016 1109   K 4.6 11/25/2016 1109   CL 102 01/23/2014 1055   CO2 26 11/25/2016 1109   BUN 25.6 11/25/2016 1109   CREATININE 1.6 (H) 11/25/2016 1109      Component Value Date/Time   CALCIUM 9.5 11/25/2016 1109   ALKPHOS 85 11/25/2016 1109   AST 12 11/25/2016 1109   ALT 7 11/25/2016 1109   BILITOT 0.39 11/25/2016 1109     Results for Eric Deleon, Eric Deleon (MRN [id number]) as of 02/24/2017 11:35  Ref. Range 06/18/2016 11:35 08/20/2016 10:20 11/25/2016 11:09  Prostate Specific Ag, Serum Latest Ref Range: 0.0 - 4.0 ng/mL 6.2 (H) 6.8 (H) 12.4 (H)        Impression and Plan:  83 year old with:  1. Castration-resistant prostate cancer with metastatic disease to the bone. His initial diagnosis was 2007 with a Gleason score 4+3 equal 7 and a PSA of 4.   He is currently on Xtandi Since 2015 and has tolerated it well without any major complications.   His PSA did increase to 12.4 in June 2018. The plan is to repeat his PSA today and decide on the next course of action. If his PSA remains relatively stable, we will keep him on extending with the same dose and schedule. If his PSA continues to rise, we will switch to [facility].  Risks and benefits associated with Zytiga at 1000 mg daily with prednisone 5 mg daily was discussed today in detail. Complications include edema, hypokalemia, liver function abnormalities among others were discussed. He is agreeable to proceed if his PSA rises.  2. Androgen depravation: He continues to receive androgen deprivation therapy every 3 months at [facility] urology. I recommended continuing this indefinitely.  3. Bone directed therapy: He is currently on Xgeva an we recommend for him to continue monthly. He is receiving it at [facility] urology.   4. Dental abscess: Resolved at this  time.  5. Small cervical adenopathy: Resolved at this time. 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  6. Followup: Will be in 2 months.  Zola Button, MD 9/12/201811:51 AM

## 2017-02-24 NOTE — Telephone Encounter (Signed)
No refill for now till we see his PSA

## 2017-02-25 ENCOUNTER — Telehealth: Payer: Self-pay | Admitting: *Deleted

## 2017-02-25 ENCOUNTER — Other Ambulatory Visit: Payer: Self-pay | Admitting: *Deleted

## 2017-02-25 LAB — PSA: PROSTATE SPECIFIC AG, SERUM: 15 ng/mL — AB (ref 0.0–4.0)

## 2017-02-25 MED ORDER — XTANDI 40 MG PO CAPS: ORAL_CAPSULE | ORAL | 0 refills | Status: DC

## 2017-02-25 NOTE — Telephone Encounter (Signed)
As noted below by Dr. Alen Blew, I informed patient's wife of the PSA level. Also, to continue [place] for now. Patient does need a refill on Xtandi. I told her I would get that refilled today. She verbalized understanding.

## 2017-02-25 NOTE — Telephone Encounter (Signed)
-----   Message from Wyatt Portela, MD sent at 02/25/2017  8:54 AM EDT ----- Please let him know his PSA is up slightly. We will stay with the [facility] for now.

## 2017-03-05 ENCOUNTER — Telehealth: Payer: Self-pay

## 2017-03-05 NOTE — Telephone Encounter (Signed)
[place] pt assistance pharmacy called to get renewal on Xtandi. This is a few weeks in advance.  Next OV 11/14

## 2017-03-08 NOTE — Telephone Encounter (Signed)
Not my patient

## 2017-03-09 ENCOUNTER — Encounter: Payer: Self-pay | Admitting: *Deleted

## 2017-03-09 ENCOUNTER — Other Ambulatory Visit: Payer: Self-pay | Admitting: *Deleted

## 2017-03-09 MED ORDER — XTANDI 40 MG PO CAPS: ORAL_CAPSULE | ORAL | 0 refills | Status: DC

## 2017-03-23 DIAGNOSIS — C61 Malignant neoplasm of prostate: Secondary | ICD-10-CM | POA: Diagnosis not present

## 2017-03-23 DIAGNOSIS — C7951 Secondary malignant neoplasm of bone: Secondary | ICD-10-CM | POA: Diagnosis not present

## 2017-03-24 ENCOUNTER — Other Ambulatory Visit: Payer: Self-pay | Admitting: *Deleted

## 2017-03-24 MED ORDER — XTANDI 40 MG PO CAPS: ORAL_CAPSULE | ORAL | 0 refills | Status: DC

## 2017-04-12 DIAGNOSIS — Z23 Encounter for immunization: Secondary | ICD-10-CM | POA: Diagnosis not present

## 2017-04-28 ENCOUNTER — Telehealth: Payer: Self-pay | Admitting: Oncology

## 2017-04-28 ENCOUNTER — Other Ambulatory Visit (HOSPITAL_BASED_OUTPATIENT_CLINIC_OR_DEPARTMENT_OTHER): Payer: Medicare Other

## 2017-04-28 ENCOUNTER — Ambulatory Visit (HOSPITAL_BASED_OUTPATIENT_CLINIC_OR_DEPARTMENT_OTHER): Payer: Medicare Other | Admitting: Oncology

## 2017-04-28 VITALS — BP 134/74 | HR 73 | Temp 98.2°F | Resp 18 | Ht 71.0 in | Wt 204.1 lb

## 2017-04-28 DIAGNOSIS — E291 Testicular hypofunction: Secondary | ICD-10-CM | POA: Diagnosis not present

## 2017-04-28 DIAGNOSIS — C61 Malignant neoplasm of prostate: Secondary | ICD-10-CM

## 2017-04-28 DIAGNOSIS — C7951 Secondary malignant neoplasm of bone: Secondary | ICD-10-CM

## 2017-04-28 LAB — COMPREHENSIVE METABOLIC PANEL
ALBUMIN: 3.5 g/dL (ref 3.5–5.0)
ALK PHOS: 81 U/L (ref 40–150)
ALT: 10 U/L (ref 0–55)
AST: 14 U/L (ref 5–34)
Anion Gap: 7 mEq/L (ref 3–11)
BUN: 24.1 mg/dL (ref 7.0–26.0)
CHLORIDE: 104 meq/L (ref 98–109)
CO2: 25 meq/L (ref 22–29)
Calcium: 9.2 mg/dL (ref 8.4–10.4)
Creatinine: 1.8 mg/dL — ABNORMAL HIGH (ref 0.7–1.3)
EGFR: 34 mL/min/{1.73_m2} — AB (ref >60)
GLUCOSE: 93 mg/dL (ref 70–140)
POTASSIUM: 4.6 meq/L (ref 3.5–5.1)
SODIUM: 136 meq/L (ref 136–145)
Total Bilirubin: 0.36 mg/dL (ref 0.20–1.20)
Total Protein: 7.1 g/dL (ref 6.4–8.3)

## 2017-04-28 LAB — CBC WITH DIFFERENTIAL/PLATELET
BASO%: 0.9 % (ref 0.0–2.0)
Basophils Absolute: 0 10*3/uL (ref 0.0–0.1)
EOS%: 2.4 % (ref 0.0–7.0)
Eosinophils Absolute: 0.1 10*3/uL (ref 0.0–0.5)
HEMATOCRIT: 36.4 % — AB (ref 38.4–49.9)
HGB: 12 g/dL — ABNORMAL LOW (ref 13.0–17.1)
LYMPH#: 1.3 10*3/uL (ref 0.9–3.3)
LYMPH%: 22.5 % (ref 14.0–49.0)
MCH: 30.3 pg (ref 27.2–33.4)
MCHC: 33 g/dL (ref 32.0–36.0)
MCV: 91.6 fL (ref 79.3–98.0)
MONO#: 0.4 10*3/uL (ref 0.1–0.9)
MONO%: 7 % (ref 0.0–14.0)
NEUT#: 3.8 10*3/uL (ref 1.5–6.5)
NEUT%: 67.2 % (ref 39.0–75.0)
PLATELETS: 200 10*3/uL (ref 140–400)
RBC: 3.97 10*6/uL — ABNORMAL LOW (ref 4.20–5.82)
RDW: 15.7 % — ABNORMAL HIGH (ref 11.0–14.6)
WBC: 5.6 10*3/uL (ref 4.0–10.3)

## 2017-04-28 NOTE — Progress Notes (Signed)
Hematology and Oncology Follow Up Visit  Eric Deleon [id number] 08/09/1931 83 y.o. 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 He was diagnosed in 2007 with a Gleason score 4+3 equals 7 and a PSA of 4.   Prior Therapy:  He received a definitive therapy with radiation therapy and developed biochemical relapse initially treated with androgen deprivation.  He developed bony metastasis including a frontal bone metastasis and was treated with radiation therapy in February of 2013. PSA was up to 13.38 despite castration levels of testosterone. His most recent bone scan in June of 2015 showed progression of disease.   Current therapy: Xtandi 160 mg daily started in August of 2015.  Interim History: Eric Deleon presents today for a followup visit. Since his last visit, he reports no changes in his health. He continues Xtandi without any new complications. He denied any nausea, vomiting, seizure or syncope. He does not report any abdominal pain. He denied any recent side effects associated with this medication. His appetite remain excellent and his weight is stable. He denied any pathological fractures or syncope.  He continues to ambulate without any difficulties.  His quality of life and performance status is unchanged.   He does not report any headaches or blurry vision or double vision. Does not report any chest pain shortness of breath or cough. As that report any hemoptysis or hematemesis. Does not report any leg edema or palpitation. Does not report any frequency urgency or hesitancy. Does not report any skeletal complaints back pain shoulder pain or hip pain. Does not report any arthralgias or myalgias. He does not report any rashes or lesions or petechiae. Remainder of his review of systems unremarkable   Medications:   Current Outpatient  Medications  Medication Sig Dispense Refill  . allopurinol (ZYLOPRIM) 300 MG tablet Take 300 mg by mouth daily.  3  . atorvastatin (LIPITOR) 20 MG tablet Take 20 mg by mouth daily.  4  . diphenhydrAMINE (BENADRYL) 25 MG tablet Take 25 mg by mouth every 6 (six) hours as needed.    Marland Kitchen LORazepam (ATIVAN) 1 MG tablet as directed.  1  . omeprazole (PRILOSEC) 20 MG capsule Take 20 mg by mouth daily.    . promethazine (PHENERGAN) 12.5 MG tablet Take 12.5 mg by mouth every 6 (six) hours as needed. for nausea  1  . simethicone (MYLICON) 80 MG chewable tablet Chew 80 mg by mouth every 6 (six) hours as needed.    . Tamsulosin HCl (FLOMAX) 0.4 MG CAPS Take by mouth.    . Vitamin D, Cholecalciferol, 400 UNITS CHEW Chew 800 Units by mouth daily.    [name] 40 MG capsule TAKE 4 CAPSULES (160 MG TOTAL) BY MOUTH ONCE DAILY AT THE SAME TIME. MAY TAKE WITH OR WITHOUT FOOD. SWALLOW WHOLE. 120 capsule 0   No current facility-administered medications for this visit.      Allergies:  No Known Allergies  Past Medical History, Surgical history, Social history, and Family History were reviewed and updated.   Physical Exam: Blood pressure 134/74, pulse 73, temperature 98.2 F (36.8 C), temperature source Oral, resp. rate 18, height 5\' 11"  (1.803 m), weight 204 lb 1.6 oz (92.6 kg), SpO2 100 %.   ECOG: 1 General appearance: Alert, awake gentleman without distress. Head: Normocephalic, without obvious abnormality no oral ulcers or lesions.. Neck:  No thyroid masses or lymphadenopathy.  Heart:regular rate and rhythm, S1, S2 normal, no murmur, click, rub or gallop Lung:chest clear, no wheezing, rales, normal symmetric air entry Abdomen: soft, non-tender, without masses or organomegaly no rebound or guarding. EXT:no erythema, induration, or nodules Skin:  No rashes or lesions.  Lab Results: Lab Results  Component Value Date   WBC 5.6 04/28/2017   HGB 12.0 (L) 04/28/2017   HCT 36.4 (L) 04/28/2017   MCV 91.6  04/28/2017   PLT 200 04/28/2017     Chemistry      Component Value Date/Time   NA 140 02/24/2017 1119   K 4.2 02/24/2017 1119   CL 102 01/23/2014 1055   CO2 26 02/24/2017 1119   BUN 20.5 02/24/2017 1119   CREATININE 1.6 (H) 02/24/2017 1119      Component Value Date/Time   CALCIUM 9.2 02/24/2017 1119   ALKPHOS 78 02/24/2017 1119   AST 18 02/24/2017 1119   ALT 10 02/24/2017 1119   BILITOT 0.48 02/24/2017 1119     Results for Eric Deleon (MRN [id number]) as of 04/28/2017 11:30  Ref. Range 08/20/2016 10:20 11/25/2016 11:09 02/24/2017 11:19  Prostate Specific Ag, Serum Latest Ref Range: 0.0 - 4.0 ng/mL 6.8 (H) 12.4 (H) 15.0 (H)         Impression and Plan:  83 year old with:  1. Castration-resistant prostate cancer with metastatic disease to the bone. His initial diagnosis was 2007 with a Gleason score 4+3 equal 7 and a PSA of 4.   He is currently on Xtandi Since 2015 and has tolerated it well without any major complications.   His PSA showed very slight increase between June and September 2018 and he continues to be asymptomatic.  Options of therapy would include continuing Xtandi or switching to [place].  Systemic chemotherapy would be also an option if he becomes symptomatic.  Risks and benefits associated with Zytiga at 1000 mg daily with prednisone 5 mg daily was discussed today in detail. Complications include edema, hypokalemia, liver function abnormalities among others were discussed.  He has no objections to proceed with this medication if his PSA rises rapidly.  We will await the results of his PSA from 04/28/2017 and make that determination.  Written information was given to the patient today.  2. Androgen depravation: He continues to receive androgen deprivation therapy every 3 months at [facility] urology. I recommended continuing this indefinitely.  3. Bone directed therapy: He is currently on Xgeva an we recommend for him to continue monthly. He is receiving it at  [facility] urology.   4. Followup: Will be in 2 months.  Eric Button, MD 11/14/201811:47 AM

## 2017-04-28 NOTE — Telephone Encounter (Signed)
Gave avs and calendar for January 2019 °

## 2017-04-29 ENCOUNTER — Telehealth: Payer: Self-pay | Admitting: *Deleted

## 2017-04-29 LAB — PSA: Prostate Specific Ag, Serum: 24.1 ng/mL — ABNORMAL HIGH (ref 0.0–4.0)

## 2017-04-29 NOTE — Telephone Encounter (Signed)
-----   Message from Wyatt Portela, MD sent at 04/29/2017  8:13 AM EST ----- Please let him know his PSA is up but no changes for now.

## 2017-04-29 NOTE — Telephone Encounter (Signed)
As noted below by Dr. Alen Blew, I informed wife that his PSA is up but no changes for now. She verbalized understanding.

## 2017-05-03 DIAGNOSIS — C61 Malignant neoplasm of prostate: Secondary | ICD-10-CM | POA: Diagnosis not present

## 2017-05-03 DIAGNOSIS — Z192 Hormone resistant malignancy status: Secondary | ICD-10-CM | POA: Diagnosis not present

## 2017-05-03 DIAGNOSIS — C775 Secondary and unspecified malignant neoplasm of intrapelvic lymph nodes: Secondary | ICD-10-CM | POA: Diagnosis not present

## 2017-05-03 DIAGNOSIS — C7951 Secondary malignant neoplasm of bone: Secondary | ICD-10-CM | POA: Diagnosis not present

## 2017-05-04 ENCOUNTER — Encounter: Payer: Self-pay | Admitting: *Deleted

## 2017-05-18 DIAGNOSIS — C61 Malignant neoplasm of prostate: Secondary | ICD-10-CM | POA: Diagnosis not present

## 2017-05-18 DIAGNOSIS — R109 Unspecified abdominal pain: Secondary | ICD-10-CM | POA: Diagnosis not present

## 2017-05-25 ENCOUNTER — Telehealth: Payer: Self-pay | Admitting: Pharmacy Technician

## 2017-05-25 NOTE — Telephone Encounter (Signed)
Oral Oncology Patient Advocate Encounter  Received communication from [employer] that the patient's eligibility in the patient assistance program was due for re-enrollment.  The renewal application has been completed and faxed in an effort to keep the patient's out of pocket expense for Xtandi at $0.    Application completed and faxed to [phone].   [place] phone number for follow up is [phone].   This encounter will be updated until final determination.  Fabio Asa. Melynda Keller, [place] Patient [place] [phone] 05/25/2017 3:47 PM

## 2017-05-26 ENCOUNTER — Other Ambulatory Visit: Payer: Self-pay | Admitting: *Deleted

## 2017-05-26 MED ORDER — XTANDI 40 MG PO CAPS: ORAL_CAPSULE | ORAL | 0 refills | Status: DC

## 2017-06-16 DIAGNOSIS — C7951 Secondary malignant neoplasm of bone: Secondary | ICD-10-CM | POA: Diagnosis not present

## 2017-06-16 DIAGNOSIS — C61 Malignant neoplasm of prostate: Secondary | ICD-10-CM | POA: Diagnosis not present

## 2017-06-29 ENCOUNTER — Ambulatory Visit: Payer: Medicare Other | Admitting: Oncology

## 2017-06-29 ENCOUNTER — Other Ambulatory Visit: Payer: Medicare Other

## 2017-06-29 NOTE — Telephone Encounter (Signed)
Oral Oncology Patient Advocate Encounter  Received notification from [facility] Patient Assistance program that patient has been successfully re-enrolled into their program to continue to receive Xtandi from the manufacturer at $0 out of pocket until 06/14/2018.   I called and left a message for the patient  Oral Oncology Clinic will continue to follow.  Fabio Asa. Melynda Keller, [place] Patient [place] [phone] 06/29/2017 1:17 PM

## 2017-07-14 DIAGNOSIS — C61 Malignant neoplasm of prostate: Secondary | ICD-10-CM | POA: Diagnosis not present

## 2017-07-14 DIAGNOSIS — C7951 Secondary malignant neoplasm of bone: Secondary | ICD-10-CM | POA: Diagnosis not present

## 2017-07-23 ENCOUNTER — Other Ambulatory Visit: Payer: Self-pay | Admitting: *Deleted

## 2017-07-23 ENCOUNTER — Telehealth: Payer: Self-pay | Admitting: *Deleted

## 2017-07-23 MED ORDER — XTANDI 40 MG PO CAPS: ORAL_CAPSULE | ORAL | 0 refills | Status: DC

## 2017-07-23 NOTE — Telephone Encounter (Signed)
Received call from [place] with Pt Assistance/[facility] asking for new script for xtandi 40 mg on this pt.  Fax # is [phone].  Message to Dr Corliss Parish RN

## 2017-08-11 ENCOUNTER — Inpatient Hospital Stay: Payer: Medicare Other | Attending: Oncology

## 2017-08-11 ENCOUNTER — Inpatient Hospital Stay (HOSPITAL_BASED_OUTPATIENT_CLINIC_OR_DEPARTMENT_OTHER): Payer: Medicare Other | Admitting: Oncology

## 2017-08-11 ENCOUNTER — Telehealth: Payer: Self-pay | Admitting: Oncology

## 2017-08-11 VITALS — BP 147/77 | HR 75 | Temp 98.5°F | Resp 18 | Ht 71.0 in | Wt 211.9 lb

## 2017-08-11 DIAGNOSIS — E291 Testicular hypofunction: Secondary | ICD-10-CM

## 2017-08-11 DIAGNOSIS — R609 Edema, unspecified: Secondary | ICD-10-CM | POA: Diagnosis not present

## 2017-08-11 DIAGNOSIS — C61 Malignant neoplasm of prostate: Secondary | ICD-10-CM | POA: Diagnosis not present

## 2017-08-11 DIAGNOSIS — C7951 Secondary malignant neoplasm of bone: Secondary | ICD-10-CM

## 2017-08-11 LAB — CBC WITH DIFFERENTIAL/PLATELET
BASOS ABS: 0 10*3/uL (ref 0.0–0.1)
BASOS PCT: 0 %
EOS ABS: 0.1 10*3/uL (ref 0.0–0.5)
Eosinophils Relative: 2 %
HCT: 34.2 % — ABNORMAL LOW (ref 38.4–49.9)
HEMOGLOBIN: 11.3 g/dL — AB (ref 13.0–17.1)
Lymphocytes Relative: 23 %
Lymphs Abs: 1.4 10*3/uL (ref 0.9–3.3)
MCH: 30.5 pg (ref 27.2–33.4)
MCHC: 33 g/dL (ref 32.0–36.0)
MCV: 92.2 fL (ref 79.3–98.0)
Monocytes Absolute: 0.4 10*3/uL (ref 0.1–0.9)
Monocytes Relative: 6 %
NEUTROS ABS: 4.1 10*3/uL (ref 1.5–6.5)
NEUTROS PCT: 69 %
Platelets: 170 10*3/uL (ref 140–400)
RBC: 3.71 MIL/uL — AB (ref 4.20–5.82)
RDW: 15 % — ABNORMAL HIGH (ref 11.0–14.6)
WBC: 5.9 10*3/uL (ref 4.0–10.3)

## 2017-08-11 LAB — COMPREHENSIVE METABOLIC PANEL
ALK PHOS: 88 U/L (ref 40–150)
ALT: 8 U/L (ref 0–55)
ANION GAP: 9 (ref 3–11)
AST: 14 U/L (ref 5–34)
Albumin: 3.3 g/dL — ABNORMAL LOW (ref 3.5–5.0)
BUN: 21 mg/dL (ref 7–26)
CALCIUM: 9.3 mg/dL (ref 8.4–10.4)
CO2: 25 mmol/L (ref 22–29)
CREATININE: 1.61 mg/dL — AB (ref 0.70–1.30)
Chloride: 105 mmol/L (ref 98–109)
GFR, EST AFRICAN AMERICAN: 44 mL/min — AB (ref >60)
GFR, EST NON AFRICAN AMERICAN: 38 mL/min — AB (ref >60)
Glucose, Bld: 98 mg/dL (ref 70–140)
Potassium: 4.4 mmol/L (ref 3.5–5.1)
Sodium: 139 mmol/L (ref 136–145)
Total Bilirubin: 0.4 mg/dL (ref 0.2–1.2)
Total Protein: 6.4 g/dL (ref 6.4–8.3)

## 2017-08-11 NOTE — Telephone Encounter (Signed)
Appointments scheduled AVS/Calendar printed per 2/27 los °

## 2017-08-11 NOTE — Progress Notes (Signed)
Hematology and Oncology Follow Up Visit  Eric Deleon [id number] 1931/12/30 84 y.o. 08/11/2017 10:36 AM Eric Deleon, MDPerini, [name], MD   Principle Diagnosis: 84 year old man with advanced castration-resistant prostate cancer.  He was initially diagnosed with Gleason score 4+3 = 7 and a PSA for in 2007.  He has bony metastasis as well as adenopathy.    Prior Therapy:  He received a definitive therapy with radiation therapy and developed biochemical relapse initially treated with androgen deprivation.  He developed bony metastasis including a frontal bone metastasis and was treated with radiation therapy in February of 2013. PSA was up to 13.38 despite castration levels of testosterone in 2015 indicating castration resistant disease.  Current therapy: Xtandi 160 mg daily started in August of 2015.  Interim History: Eric Deleon is here for a follow-up visit.  Since the last visit, reports no major changes in his health.  He does report bilateral lower extremity edema which has been recurrent in nature.  He continues to take [facility] without any new complications.  He denies any excessive fatigue, tiredness or bone pain.  He denies any abdominal distention or weight loss.  His quality of life and performance status not dramatically changed.  He still able to ambulate without any falls or syncope.  He does not report any headaches or blurry vision or double vision.  He does not report any fevers, chills or weight loss.  Does not report any chest pain shortness of breath or cough. As that report any hemoptysis or hematemesis.  He does not report any nausea, vomiting, early satiety, constipation or diarrhea.  Does not report any frequency urgency or hesitancy. Does not report any skeletal complaints back pain shoulder pain or hip pain. Does not report any arthralgias or myalgias. He does not report any rashes or lesions or petechiae.  Does not report any anxiety or depression.  Remainder of his review of  systems is negative.   Medications:   Current Outpatient Medications  Medication Sig Dispense Refill  . allopurinol (ZYLOPRIM) 300 MG tablet Take 300 mg by mouth daily.  3  . atorvastatin (LIPITOR) 20 MG tablet Take 20 mg by mouth daily.  4  . diphenhydrAMINE (BENADRYL) 25 MG tablet Take 25 mg by mouth every 6 (six) hours as needed.    Marland Kitchen LORazepam (ATIVAN) 1 MG tablet as directed.  1  . omeprazole (PRILOSEC) 20 MG capsule Take 20 mg by mouth daily.    . promethazine (PHENERGAN) 12.5 MG tablet Take 12.5 mg by mouth every 6 (six) hours as needed. for nausea  1  . simethicone (MYLICON) 80 MG chewable tablet Chew 80 mg by mouth every 6 (six) hours as needed.    . Tamsulosin HCl (FLOMAX) 0.4 MG CAPS Take by mouth.    . Vitamin D, Cholecalciferol, 400 UNITS CHEW Chew 800 Units by mouth daily.    [name] 40 MG capsule TAKE 4 CAPSULES (160 MG TOTAL) BY MOUTH ONCE DAILY AT THE SAME TIME. MAY TAKE WITH OR WITHOUT FOOD. SWALLOW WHOLE. 120 capsule 0   No current facility-administered medications for this visit.      Allergies:  No Known Allergies  Past Medical History, Surgical history, Social history, and Family History were reviewed and updated.   Physical Exam: Blood pressure (!) 147/77, pulse 75, temperature 98.5 F (36.9 C), temperature source Oral, resp. rate 18, height 5\' 11"  (1.803 m), weight 211 lb 14.4 oz (96.1 kg), SpO2 100 %.   ECOG: 1 General appearance: Well-appearing gentleman  without distress. Head: Normocephalic, without obvious abnormality  Oral mucosa without thrush or ulcers.   Eyes: No scleral icterus. Heart:regular rate and rhythm, S1, S2 normal, no murmur, click, rub or gallop Lung:chest clear, no wheezing, rales, normal symmetric air entry Abdomen: Soft, nontender without any rebound or guarding.  No shifting dullness or ascites. Musculoskeletal: No joint deformity or effusion.  Bilateral lower extremity edema noted. Skin:  No petechia or  ecchymosis. Neurological: No motor, sensory deficits.  Lab Results: Lab Results  Component Value Date   WBC 5.9 08/11/2017   HGB 11.3 (L) 08/11/2017   HCT 34.2 (L) 08/11/2017   MCV 92.2 08/11/2017   PLT 170 08/11/2017     Chemistry      Component Value Date/Time   NA 136 04/28/2017 1123   K 4.6 04/28/2017 1123   CL 102 01/23/2014 1055   CO2 25 04/28/2017 1123   BUN 24.1 04/28/2017 1123   CREATININE 1.8 (H) 04/28/2017 1123      Component Value Date/Time   CALCIUM 9.2 04/28/2017 1123   ALKPHOS 81 04/28/2017 1123   AST 14 04/28/2017 1123   ALT 10 04/28/2017 1123   BILITOT 0.36 04/28/2017 1123             Impression and Plan:  84 year old with:  1. Castration-resistant prostate cancer with metastatic disease with lymphadenopathy and bone involvement.  He received definitive therapy at the time of diagnosis in 2007 but developed castration resistant disease in 2015.  He has been on Xtandi since that time with reasonable response to therapy.  PSA has been rising with a doubling time of close to 6 months.  CT scan obtained in December 2018 by Dr. Diona Deleon was personally reviewed today and discussed with the patient.  His disease showed reasonable response to therapy with very minimal progression and some regression of some his lymphadenopathy.  The natural course of this disease and treatment options were reviewed today with the patient and his wife.  His options were include continuing Xtandi, switching to a different agent such as Xofigo, systemic chemotherapy or possible Zytiga.  After discussion today, given the fact that he is asymptomatic and his disease is relatively stable, the plan is to continue with the same treatment and repeat bone scan with the next visit.  Different salvage therapy can be utilized to time depending on his symptoms and PSA rise.  2. Androgen depravation: I recommended continuing Lupron indefinitely.  He receives that under the care of Dr.  Diona Deleon.  3. Bone directed therapy: He is at risk of developing skeletal related events.  I recommended continuing Xgeva on a monthly basis as he is doing in [facility] care of [facility].  4. Followup: Will be in May 2019 for repeat imaging studies.  25  minutes was spent with the patient face-to-face today.  More than 50% of time was dedicated to patient counseling, education and coordination of the patient's multifaceted care.   Zola Button, MD 2/27/201910:36 AM

## 2017-08-12 ENCOUNTER — Telehealth: Payer: Self-pay | Admitting: *Deleted

## 2017-08-12 LAB — PROSTATE-SPECIFIC AG, SERUM (LABCORP): PROSTATE SPECIFIC AG, SERUM: 49.3 ng/mL — AB (ref 0.0–4.0)

## 2017-08-12 NOTE — Telephone Encounter (Signed)
-----   Message from Eric Portela, MD sent at 08/12/2017  8:06 AM EST ----- Please let him know his PSA is up. No change for now like we discussed.

## 2017-08-12 NOTE — Telephone Encounter (Signed)
Spoke with patient's wife, gave results of last PSA. No change for now.

## 2017-08-13 DIAGNOSIS — Z192 Hormone resistant malignancy status: Secondary | ICD-10-CM | POA: Diagnosis not present

## 2017-08-13 DIAGNOSIS — C61 Malignant neoplasm of prostate: Secondary | ICD-10-CM | POA: Diagnosis not present

## 2017-08-13 DIAGNOSIS — C7951 Secondary malignant neoplasm of bone: Secondary | ICD-10-CM | POA: Diagnosis not present

## 2017-08-30 ENCOUNTER — Other Ambulatory Visit: Payer: Self-pay | Admitting: *Deleted

## 2017-08-30 MED ORDER — XTANDI 40 MG PO CAPS: ORAL_CAPSULE | ORAL | 0 refills | Status: DC

## 2017-08-30 NOTE — Telephone Encounter (Signed)
Refill for [facility] sent to [facility]. Pt last seen 02/27 per progress note pt to continue Xtandi at this time.  Call to pt pharmacy to verify refill received.

## 2017-09-07 DIAGNOSIS — R7301 Impaired fasting glucose: Secondary | ICD-10-CM | POA: Diagnosis not present

## 2017-09-07 DIAGNOSIS — Z125 Encounter for screening for malignant neoplasm of prostate: Secondary | ICD-10-CM | POA: Diagnosis not present

## 2017-09-07 DIAGNOSIS — R82998 Other abnormal findings in urine: Secondary | ICD-10-CM | POA: Diagnosis not present

## 2017-09-07 DIAGNOSIS — M109 Gout, unspecified: Secondary | ICD-10-CM | POA: Diagnosis not present

## 2017-09-07 DIAGNOSIS — E785 Hyperlipidemia, unspecified: Secondary | ICD-10-CM | POA: Diagnosis not present

## 2017-09-10 DIAGNOSIS — C7951 Secondary malignant neoplasm of bone: Secondary | ICD-10-CM | POA: Diagnosis not present

## 2017-09-10 DIAGNOSIS — C61 Malignant neoplasm of prostate: Secondary | ICD-10-CM | POA: Diagnosis not present

## 2017-09-23 ENCOUNTER — Other Ambulatory Visit: Payer: Self-pay | Admitting: Oncology

## 2017-10-25 ENCOUNTER — Telehealth: Payer: Self-pay | Admitting: Oncology

## 2017-10-25 NOTE — Telephone Encounter (Signed)
Spoke to patients wife regarding vm she left earlier to r/s lab appointment to earlier time.

## 2017-10-26 ENCOUNTER — Inpatient Hospital Stay: Payer: Medicare Other | Attending: Oncology

## 2017-10-26 ENCOUNTER — Encounter (HOSPITAL_COMMUNITY)
Admission: RE | Admit: 2017-10-26 | Discharge: 2017-10-26 | Disposition: A | Discharge status: Home / Self Care | Payer: Medicare Other | Source: Ambulatory Visit | Attending: Oncology | Admitting: Oncology

## 2017-10-26 DIAGNOSIS — C61 Malignant neoplasm of prostate: Secondary | ICD-10-CM | POA: Insufficient documentation

## 2017-10-26 DIAGNOSIS — R110 Nausea: Secondary | ICD-10-CM | POA: Insufficient documentation

## 2017-10-26 DIAGNOSIS — C7951 Secondary malignant neoplasm of bone: Secondary | ICD-10-CM | POA: Insufficient documentation

## 2017-10-26 DIAGNOSIS — R5382 Chronic fatigue, unspecified: Secondary | ICD-10-CM | POA: Diagnosis not present

## 2017-10-26 DIAGNOSIS — E291 Testicular hypofunction: Secondary | ICD-10-CM | POA: Diagnosis not present

## 2017-10-26 LAB — CBC WITH DIFFERENTIAL (CANCER CENTER ONLY)
BASOS ABS: 0.1 10*3/uL (ref 0.0–0.1)
Basophils Relative: 1 %
EOS PCT: 2 %
Eosinophils Absolute: 0.1 10*3/uL (ref 0.0–0.5)
HEMATOCRIT: 35 % — AB (ref 38.4–49.9)
Hemoglobin: 11.9 g/dL — ABNORMAL LOW (ref 13.0–17.1)
LYMPHS PCT: 17 %
Lymphs Abs: 1.1 10*3/uL (ref 0.9–3.3)
MCH: 31.1 pg (ref 27.2–33.4)
MCHC: 34 g/dL (ref 32.0–36.0)
MCV: 91.5 fL (ref 79.3–98.0)
MONO ABS: 0.4 10*3/uL (ref 0.1–0.9)
MONOS PCT: 6 %
NEUTROS ABS: 4.4 10*3/uL (ref 1.5–6.5)
Neutrophils Relative %: 74 %
Platelet Count: 169 10*3/uL (ref 140–400)
RBC: 3.83 MIL/uL — ABNORMAL LOW (ref 4.20–5.82)
RDW: 15.9 % — AB (ref 11.0–14.6)
WBC Count: 6 10*3/uL (ref 4.0–10.3)

## 2017-10-26 LAB — CMP (CANCER CENTER ONLY)
ALBUMIN: 3.8 g/dL (ref 3.5–5.0)
ALK PHOS: 81 U/L (ref 40–150)
ALT: 11 U/L (ref 0–55)
AST: 21 U/L (ref 5–34)
Anion gap: 8 (ref 3–11)
BILIRUBIN TOTAL: 0.4 mg/dL (ref 0.2–1.2)
BUN: 18 mg/dL (ref 7–26)
CALCIUM: 9.4 mg/dL (ref 8.4–10.4)
CO2: 25 mmol/L (ref 22–29)
Chloride: 104 mmol/L (ref 98–109)
Creatinine: 1.53 mg/dL — ABNORMAL HIGH (ref 0.70–1.30)
GFR, Est AFR Am: 47 mL/min — ABNORMAL LOW (ref >60)
GFR, Estimated: 40 mL/min — ABNORMAL LOW (ref >60)
GLUCOSE: 105 mg/dL (ref 70–140)
POTASSIUM: 4.4 mmol/L (ref 3.5–5.1)
Sodium: 137 mmol/L (ref 136–145)
TOTAL PROTEIN: 6.9 g/dL (ref 6.4–8.3)

## 2017-10-27 LAB — PROSTATE-SPECIFIC AG, SERUM (LABCORP): Prostate Specific Ag, Serum: 73.8 ng/mL — ABNORMAL HIGH (ref 0.0–4.0)

## 2017-10-29 ENCOUNTER — Telehealth: Payer: Self-pay

## 2017-10-29 ENCOUNTER — Other Ambulatory Visit: Payer: Self-pay | Admitting: *Deleted

## 2017-10-29 ENCOUNTER — Inpatient Hospital Stay (HOSPITAL_BASED_OUTPATIENT_CLINIC_OR_DEPARTMENT_OTHER): Payer: Medicare Other | Admitting: Oncology

## 2017-10-29 ENCOUNTER — Telehealth: Payer: Self-pay | Admitting: Pharmacy Technician

## 2017-10-29 VITALS — BP 149/86 | HR 70 | Temp 98.2°F | Resp 18 | Ht 71.0 in | Wt 207.4 lb

## 2017-10-29 DIAGNOSIS — C61 Malignant neoplasm of prostate: Secondary | ICD-10-CM | POA: Diagnosis not present

## 2017-10-29 DIAGNOSIS — C7951 Secondary malignant neoplasm of bone: Secondary | ICD-10-CM

## 2017-10-29 DIAGNOSIS — R5382 Chronic fatigue, unspecified: Secondary | ICD-10-CM | POA: Diagnosis not present

## 2017-10-29 DIAGNOSIS — R110 Nausea: Secondary | ICD-10-CM | POA: Diagnosis not present

## 2017-10-29 DIAGNOSIS — E291 Testicular hypofunction: Secondary | ICD-10-CM

## 2017-10-29 MED ORDER — PREDNISONE 5 MG PO TABS: 5.0000 mg | ORAL_TABLET | Freq: Every day | ORAL | 3 refills | Status: DC

## 2017-10-29 MED ORDER — PROMETHAZINE HCL 12.5 MG PO TABS: 12.5000 mg | ORAL_TABLET | Freq: Four times a day (QID) | ORAL | 2 refills | Status: DC | PRN

## 2017-10-29 MED ORDER — ABIRATERONE ACETATE 250 MG PO TABS: 1000.0000 mg | ORAL_TABLET | Freq: Every day | ORAL | 0 refills | Status: DC

## 2017-10-29 NOTE — Telephone Encounter (Signed)
Printed avs and calender of upcoming appointment. Per 5/17 los 

## 2017-10-29 NOTE — Progress Notes (Signed)
Hematology and Oncology Follow Up Visit  Ilan Kahrs [id number] 1932/04/09 84 y.o. 10/29/2017 12:00 PM [name], MDPerini, [name], MD   Principle Diagnosis: 84 year old man with castration-resistant prostate cancer.  He was initially diagnosed with Gleason score 4+3 = 7 and a PSA for in 2007.  He has documented bone disease.   Prior Therapy:  He received a definitive therapy with radiation therapy and developed biochemical relapse initially treated with androgen deprivation.  He developed bony metastasis including a frontal bone metastasis and was treated with radiation therapy in February of 2013. PSA was up to 13.38 despite castration levels of testosterone in 2015 indicating castration resistant disease.  Current therapy: Xtandi 160 mg daily started in August of 2015.  Interim History: Mr. Gopal resents today for a follow-up visit with his family.  Since the last visit, he reports no major changes in his health.  He does report chronic fatigue associated with [name] which has not dramatically changed.  He denies any bone pain or pathological fractures.  He denies any decline in his performance status.  He denies any confusion, falls or syncope.  His appetite and overall quality of life is not changed dramatically.  He does report occasional nausea which she takes Phenergan for.  He does not report any headaches or blurry vision or double vision.  He does not report any fevers, chills.  He does report slight weight loss.  Does not report any chest pain, potation, orthopnea or leg edema.  He does not report any cough, wheezing or hemoptysis.He does not report any  vomiting, early satiety, constipation or diarrhea.  Does not report any frequency urgency or hesitancy.  He denies any arthralgias or myalgias. He does not report any rashes or lesions or petechiae.  Does not report any anxiety or depression.  Remainder of his review of systems is negative.   Medications:   Current Outpatient  Medications  Medication Sig Dispense Refill  . allopurinol (ZYLOPRIM) 300 MG tablet Take 300 mg by mouth daily.  3  . atorvastatin (LIPITOR) 20 MG tablet Take 20 mg by mouth daily.  4  . diphenhydrAMINE (BENADRYL) 25 MG tablet Take 25 mg by mouth every 6 (six) hours as needed.    . enzalutamide (XTANDI) 40 MG capsule Take 4 capsules (160mg ) by mouth once daily as directed by physician. 120 capsule 0  . LORazepam (ATIVAN) 1 MG tablet as directed.  1  . omeprazole (PRILOSEC) 20 MG capsule Take 20 mg by mouth daily.    . promethazine (PHENERGAN) 12.5 MG tablet Take 12.5 mg by mouth every 6 (six) hours as needed. for nausea  1  . simethicone (MYLICON) 80 MG chewable tablet Chew 80 mg by mouth every 6 (six) hours as needed.    . Tamsulosin HCl (FLOMAX) 0.4 MG CAPS Take by mouth.    . Vitamin D, Cholecalciferol, 400 UNITS CHEW Chew 800 Units by mouth daily.     No current facility-administered medications for this visit.      Allergies:  No Known Allergies  Past Medical History, Surgical history, Social history, and Family History were reviewed and updated.   Physical Exam: Blood pressure (!) 149/86, pulse 70, temperature 98.2 F (36.8 C), temperature source Oral, resp. rate 18, height 5\' 11"  (1.803 m), weight 207 lb 6.4 oz (94.1 kg), SpO2 100 %.   ECOG: 1 General appearance: Alert, awake gentleman without distress. Head: Atraumatic without abnormalities. Oropharynx: Without any thrush or ulcers. Eyes: Pupils are equal and round reactive  to light. Heart: Regular rate without any murmurs or gallops. Lung:chest to auscultation without any rhonchi, wheezes or dullness to percussion. Abdomen: Soft, without any rebound or guarding.  No shifting dullness or ascites. Musculoskeletal: Full range of motion noted in all extremities.  No clubbing or cyanosis. Skin:  No ecchymosis or petechiae. Neurological: No motor or sensory deficits.  Lab Results: Lab Results  Component Value Date   WBC  6.0 10/26/2017   HGB 11.9 (L) 10/26/2017   HCT 35.0 (L) 10/26/2017   MCV 91.5 10/26/2017   PLT 169 10/26/2017     Chemistry      Component Value Date/Time   NA 137 10/26/2017 1110   NA 136 04/28/2017 1123   K 4.4 10/26/2017 1110   K 4.6 04/28/2017 1123   CL 104 10/26/2017 1110   CO2 25 10/26/2017 1110   CO2 25 04/28/2017 1123   BUN 18 10/26/2017 1110   BUN 24.1 04/28/2017 1123   CREATININE 1.53 (H) 10/26/2017 1110   CREATININE 1.8 (H) 04/28/2017 1123      Component Value Date/Time   CALCIUM 9.4 10/26/2017 1110   CALCIUM 9.2 04/28/2017 1123   ALKPHOS 81 10/26/2017 1110   ALKPHOS 81 04/28/2017 1123   AST 21 10/26/2017 1110   AST 14 04/28/2017 1123   ALT 11 10/26/2017 1110   ALT 10 04/28/2017 1123   BILITOT 0.4 10/26/2017 1110   BILITOT 0.36 04/28/2017 1123     Results for YURI, FANA (MRN [id number]) as of 10/29/2017 12:24  Ref. Range 04/28/2017 11:23 08/11/2017 10:07 10/26/2017 11:10  Prostate Specific Ag, Serum Latest Ref Range: 0.0 - 4.0 ng/mL 24.1 (H) 49.3 (H) 73.8 (H)     EXAM: NUCLEAR MEDICINE WHOLE BODY BONE SCAN  TECHNIQUE: Whole body anterior and posterior images were obtained approximately 3 hours after intravenous injection of radiopharmaceutical.  RADIOPHARMACEUTICALS:  20.7 mCi Technetium-45m MDP IV  COMPARISON:  09/23/2016  Radiographic correlation: CT abdomen pelvis 05/18/2017  FINDINGS: Multiple sites of abnormal osseous tracer accumulation are identified in the pelvis bilaterally, in the lumbar spine, LEFT scapula, at LEFT calvarial vertex, and sternum consistent with osseous metastatic disease.  These appear progressive since the previous exam.  Diffuse uptake throughout the mandible again identified, could be due to metastatic disease or Paget's disease.  Expected urinary tract and soft tissue distribution of tracer.  IMPRESSION: Progressive osseous metastatic disease since prior exam.    Impression and Plan:  84 year old  with:  1. Castration-resistant prostate cancer with metastatic disease with disease to the bone.     He is currently on Xtandi and has been since 2015 with excellent initial response.  His PSA is rising rapidly with a doubling time of less than 6 months.  Bone scan obtained in May 2019 was personally reviewed and imaging was reviewed and shown to the patient and his family.  He has clearly progressed on Xtandi with worsening bony metastasis and a rise in his PSA.  I have instructed him to stop Xtandi immediately.  Options of therapy were reviewed today in detail with the patient and his family.  That would include switching to Virgel Bouquet or systemic chemotherapy.  Risks and benefits of all these options were reviewed today.  Complications associated with all these regimens as well as potential benefits were reviewed individually.  After discussion today, he elected to try Zytiga at 1000 mg with prednisone initially.  Complications associated with this medication include nausea, fatigue, hypertension, edema among others.  We will  also repeat imaging studies of the abdomen to rule out any visceral metastasis.  If his disease remains in the bone only then Trudi Ida would be an alternative or in addition to [place].  He is in agreement with this plan and all his questions were answered.  2. Androgen depravation:.  He receives that under the care of Dr. Diona Fanti.  No complication noted at this time.  I recommended continuing this indefinitely.  3. Bone directed therapy: He is receiving Xgeva under the care of Dr. Diona Fanti.  I recommended continuing this given his worsening bony disease.  4. Followup: Will be in June 2019 to follow his progress.  25 minutes was spent with the patient face-to-face today.  More than 50% of time was dedicated to patient counseling, education and answering questions regarding diagnosis, prognosis and future plan of care.  Zola Button, MD 5/17/201912:00 PM

## 2017-10-29 NOTE — Telephone Encounter (Signed)
Oral Oncology Patient Advocate Encounter  Received notification from [facility] that prior authorization for Eric Deleon is required.  PA submitted on CoverMyMeds Key [id number] Status is pending  Oral Oncology Clinic will continue to follow.  Eric Deleon. Melynda Keller, [place] Patient [place] [phone] 10/29/2017 2:14 PM

## 2017-11-01 ENCOUNTER — Telehealth: Payer: Self-pay | Admitting: Pharmacy Technician

## 2017-11-01 NOTE — Telephone Encounter (Signed)
Oral Oncology Patient Advocate Encounter  Was successful in securing patient an $ 6500 grant from Patient [place] (PAF) to provide copayment coverage for his Zytiga.  This will keep the out of pocket expense at $0.    I have spoken with the patient.    The billing information is as follows and has been shared with [place].   Member ID: [id number]  Group ID: [id number] RxBin: [id number] Dates of Eligibility: 05/05/2017 through 11/02/2018  Eric Deleon. Eric Deleon, Eric Deleon Patient Eric Deleon [phone] 11/01/2017 3:51 PM

## 2017-11-02 NOTE — Telephone Encounter (Signed)
Oral Oncology Patient Advocate Encounter  Prior Authorization for Eric Deleon has been approved.    PA# [id number] Effective dates: 10/29/2017 through 10/30/2018  Oral Oncology Clinic will continue to follow.   Eric Deleon. Melynda Keller, [place] Patient [name] [phone] 11/02/2017 8:16 AM

## 2017-11-03 ENCOUNTER — Other Ambulatory Visit: Payer: Self-pay | Admitting: *Deleted

## 2017-11-03 ENCOUNTER — Telehealth: Payer: Self-pay | Admitting: *Deleted

## 2017-11-03 ENCOUNTER — Telehealth: Payer: Self-pay | Admitting: Pharmacist

## 2017-11-03 DIAGNOSIS — C61 Malignant neoplasm of prostate: Secondary | ICD-10-CM

## 2017-11-03 MED ORDER — ABIRATERONE ACETATE 250 MG PO TABS: 1000.0000 mg | ORAL_TABLET | Freq: Every day | ORAL | 0 refills | Status: DC

## 2017-11-03 MED ORDER — TRAMADOL HCL 50 MG PO TABS: 50.0000 mg | ORAL_TABLET | Freq: Four times a day (QID) | ORAL | 0 refills | Status: DC | PRN

## 2017-11-03 MED FILL — ABIRATERONE ACETATE 250 MG: 250 | 30 days supply | Qty: 120 | Fill #0

## 2017-11-03 NOTE — Telephone Encounter (Signed)
Ultram 50 6hr PRN. #[id number]

## 2017-11-03 NOTE — Telephone Encounter (Signed)
Oral Chemotherapy Pharmacist Encounter   I spoke with patient's wife, Pamala Hurry, for overview of: Zytiga.   Counseled on administration, dosing, side effects, monitoring, drug-food interactions, safe handling, storage, and disposal.  Patient will take Zytiga 250mg  tablets, 4 tablets (1000mg ) by mouth once daily on an empty stomach, 1 hour before or 2 hours after a meal.  Patient will take prednisone 5mg  tablet, 1 tablet by mouth one daily with breakfast.  Zytiga start date: 11/05/17 or 11/06/17  Adverse effects include but are not limited to: peripheral edema, GI upset, hypertension, hot flashes, fatigue, and arthralgias.    Prednisone prescription has been sent to [facility] in [place]. They have obtained prednisone and know to start prednisone on the same day as Zytiga start.  Reviewed importance of keeping a medication schedule and plan for any missed doses. We extensively reviewed administration differences with Xtandi.  Mrs. Bills voiced understanding and appreciation.   All questions answered. Medication reconciliation performed and medication/allergy list updated.  They will pick up the Zytiga from the [facility] on Friday 5/24 when they are at [facility] for an appointment. Patient plans to start Zytiga that evening or the next morning.  I confirmed office visits on 12/09/17 with Pamala Hurry.  They know to call the office with questions or concerns.  Oral Oncology Clinic will continue to follow.  Thank you,  Johny Drilling, PharmD, BCPS, BCOP  11/03/2017 2:02 PM Oral Oncology Clinic [phone]

## 2017-11-03 NOTE — Telephone Encounter (Signed)
Ultram 50 mg tablets called ion to [facility] in [place], wife [place] notified.

## 2017-11-03 NOTE — Telephone Encounter (Signed)
Patient c/o  Increased pain left hip and knee, right shoulder, unable to rest last night, aleve nolonger helping.

## 2017-11-03 NOTE — Telephone Encounter (Signed)
Mrs Rosenbloom states Mr Ciresi is having a lot of pain in shoulder, back, knee and especially hip. States he "down played the pain" when he saw Dr Alen Blew. He is taking Aleve at this time, does not need anything "too strong, just something to ease the pain".  [facility] in [place]

## 2017-11-03 NOTE — Telephone Encounter (Signed)
Oral Oncology Pharmacist Encounter  Received new prescription for Zytiga (abiraterone) for the treatment of metastatic, castration resistant prostate cancer that has progressed on therapy with Xtandi, in conjunction with oral prednisone, planned duration until disease progression or unacceptable toxicity.  Labs from 10/26/2017 assessed Noted SCr=1.53, est CrCl 40-45 mL/min as patient's baseline No dose adjustments provided by manufacturer for renal dysfunction  BPs in Epic reviewed, most readings slightly in hypertensive range No anti-hypertensive medications noted on patient's medication list This will continue to be monitored.  Current medication list in Epic reviewed, no significant DDIs with Zytiga identified.  Prescription has been e-scribed to the [facility] for benefits analysis and approval.  Insurance authorization has been approved  Oral oncology patient advocate has secured $6500 copayment assistance grants from the Patient [place] in order to reduce patient's out-of-pocket expense for Zytiga to $0  Oral Oncology Clinic will continue to follow for initial counseling and start date.  Johny Drilling, PharmD, BCPS, BCOP  11/03/2017 11:46 AM Oral Oncology Clinic [phone]

## 2017-11-05 ENCOUNTER — Telehealth: Payer: Self-pay | Admitting: *Deleted

## 2017-11-05 DIAGNOSIS — C775 Secondary and unspecified malignant neoplasm of intrapelvic lymph nodes: Secondary | ICD-10-CM | POA: Diagnosis not present

## 2017-11-05 DIAGNOSIS — C61 Malignant neoplasm of prostate: Secondary | ICD-10-CM | POA: Diagnosis not present

## 2017-11-05 DIAGNOSIS — C7951 Secondary malignant neoplasm of bone: Secondary | ICD-10-CM | POA: Diagnosis not present

## 2017-11-05 NOTE — Telephone Encounter (Signed)
"  Wife Pamala Hurry calling as I'm confused about when Jeramey is to begin the Alvin we picked up today.  The botle also has a different name on it.  I believe I heard two different directions.  Want to make sure the [name] is out his system."  Reviewed generic name, take prednisone daily, take Zytiga on an empty stomach one hour before or two hours after a meal and to begin this evening or in morning per education notes. "He'll start taking this in the morning."

## 2017-11-09 ENCOUNTER — Telehealth: Payer: Self-pay | Admitting: *Deleted

## 2017-11-09 NOTE — Telephone Encounter (Signed)
Reviewed medications and schedule with wife regarding prednisone, Zytiga and Ultram. Per Dr. Alen Blew, go ahead and start taking the Zytiga and Prednisone. Per wife, last dose of Xtandi was 10/28/17. Instructed her that the drug was out of his system. Instructed her that he can take Ultram every six hours as needed for back pain. She stated,"he took one today." Reviewed how to take Zytiga and Prednisone. She verbalized understanding of directions.

## 2017-11-12 ENCOUNTER — Ambulatory Visit (HOSPITAL_COMMUNITY)
Admission: RE | Admit: 2017-11-12 | Discharge: 2017-11-12 | Disposition: A | Discharge status: Home / Self Care | Payer: Medicare Other | Source: Ambulatory Visit | Attending: Oncology | Admitting: Oncology

## 2017-11-12 DIAGNOSIS — Z8546 Personal history of malignant neoplasm of prostate: Secondary | ICD-10-CM | POA: Diagnosis not present

## 2017-11-12 DIAGNOSIS — C775 Secondary and unspecified malignant neoplasm of intrapelvic lymph nodes: Secondary | ICD-10-CM | POA: Insufficient documentation

## 2017-11-12 DIAGNOSIS — K5730 Diverticulosis of large intestine without perforation or abscess without bleeding: Secondary | ICD-10-CM | POA: Diagnosis not present

## 2017-11-12 DIAGNOSIS — C61 Malignant neoplasm of prostate: Secondary | ICD-10-CM | POA: Diagnosis not present

## 2017-11-12 DIAGNOSIS — I700 Atherosclerosis of aorta: Secondary | ICD-10-CM | POA: Insufficient documentation

## 2017-11-12 DIAGNOSIS — I2510 Atherosclerotic heart disease of native coronary artery without angina pectoris: Secondary | ICD-10-CM | POA: Insufficient documentation

## 2017-11-12 DIAGNOSIS — C7951 Secondary malignant neoplasm of bone: Secondary | ICD-10-CM | POA: Diagnosis not present

## 2017-11-15 ENCOUNTER — Telehealth: Payer: Self-pay | Admitting: *Deleted

## 2017-11-15 NOTE — Telephone Encounter (Signed)
rec'd call from patient's daughter, terry she States patient has been declining, states he is in increased pain and tramadol is no longer effective. Concerned about recent scans and they don't think patient can wait until June 27th to see dr Alen Blew at regularly scheduled appt. Sandi Mealy to see patient tomorrow in [facility], [place] @ 1:00 pm.

## 2017-11-16 ENCOUNTER — Other Ambulatory Visit: Payer: Self-pay | Admitting: Oncology

## 2017-11-16 ENCOUNTER — Inpatient Hospital Stay: Payer: Medicare Other | Attending: Oncology | Admitting: Medical

## 2017-11-16 VITALS — BP 124/88 | HR 85 | Temp 98.3°F | Resp 18

## 2017-11-16 DIAGNOSIS — G893 Neoplasm related pain (acute) (chronic): Secondary | ICD-10-CM

## 2017-11-16 DIAGNOSIS — E291 Testicular hypofunction: Secondary | ICD-10-CM | POA: Diagnosis not present

## 2017-11-16 DIAGNOSIS — C7951 Secondary malignant neoplasm of bone: Secondary | ICD-10-CM | POA: Insufficient documentation

## 2017-11-16 DIAGNOSIS — R630 Anorexia: Secondary | ICD-10-CM | POA: Insufficient documentation

## 2017-11-16 DIAGNOSIS — C61 Malignant neoplasm of prostate: Secondary | ICD-10-CM | POA: Diagnosis not present

## 2017-11-16 DIAGNOSIS — R0602 Shortness of breath: Secondary | ICD-10-CM | POA: Diagnosis not present

## 2017-11-16 MED ORDER — MIRTAZAPINE 7.5 MG PO TABS: 7.5000 mg | ORAL_TABLET | Freq: Every day | ORAL | 3 refills | Status: DC

## 2017-11-16 MED ORDER — HYDROCODONE-ACETAMINOPHEN 5-325 MG PO TABS: 1.0000 | ORAL_TABLET | Freq: Four times a day (QID) | ORAL | 0 refills | Status: DC | PRN

## 2017-11-16 NOTE — Patient Instructions (Addendum)
  Senna-S, 1 one to two times daily as needed for constipation       Fatigue Fatigue is feeling tired all of the time, a lack of energy, or a lack of motivation. Occasional or mild fatigue is often a normal response to activity or life in general. However, long-lasting (chronic) or extreme fatigue may indicate an underlying medical condition. Follow these instructions at home: Watch your fatigue for any changes. The following actions may help to lessen any discomfort you are feeling:  Talk to your health care provider about how much sleep you need each night. Try to get the required amount every night.  Take medicines only as directed by your health care provider.  Eat a healthy and nutritious diet. Ask your health care provider if you need help changing your diet.  Drink enough fluid to keep your urine clear or pale yellow.  Practice ways of relaxing, such as yoga, meditation, massage therapy, or acupuncture.  Exercise regularly.  Change situations that cause you stress. Try to keep your work and personal routine reasonable.  Do not abuse illegal drugs.  Limit alcohol intake to no more than 1 drink per day for nonpregnant women and 2 drinks per day for men. One drink equals 12 ounces of beer, 5 ounces of wine, or 1 ounces of hard liquor.  Take a multivitamin, if directed by your health care provider.  Contact a health care provider if:  Your fatigue does not get better.  You have a fever.  You have unintentional weight loss or gain.  You have headaches.  You have difficulty: ? Falling asleep. ? Sleeping throughout the night.  You feel angry, guilty, anxious, or sad.  You are unable to have a bowel movement (constipation).  You skin is dry.  Your legs or another part of your body is swollen. Get help right away if:  You feel confused.  Your vision is blurry.  You feel faint or pass out.  You have a severe headache.  You have severe abdominal, pelvic, or  back pain.  You have chest pain, shortness of breath, or an irregular or fast heartbeat.  You are unable to urinate or you urinate less than normal.  You develop abnormal bleeding, such as bleeding from the rectum, vagina, nose, lungs, or nipples.  You vomit blood.  You have thoughts about harming yourself or committing suicide.  You are worried that you might harm someone else. This information is not intended to replace advice given to you by your health care provider. Make sure you discuss any questions you have with your health care provider. Document Released: 03/29/2007 Document Revised: 11/07/2015 Document Reviewed: 10/03/2013 Elsevier Interactive Patient Education  [employer].

## 2017-11-17 NOTE — Progress Notes (Signed)
[facility] Progress Note   Eric Deleon [id number] April 03, 1932 84 y.o.  Eric Deleon is managed by Dr. Alen Blew  Actively treated with chemotherapy/immunotherapy: yes  Current Therapy: Zytiga  Last Treated: 11/16/2017  Assessment: Plan:    Anorexia - Plan: mirtazapine (REMERON) 7.5 MG tablet  Neoplasm related pain - Plan: HYDROcodone-acetaminophen (NORCO) 5-325 MG tablet   Anorexia: The patient was given a prescription for mirtazapine 7.5 mg p.o. Nightly.  Neoplasm related pain: The patient was given a prescription for Norco 5-3 25, 1-2 every 6 hours as needed for pain.  Please see After Visit Summary for patient specific instructions.  Future Appointments  Date Time Provider [place]  12/09/2017  1:15 PM [facility] 4 [facility] None  12/09/2017  1:45 PM Shadad, Mathis Dad, MD [facility] None    No orders of the defined types were placed in this encounter.      Subjective:   Patient ID:  Eric Deleon is a 84 y.o. (DOB [birth year]) male.  Chief Complaint: No chief complaint on file.   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  He is followed by Dr. Alen Blew and was previously treated with [name] which was recently discontinued.  He has been transition to [place].  He had an approximately 5-Day gap between stopping  [place] and beginning [place].  He has been taking tramadol for his pain but has been having to take it around every 3 hours without good pain resolution.  He reports increasing fatigue, lower back pain, hip pain, and shoulder pain.  He additionally has been having shortness of breath, nausea, anorexia, and inability to do most tasks due to his symptoms.  He presents to the office today with his wife and daughter.  Medications: I have reviewed the patient's current medications.  Allergies: No Known Allergies  Past Medical History:  Diagnosis Date   . Allergy    allergic rhinitis  . Arthritis    finger  . BPH (benign prostatic hyperplasia)   . Cancer    "skin cancer"  . CRI (chronic renal insufficiency)    mild asymptomatic for years  . Erectile dysfunction   . GERD (gastroesophageal reflux disease)   . Gout   . History of pneumonia    when in the [employer]  . IFG (impaired fasting glucose)   . Prostate cancer   . Skin cancer    non melanoma    Past Surgical History:  Procedure Laterality Date  . history of radiation therapy    . skin cancer excised     Multiple skin cancers excised from hands and back    Family History  Problem Relation Age of Onset  . COPD Mother   . Heart disease Mother   . Cancer Father        prostate  . Heart attack Father   . Cancer Brother        prostate    Social History   Socioeconomic History  . Marital status: Married    Spouse name: Not on file  . Number of children: Not on file  . Years of education: Not on file  . Highest education level: Not on file  Occupational History  . Not on file  Social Needs  . Financial resource strain: Not on file  . Food insecurity:    Worry: Not on file    Inability: Not on file  . Transportation needs:  Medical: Not on file    Non-medical: Not on file  Tobacco Use  . Smoking status: Former Smoker    Packs/day: 0.50    Years: 15.00    Pack years: 7.50    Types: Cigarettes    Last attempt to quit: 06/30/1960    Years since quitting: 57.4  . Smokeless tobacco: Never Used  Substance and Sexual Activity  . Alcohol use: Yes    Comment: occasionally  red wine  and beer   . Drug use: No  . Sexual activity: Not on file  Lifestyle  . Physical activity:    Days per week: Not on file    Minutes per session: Not on file  . Stress: Not on file  Relationships  . Social connections:    Talks on phone: Not on file    Gets together: Not on file    Attends religious service: Not on file    Active member of club or organization: Not on  file    Attends meetings of clubs or organizations: Not on file    Relationship status: Not on file  . Intimate partner violence:    Fear of current or ex partner: Not on file    Emotionally abused: Not on file    Physically abused: Not on file    Forced sexual activity: Not on file  Other Topics Concern  . Not on file  Social History Narrative  . Not on file    Past Medical History, Surgical history, Social history, and Family history were reviewed and updated as appropriate.   Please see review of systems for further details on the patient's review from today.   Review of Systems:  Review of Systems  Constitutional: Positive for activity change, appetite change and fatigue. Negative for chills, diaphoresis and fever.  HENT: Negative for trouble swallowing.   Respiratory: Positive for shortness of breath. Negative for cough, choking and chest tightness.   Cardiovascular: Negative for chest pain and leg swelling.  Gastrointestinal: Positive for nausea. Negative for constipation, diarrhea and vomiting.  Musculoskeletal: Positive for arthralgias and back pain.  Neurological: Positive for weakness.    Objective:   Physical Exam:  BP 124/88 (BP Location: Left Arm, Patient Position: Sitting)   Pulse 85   Temp 98.3 F (36.8 C) (Oral)   Resp 18   SpO2 100%  ECOG: 1  Physical Exam  Constitutional: No distress.  HENT:  Head: Normocephalic and atraumatic.  Cardiovascular: Normal rate, regular rhythm and normal heart sounds. Exam reveals no gallop and no friction rub.  No murmur heard. Pulmonary/Chest: Effort normal and breath sounds normal. No respiratory distress. He has no wheezes. He has no rales.  Neurological: He is alert. Coordination normal.  Skin: Skin is warm and dry. No rash noted. He is not diaphoretic. No erythema.  Psychiatric: He has a normal mood and affect. His behavior is normal. Judgment and thought content normal.    Lab Review:     Component Value  Date/Time   NA 137 10/26/2017 1110   NA 136 04/28/2017 1123   K 4.4 10/26/2017 1110   K 4.6 04/28/2017 1123   CL 104 10/26/2017 1110   CO2 25 10/26/2017 1110   CO2 25 04/28/2017 1123   GLUCOSE 105 10/26/2017 1110   GLUCOSE 93 04/28/2017 1123   BUN 18 10/26/2017 1110   BUN 24.1 04/28/2017 1123   CREATININE 1.53 (H) 10/26/2017 1110   CREATININE 1.8 (H) 04/28/2017 1123   CALCIUM 9.4 10/26/2017  1110   CALCIUM 9.2 04/28/2017 1123   PROT 6.9 10/26/2017 1110   PROT 7.1 04/28/2017 1123   ALBUMIN 3.8 10/26/2017 1110   ALBUMIN 3.5 04/28/2017 1123   AST 21 10/26/2017 1110   AST 14 04/28/2017 1123   ALT 11 10/26/2017 1110   ALT 10 04/28/2017 1123   ALKPHOS 81 10/26/2017 1110   ALKPHOS 81 04/28/2017 1123   BILITOT 0.4 10/26/2017 1110   BILITOT 0.36 04/28/2017 1123   GFRNONAA 40 (L) 10/26/2017 1110   GFRAA 47 (L) 10/26/2017 1110       Component Value Date/Time   WBC 6.0 10/26/2017 1110   WBC 5.9 08/11/2017 1007   RBC 3.83 (L) 10/26/2017 1110   HGB 11.9 (L) 10/26/2017 1110   HGB 12.0 (L) 04/28/2017 1123   HCT 35.0 (L) 10/26/2017 1110   HCT 36.4 (L) 04/28/2017 1123   PLT 169 10/26/2017 1110   PLT 200 04/28/2017 1123   MCV 91.5 10/26/2017 1110   MCV 91.6 04/28/2017 1123   MCH 31.1 10/26/2017 1110   MCHC 34.0 10/26/2017 1110   RDW 15.9 (H) 10/26/2017 1110   RDW 15.7 (H) 04/28/2017 1123   LYMPHSABS 1.1 10/26/2017 1110   LYMPHSABS 1.3 04/28/2017 1123   MONOABS 0.4 10/26/2017 1110   MONOABS 0.4 04/28/2017 1123   EOSABS 0.1 10/26/2017 1110   EOSABS 0.1 04/28/2017 1123   BASOSABS 0.1 10/26/2017 1110   BASOSABS 0.0 04/28/2017 1123   -------------------------------  Imaging from last 24 hours (if applicable):  Radiology interpretation: Ct Abdomen Pelvis Wo Contrast  Result Date: 11/12/2017 CLINICAL DATA:  84 year old male with history of castration resistant prostate cancer diagnosed in 2007. Documented metastatic disease to the bones. Follow-up study. EXAM: CT ABDOMEN AND  PELVIS WITHOUT CONTRAST TECHNIQUE: Multidetector CT imaging of the abdomen and pelvis was performed following the standard protocol without IV contrast. COMPARISON:  CT the abdomen and pelvis 05/18/2017. FINDINGS: Lower chest: Mild scarring in the left lower lobe. Mild pleural thickening in the left base. Atherosclerotic calcifications in the right coronary artery. Calcifications of the mitral annulus. Hepatobiliary: No definite cystic or solid hepatic lesions are confidently identified on today's noncontrast CT examination. Unenhanced appearance of the gallbladder is normal. Pancreas: No definite pancreatic mass or peripancreatic fluid or inflammatory changes are noted on today's noncontrast CT examination. Spleen: Unremarkable. Adrenals/Urinary Tract: Multiple low-attenuation lesions associated with both kidneys, incompletely characterized on today's noncontrast CT examination, but similar to prior studies, likely cysts, with the largest of these extending exophytically from the lower pole of the left kidney measuring up to 11 cm in diameter. No hydroureteronephrosis. Urinary bladder is unremarkable in appearance. Bilateral adrenal glands are normal in appearance. Stomach/Bowel: Unenhanced appearance of the stomach is normal. No pathologic dilatation of small bowel or colon. Numerous colonic diverticulae are noted, without surrounding inflammatory changes to suggest an acute diverticulitis at this time. Normal appendix. Vascular/Lymphatic: Aortic atherosclerosis. Previously noted lymphadenopathy has significantly worsened. Specific examples include an enlarging right common iliac lymph node measuring 3.2 cm in short axis (axial image 63 of series 2) which previously measured only 2.4 cm, and worsening left para-aortic lymphadenopathy with a large nodal mass measuring approximately 5.2 x 6.7 cm (axial image 40 of series 2), previously only 4.1 x 4.2 cm. Reproductive: Brachytherapy implants throughout the prostate  gland. Other: No significant volume of ascites.  No pneumoperitoneum. Musculoskeletal: Numerous sclerotic lesions are again noted throughout the axial and appendicular skeleton, compatible with widespread metastatic disease to the bones. This appears essentially  unchanged compared to the prior examination. IMPRESSION: 1. While the skeletal metastases appear very similar to the prior study, there has been clear progression of nodal metastatic disease in the pelvis and retroperitoneum, as discussed above. 2. Aortic atherosclerosis, in addition to at least right coronary artery disease. 3. Colonic diverticulosis without evidence of acute diverticulitis at this time. Aortic Atherosclerosis (ICD10-I70.0). Electronically Signed   By: Vinnie Langton M.D.   On: 11/12/2017 15:00   Nm Bone Scan Whole Body  Result Date: 10/26/2017 CLINICAL DATA:  Prostate cancer EXAM: NUCLEAR MEDICINE WHOLE BODY BONE SCAN TECHNIQUE: Whole body anterior and posterior images were obtained approximately 3 hours after intravenous injection of radiopharmaceutical. RADIOPHARMACEUTICALS:  20.7 mCi Technetium-34m MDP IV COMPARISON:  09/23/2016 Radiographic correlation: CT abdomen pelvis 05/18/2017 FINDINGS: Multiple sites of abnormal osseous tracer accumulation are identified in the pelvis bilaterally, in the lumbar spine, LEFT scapula, at LEFT calvarial vertex, and sternum consistent with osseous metastatic disease. These appear progressive since the previous exam. Diffuse uptake throughout the mandible again identified, could be due to metastatic disease or Paget's disease. Expected urinary tract and soft tissue distribution of tracer. IMPRESSION: Progressive osseous metastatic disease since prior exam. Electronically Signed   By: Lavonia Dana M.D.   On: 10/26/2017 19:54

## 2017-11-26 ENCOUNTER — Other Ambulatory Visit: Payer: Self-pay | Admitting: Oncology

## 2017-11-26 ENCOUNTER — Telehealth: Payer: Self-pay | Admitting: *Deleted

## 2017-11-26 DIAGNOSIS — C61 Malignant neoplasm of prostate: Secondary | ICD-10-CM

## 2017-11-26 DIAGNOSIS — G893 Neoplasm related pain (acute) (chronic): Secondary | ICD-10-CM

## 2017-11-26 MED ORDER — HYDROCODONE-ACETAMINOPHEN 5-325 MG PO TABS: 1.0000 | ORAL_TABLET | Freq: Four times a day (QID) | ORAL | 0 refills | Status: DC | PRN

## 2017-11-26 NOTE — Telephone Encounter (Signed)
Returned [employer], phone call but she was not available. Asked her to call [phone] and tell the office what she needed.

## 2017-12-01 MED FILL — ABIRATERONE ACETATE 250 MG: 250 | 30 days supply | Qty: 120 | Fill #0

## 2017-12-07 DIAGNOSIS — C7951 Secondary malignant neoplasm of bone: Secondary | ICD-10-CM | POA: Diagnosis not present

## 2017-12-07 DIAGNOSIS — C61 Malignant neoplasm of prostate: Secondary | ICD-10-CM | POA: Diagnosis not present

## 2017-12-09 ENCOUNTER — Inpatient Hospital Stay (HOSPITAL_BASED_OUTPATIENT_CLINIC_OR_DEPARTMENT_OTHER): Payer: Medicare Other | Admitting: Oncology

## 2017-12-09 ENCOUNTER — Telehealth: Payer: Self-pay | Admitting: Oncology

## 2017-12-09 ENCOUNTER — Inpatient Hospital Stay: Payer: Medicare Other

## 2017-12-09 VITALS — BP 130/86 | HR 91 | Temp 98.2°F | Resp 17 | Ht 71.0 in | Wt 200.8 lb

## 2017-12-09 DIAGNOSIS — E291 Testicular hypofunction: Secondary | ICD-10-CM | POA: Diagnosis not present

## 2017-12-09 DIAGNOSIS — C61 Malignant neoplasm of prostate: Secondary | ICD-10-CM

## 2017-12-09 DIAGNOSIS — C775 Secondary and unspecified malignant neoplasm of intrapelvic lymph nodes: Secondary | ICD-10-CM

## 2017-12-09 DIAGNOSIS — G893 Neoplasm related pain (acute) (chronic): Secondary | ICD-10-CM

## 2017-12-09 DIAGNOSIS — R630 Anorexia: Secondary | ICD-10-CM | POA: Diagnosis not present

## 2017-12-09 DIAGNOSIS — C7951 Secondary malignant neoplasm of bone: Secondary | ICD-10-CM | POA: Diagnosis not present

## 2017-12-09 DIAGNOSIS — R0602 Shortness of breath: Secondary | ICD-10-CM | POA: Diagnosis not present

## 2017-12-09 LAB — CBC WITH DIFFERENTIAL (CANCER CENTER ONLY)
BASOS ABS: 0.1 10*3/uL (ref 0.0–0.1)
Basophils Relative: 1 %
EOS ABS: 0.1 10*3/uL (ref 0.0–0.5)
EOS PCT: 1 %
HCT: 34.6 % — ABNORMAL LOW (ref 38.4–49.9)
Hemoglobin: 11.5 g/dL — ABNORMAL LOW (ref 13.0–17.1)
LYMPHS ABS: 1.2 10*3/uL (ref 0.9–3.3)
LYMPHS PCT: 17 %
MCH: 30 pg (ref 27.2–33.4)
MCHC: 33.2 g/dL (ref 32.0–36.0)
MCV: 90.4 fL (ref 79.3–98.0)
Monocytes Absolute: 0.3 10*3/uL (ref 0.1–0.9)
Monocytes Relative: 5 %
Neutro Abs: 5.8 10*3/uL (ref 1.5–6.5)
Neutrophils Relative %: 76 %
PLATELETS: 187 10*3/uL (ref 140–400)
RBC: 3.82 MIL/uL — ABNORMAL LOW (ref 4.20–5.82)
RDW: 15.5 % — AB (ref 11.0–14.6)
WBC Count: 7.4 10*3/uL (ref 4.0–10.3)

## 2017-12-09 LAB — CMP (CANCER CENTER ONLY)
ALT: 11 U/L (ref 0–44)
AST: 17 U/L (ref 15–41)
Albumin: 3.4 g/dL — ABNORMAL LOW (ref 3.5–5.0)
Alkaline Phosphatase: 115 U/L (ref 38–126)
Anion gap: 7 (ref 5–15)
BUN: 21 mg/dL (ref 8–23)
CHLORIDE: 104 mmol/L (ref 98–111)
CO2: 27 mmol/L (ref 22–32)
Calcium: 9.6 mg/dL (ref 8.9–10.3)
Creatinine: 1.48 mg/dL — ABNORMAL HIGH (ref 0.61–1.24)
GFR, EST AFRICAN AMERICAN: 49 mL/min — AB (ref >60)
GFR, EST NON AFRICAN AMERICAN: 42 mL/min — AB (ref >60)
Glucose, Bld: 106 mg/dL — ABNORMAL HIGH (ref 70–99)
Potassium: 4.8 mmol/L (ref 3.5–5.1)
Sodium: 138 mmol/L (ref 135–145)
Total Bilirubin: 0.4 mg/dL (ref 0.3–1.2)
Total Protein: 6.6 g/dL (ref 6.5–8.1)

## 2017-12-09 NOTE — Progress Notes (Signed)
Hematology and Oncology Follow Up Visit  Eric Deleon [id number] [birth year] 84 y.o. 12/09/2017 1:56 PM Eric Deleon, MDPerini, [name], MD   Principle Diagnosis: 84 year old man with castration-resistant prostate cancer.  He was initially diagnosed with Gleason score 4+3 = 7 and a PSA for in 2007.  He has documented bone disease.   Prior Therapy:  He received a definitive therapy with radiation therapy and developed biochemical relapse initially treated with androgen deprivation.  He developed bony metastasis including a frontal bone metastasis and was treated with radiation therapy in February of 2013. PSA was up to 13.38 despite castration levels of testosterone in 2015 indicating castration resistant disease. Xtandi 160 mg daily started in August of 2015.  Therapy discontinued in May 2019.  Current therapy: Zytiga 1000 mg daily with prednisone at 5 mg daily.  Interim History: Mr. Eric Deleon is here for a follow-up visit.  Since the last visit, he discontinued Xtandi and switched to [place].  After stopping [name], he had issues with with increased diffuse body pain as well as nausea and decreased appetite.  And of lost close to 10 pounds.  After starting Zytiga, his symptoms have improved and have gained close to 5 pounds back.  He was started on hydrocodone which have helped his symptoms of pain reasonably well.  His pain has been manageable with using hydrocodone few times a day.  He is ambulating without any difficulties at this time although he does have issues of unsteadiness.  He does not report any headaches or blurry vision or double vision.  He denies any alteration in mental status or confusion.  He does not report any fevers, chills.  His appetite has improved.  Does not report any chest pain, potation, orthopnea or leg edema.  He does not report any cough, wheezing or hemoptysis.He does not report any  vomiting, early satiety, constipation or diarrhea.  Does not report any frequency urgency  or hesitancy.  He denies any pathological fractures.  He does not report any ecchymosis or bleeding.  Does not report any anxiety or depression.  Remainder of his review of systems is negative.   Medications:   Current Outpatient Medications  Medication Sig Dispense Refill  . abiraterone acetate (ZYTIGA) 250 MG tablet TAKE 4 TABLETS (1,000 MG TOTAL) BY MOUTH DAILY. TAKE ON AN EMPTY STOMACH 1 HOUR BEFORE OR 2 HOURS AFTER A MEAL 120 tablet 0  . allopurinol (ZYLOPRIM) 300 MG tablet Take 300 mg by mouth daily.  3  . atorvastatin (LIPITOR) 20 MG tablet Take 20 mg by mouth daily.  4  . diphenhydrAMINE (BENADRYL) 25 MG tablet Take 25 mg by mouth every 6 (six) hours as needed.    Marland Kitchen HYDROcodone-acetaminophen (NORCO) 5-325 MG tablet Take 1-2 tablets by mouth every 6 (six) hours as needed for moderate pain. 45 tablet 0  . LORazepam (ATIVAN) 1 MG tablet as directed.  1  . mirtazapine (REMERON) 7.5 MG tablet Take 1 tablet (7.5 mg total) by mouth at bedtime. 30 tablet 3  . omeprazole (PRILOSEC) 20 MG capsule Take 20 mg by mouth daily.    . predniSONE (DELTASONE) 5 MG tablet Take 1 tablet (5 mg total) by mouth daily with breakfast. 90 tablet 3  . promethazine (PHENERGAN) 12.5 MG tablet Take 1 tablet (12.5 mg total) by mouth every 6 (six) hours as needed. for nausea 30 tablet 2  . simethicone (MYLICON) 80 MG chewable tablet Chew 80 mg by mouth every 6 (six) hours as needed.    Marland Kitchen  Tamsulosin HCl (FLOMAX) 0.4 MG CAPS Take by mouth.    . traMADol (ULTRAM) 50 MG tablet Take 1 tablet (50 mg total) by mouth every 6 (six) hours as needed. 30 tablet 0  . Vitamin D, Cholecalciferol, 400 UNITS CHEW Chew 800 Units by mouth daily.     No current facility-administered medications for this visit.      Allergies:  No Known Allergies  Past Medical History, Surgical history, Social history, and Family History were reviewed and updated.   Physical Exam: Blood pressure 130/86, pulse 91, temperature 98.2 F (36.8 C),  temperature source Oral, resp. rate 17, height 5\' 11"  (1.803 m), weight 200 lb 12.8 oz (91.1 kg), SpO2 100 %.   ECOG: 1 General appearance: Well-appearing gentleman without distress. Head: Normocephalic without abnormalities. Oropharynx: Lucas membranes are moist and pink. Eyes: Sclera anicteric. Heart: Regular rate any murmurs.  S1 and S2 without leg edema. Lung: Clear without any rhonchi, wheezes or dullness to percussion. Abdomen: Soft, without any rebound or guarding.  No shifting dullness or ascites. Musculoskeletal: No clubbing or cyanosis. Skin: No petechia or ecchymosis. Neurological: No motor or sensory deficits.  Lab Results: Lab Results  Component Value Date   WBC 7.4 12/09/2017   HGB 11.5 (L) 12/09/2017   HCT 34.6 (L) 12/09/2017   MCV 90.4 12/09/2017   PLT 187 12/09/2017     Chemistry      Component Value Date/Time   NA 137 10/26/2017 1110   NA 136 04/28/2017 1123   K 4.4 10/26/2017 1110   K 4.6 04/28/2017 1123   CL 104 10/26/2017 1110   CO2 25 10/26/2017 1110   CO2 25 04/28/2017 1123   BUN 18 10/26/2017 1110   BUN 24.1 04/28/2017 1123   CREATININE 1.53 (H) 10/26/2017 1110   CREATININE 1.8 (H) 04/28/2017 1123      Component Value Date/Time   CALCIUM 9.4 10/26/2017 1110   CALCIUM 9.2 04/28/2017 1123   ALKPHOS 81 10/26/2017 1110   ALKPHOS 81 04/28/2017 1123   AST 21 10/26/2017 1110   AST 14 04/28/2017 1123   ALT 11 10/26/2017 1110   ALT 10 04/28/2017 1123   BILITOT 0.4 10/26/2017 1110   BILITOT 0.36 04/28/2017 1123     Results for VITOR, OVERBAUGH (MRN [id number]) as of 12/09/2017 13:03  Ref. Range 08/11/2017 10:07 10/26/2017 11:10  Prostate Specific Ag, Serum Latest Ref Range: 0.0 - 4.0 ng/mL 49.3 (H) 73.8 (H)        Impression and Plan:  84 year old with:  1. Castration-resistant prostate cancer with pelvic adenopathy and limited disease to the bone.  He has progressed on Xtandi and was switched to [place] as he declined chemotherapy.  Eric Deleon is  not an option given his disease is predominantly in pelvic lymph nodes.  He continues to tolerate Zytiga reasonably well at this time without complications.  Risks and benefits of continuing this medication were reviewed and he is agreeable to continue.  We will continue to check PSA periodically and consider different salvage therapy if his PSA continues to rise.  His best choice would be systemic chemotherapy at that time.    2. Androgen depravation: I recommended continuing this indefinitely at this time.  He receives that under the care of Dr. Diona Fanti.  3. Bone directed therapy: He continues to receive Xgeva under the care of Dr. Diona Fanti.  4.  Weight loss: This is improved with the help of Remeron and have gained at least 5 pounds.  His appetite is  improving.  5.  Pain: Related to his metastatic prostate cancer.  He is currently on hydrocodone as needed.  6. Followup: Will be in 1 month to follow his progress.  25 minutes was spent with the patient face-to-face today.  More than 50% of time was dedicated to patient counseling, education and discussing the natural course of this disease as well as future treatment options.  Zola Button, MD 6/27/20191:56 PM

## 2017-12-09 NOTE — Telephone Encounter (Signed)
Appointments scheduled AVS/Calendar printed per 6/27 los °

## 2017-12-10 ENCOUNTER — Telehealth: Payer: Self-pay | Admitting: *Deleted

## 2017-12-10 LAB — PROSTATE-SPECIFIC AG, SERUM (LABCORP): PROSTATE SPECIFIC AG, SERUM: 55.1 ng/mL — AB (ref 0.0–4.0)

## 2017-12-10 NOTE — Telephone Encounter (Signed)
As noted below by Dr. Alen Blew, I informed the wife of PSA results. She verbalized understanding.

## 2017-12-10 NOTE — Telephone Encounter (Signed)
-----   Message from Wyatt Portela, MD sent at 12/10/2017  6:58 AM EDT ----- Please let him know his PSA is down.

## 2017-12-17 ENCOUNTER — Other Ambulatory Visit: Payer: Self-pay | Admitting: *Deleted

## 2017-12-17 DIAGNOSIS — G893 Neoplasm related pain (acute) (chronic): Secondary | ICD-10-CM

## 2017-12-17 MED ORDER — HYDROCODONE-ACETAMINOPHEN 5-325 MG PO TABS: 1.0000 | ORAL_TABLET | Freq: Four times a day (QID) | ORAL | 0 refills | Status: DC | PRN

## 2017-12-22 ENCOUNTER — Other Ambulatory Visit: Payer: Self-pay | Admitting: Oncology

## 2017-12-22 DIAGNOSIS — C61 Malignant neoplasm of prostate: Secondary | ICD-10-CM

## 2017-12-27 MED FILL — ABIRATERONE ACETATE 250 MG: 250 | 30 days supply | Qty: 120 | Fill #0

## 2018-01-07 DIAGNOSIS — C61 Malignant neoplasm of prostate: Secondary | ICD-10-CM | POA: Diagnosis not present

## 2018-01-07 DIAGNOSIS — C7951 Secondary malignant neoplasm of bone: Secondary | ICD-10-CM | POA: Diagnosis not present

## 2018-01-13 ENCOUNTER — Inpatient Hospital Stay: Payer: Medicare Other | Attending: Oncology | Admitting: Oncology

## 2018-01-13 ENCOUNTER — Inpatient Hospital Stay: Payer: Medicare Other

## 2018-01-18 ENCOUNTER — Other Ambulatory Visit: Payer: Self-pay | Admitting: Oncology

## 2018-01-18 DIAGNOSIS — C61 Malignant neoplasm of prostate: Secondary | ICD-10-CM

## 2018-01-24 MED FILL — ABIRATERONE ACETATE 250 MG: 250 | 30 days supply | Qty: 120 | Fill #0

## 2018-01-28 ENCOUNTER — Other Ambulatory Visit: Payer: Self-pay | Admitting: Medical

## 2018-01-28 ENCOUNTER — Telehealth: Payer: Self-pay | Admitting: *Deleted

## 2018-01-28 DIAGNOSIS — G893 Neoplasm related pain (acute) (chronic): Secondary | ICD-10-CM

## 2018-01-28 DIAGNOSIS — R630 Anorexia: Secondary | ICD-10-CM

## 2018-01-28 MED ORDER — HYDROCODONE-ACETAMINOPHEN 5-325 MG PO TABS: 1.0000 | ORAL_TABLET | Freq: Four times a day (QID) | ORAL | 0 refills | Status: DC | PRN

## 2018-01-28 NOTE — Telephone Encounter (Signed)
Ok to refill 

## 2018-01-28 NOTE — Telephone Encounter (Signed)
Wife Pamala Hurry calling to request a refill on hydrocodone. States he is having pain in his lower back and left hip. Wife was not aware that they had missed last appt for lab/dr. shadad on 01/13/18. Will send los to schedulers for first available.

## 2018-01-31 ENCOUNTER — Telehealth: Payer: Self-pay | Admitting: Oncology

## 2018-01-31 NOTE — Telephone Encounter (Signed)
Spoke to patients wife regarding upcoming sept appts per 8/16 sch message

## 2018-02-09 DIAGNOSIS — C7951 Secondary malignant neoplasm of bone: Secondary | ICD-10-CM | POA: Diagnosis not present

## 2018-02-09 DIAGNOSIS — C61 Malignant neoplasm of prostate: Secondary | ICD-10-CM | POA: Diagnosis not present

## 2018-02-15 ENCOUNTER — Other Ambulatory Visit: Payer: Self-pay | Admitting: Oncology

## 2018-02-15 DIAGNOSIS — C61 Malignant neoplasm of prostate: Secondary | ICD-10-CM

## 2018-02-17 ENCOUNTER — Inpatient Hospital Stay (HOSPITAL_BASED_OUTPATIENT_CLINIC_OR_DEPARTMENT_OTHER): Payer: Medicare Other | Admitting: Oncology

## 2018-02-17 ENCOUNTER — Other Ambulatory Visit: Payer: Self-pay | Admitting: *Deleted

## 2018-02-17 ENCOUNTER — Telehealth: Payer: Self-pay | Admitting: *Deleted

## 2018-02-17 ENCOUNTER — Inpatient Hospital Stay: Payer: Medicare Other | Attending: Oncology

## 2018-02-17 VITALS — BP 126/87 | HR 92 | Temp 98.4°F | Resp 18 | Ht 71.0 in | Wt 206.1 lb

## 2018-02-17 DIAGNOSIS — E291 Testicular hypofunction: Secondary | ICD-10-CM

## 2018-02-17 DIAGNOSIS — Z79899 Other long term (current) drug therapy: Secondary | ICD-10-CM | POA: Insufficient documentation

## 2018-02-17 DIAGNOSIS — C61 Malignant neoplasm of prostate: Secondary | ICD-10-CM | POA: Insufficient documentation

## 2018-02-17 DIAGNOSIS — M545 Low back pain: Secondary | ICD-10-CM | POA: Diagnosis not present

## 2018-02-17 DIAGNOSIS — C7951 Secondary malignant neoplasm of bone: Secondary | ICD-10-CM

## 2018-02-17 DIAGNOSIS — R110 Nausea: Secondary | ICD-10-CM

## 2018-02-17 DIAGNOSIS — R630 Anorexia: Secondary | ICD-10-CM

## 2018-02-17 LAB — CBC WITH DIFFERENTIAL (CANCER CENTER ONLY)
Basophils Absolute: 0 10*3/uL (ref 0.0–0.1)
Basophils Relative: 0 %
EOS ABS: 0 10*3/uL (ref 0.0–0.5)
Eosinophils Relative: 1 %
HEMATOCRIT: 35.4 % — AB (ref 38.4–49.9)
HEMOGLOBIN: 11.6 g/dL — AB (ref 13.0–17.1)
Lymphocytes Relative: 11 %
Lymphs Abs: 0.8 10*3/uL — ABNORMAL LOW (ref 0.9–3.3)
MCH: 29 pg (ref 27.2–33.4)
MCHC: 32.8 g/dL (ref 32.0–36.0)
MCV: 88.4 fL (ref 79.3–98.0)
MONOS PCT: 4 %
Monocytes Absolute: 0.3 10*3/uL (ref 0.1–0.9)
NEUTROS PCT: 84 %
Neutro Abs: 6.3 10*3/uL (ref 1.5–6.5)
Platelet Count: 216 10*3/uL (ref 140–400)
RBC: 4 MIL/uL — AB (ref 4.20–5.82)
RDW: 16.2 % — ABNORMAL HIGH (ref 11.0–14.6)
WBC Count: 7.5 10*3/uL (ref 4.0–10.3)

## 2018-02-17 LAB — CMP (CANCER CENTER ONLY)
ALBUMIN: 3.6 g/dL (ref 3.5–5.0)
ALT: 12 U/L (ref 0–44)
AST: 23 U/L (ref 15–41)
Alkaline Phosphatase: 125 U/L (ref 38–126)
Anion gap: 9 (ref 5–15)
BUN: 21 mg/dL (ref 8–23)
CHLORIDE: 105 mmol/L (ref 98–111)
CO2: 25 mmol/L (ref 22–32)
Calcium: 9.3 mg/dL (ref 8.9–10.3)
Creatinine: 1.58 mg/dL — ABNORMAL HIGH (ref 0.61–1.24)
GFR, Est AFR Am: 45 mL/min — ABNORMAL LOW (ref >60)
GFR, Estimated: 38 mL/min — ABNORMAL LOW (ref >60)
GLUCOSE: 103 mg/dL — AB (ref 70–99)
POTASSIUM: 4.6 mmol/L (ref 3.5–5.1)
Sodium: 139 mmol/L (ref 135–145)
Total Bilirubin: 0.5 mg/dL (ref 0.3–1.2)
Total Protein: 6.8 g/dL (ref 6.5–8.1)

## 2018-02-17 MED ORDER — PROMETHAZINE HCL 12.5 MG PO TABS
12.5000 mg | ORAL_TABLET | Freq: Four times a day (QID) | ORAL | 2 refills | Status: DC | PRN
Start: 2018-02-17 — End: 2018-05-04

## 2018-02-17 MED ORDER — MIRTAZAPINE 7.5 MG PO TABS: 7.5000 mg | ORAL_TABLET | Freq: Every day | ORAL | 1 refills | Status: DC

## 2018-02-17 MED FILL — ABIRATERONE ACETATE 250 MG: 250 | 30 days supply | Qty: 120 | Fill #0

## 2018-02-17 NOTE — Telephone Encounter (Signed)
TC from pt's wife regarding pt's appt today. She states that patient wants to cancel today's appts (Lab and Dr. Alen Blew) due to potentially bad weather this afternoon. High priority message sent to scheduling. And transferred call to scheduling.

## 2018-02-17 NOTE — Telephone Encounter (Signed)
Spoke with Dr. Hazeline Junker nurse regarding pt's appt today. She wants pt to come in now before the rain starts and they will work in his lab appt and his appt with Dr. Alen Blew.  Spoke with Mrs. Talcott and relayed this information and she and her husband will come in this morning for his appt.   Dr. Hazeline Junker nurse aware.

## 2018-02-17 NOTE — Progress Notes (Signed)
Hematology and Oncology Follow Up Visit  Eric Deleon [id number] 03-16-1932 84 y.o. 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  He was initially diagnosed with Gleason score 4+3 = 7 and a PSA for in 2007.     Prior Therapy:  He received a definitive therapy with radiation therapy and developed biochemical relapse initially treated with androgen deprivation.  He developed bony metastasis including a frontal bone metastasis and was treated with radiation therapy in February of 2013. PSA was up to 13.38 despite castration levels of testosterone in 2015 indicating castration resistant disease. Xtandi 160 mg daily started in August of 2015.  Therapy discontinued in May 2019.  Current therapy: Zytiga 1000 mg daily with prednisone at 5 mg daily.  Interim History: Eric Deleon presents today for a follow-up visit with his family.  Since last visit, he continues to take Zytiga and prednisone without complications.  He does report grade 1 fatigue and some nausea but no vomiting.  Despite that he is able to eat and have gained 6 pounds since last visit.  Has performance status and activity level is unchanged.  He denies any lower extremity edema, bone pain or pathological fractures.  He does report intermittent lower back pain which he takes hydrocodone very rarely.  His quality of life not dramatically changed.  He does not report any headaches or blurry vision or double vision.  He denies any dizziness or lethargy.  He does not report any fevers, chills, sweats or appetite changes.  Does not report any chest pain, orthopnea or leg edema.  He does not report any cough, wheezing or hemoptysis.He does not report any vomiting, abdominal distention or change in his bowel habits.  Does not report any frequency urgency or hesitancy.  He denies any bone pain  or pathological fractures.  He does not report any ecchymosis or bleeding.  Does not report any is in his mood.Eric Deleon of his review of systems is negative.   Medications:   Current Outpatient Medications  Medication Sig Dispense Refill  . abiraterone acetate (ZYTIGA) 250 MG tablet TAKE 4 TABLETS (1,000 MG TOTAL) BY MOUTH DAILY. TAKE ON AN EMPTY STOMACH 1 HOUR BEFORE OR 2 HOURS AFTER A MEAL 120 tablet 0  . allopurinol (ZYLOPRIM) 300 MG tablet Take 300 mg by mouth daily.  3  . atorvastatin (LIPITOR) 20 MG tablet Take 20 mg by mouth daily.  4  . diphenhydrAMINE (BENADRYL) 25 MG tablet Take 25 mg by mouth every 6 (six) hours as needed.    Marland Kitchen HYDROcodone-acetaminophen (NORCO) 5-325 MG tablet Take 1-2 tablets by mouth every 6 (six) hours as needed for moderate pain. 60 tablet 0  . LORazepam (ATIVAN) 1 MG tablet as directed.  1  . mirtazapine (REMERON) 7.5 MG tablet TAKE 1 TABLET BY MOUTH EVERY NIGHT AT BEDTIME 90 tablet 1  . omeprazole (PRILOSEC) 20 MG capsule Take 20 mg by mouth daily.    . predniSONE (DELTASONE) 5 MG tablet Take 1 tablet (5 mg total) by mouth daily with breakfast. 90 tablet 3  . simethicone (MYLICON) 80 MG chewable tablet Chew 80 mg by mouth every 6 (six) hours as needed.    . Tamsulosin HCl (FLOMAX) 0.4 MG CAPS Take by mouth.    . traMADol (ULTRAM) 50 MG tablet Take 1 tablet (50 mg total) by mouth every 6 (six) hours as  needed. 30 tablet 0  . Vitamin D, Cholecalciferol, 400 UNITS CHEW Chew 800 Units by mouth daily.    . promethazine (PHENERGAN) 12.5 MG tablet Take 1 tablet (12.5 mg total) by mouth every 6 (six) hours as needed for nausea. 30 tablet 2   No current facility-administered medications for this visit.      Allergies:  No Known Allergies  Past Medical History, Surgical history, Social history, and Family History were reviewed and updated.   Physical Exam: Blood pressure 126/87, pulse 92, temperature 98.4 F (36.9 C), temperature source Oral, resp. rate  18, height 5\' 11"  (1.803 m), weight 206 lb 1.6 oz (93.5 kg), SpO2 100 %.   ECOG: 1   General appearance: Comfortable appearing without any discomfort Head: Normocephalic without any trauma Oropharynx: Mucous membranes are moist and pink without any thrush or ulcers. Eyes: Pupils are equal and round reactive to light. Lymph nodes: No cervical, supraclavicular, inguinal or axillary lymphadenopathy.   Heart:regular rate and rhythm.  S1 and S2 without leg edema. Lung: Clear without any rhonchi or wheezes.  No dullness to percussion. Abdomin: Soft, nontender, nondistended with good bowel sounds.  No hepatosplenomegaly. Musculoskeletal: No joint deformity or effusion.  Full range of motion noted. Neurological: No deficits noted on motor, sensory and deep tendon reflex exam. Skin: No petechial rash or dryness.  Appeared moist.    Lab Results: Lab Results  Component Value Date   WBC 7.5 02/17/2018   HGB 11.6 (L) 02/17/2018   HCT 35.4 (L) 02/17/2018   MCV 88.4 02/17/2018   PLT 216 02/17/2018     Chemistry      Component Value Date/Time   NA 139 02/17/2018 1227   NA 136 04/28/2017 1123   K 4.6 02/17/2018 1227   K 4.6 04/28/2017 1123   CL 105 02/17/2018 1227   CO2 25 02/17/2018 1227   CO2 25 04/28/2017 1123   BUN 21 02/17/2018 1227   BUN 24.1 04/28/2017 1123   CREATININE 1.58 (H) 02/17/2018 1227   CREATININE 1.8 (H) 04/28/2017 1123      Component Value Date/Time   CALCIUM 9.3 02/17/2018 1227   CALCIUM 9.2 04/28/2017 1123   ALKPHOS 125 02/17/2018 1227   ALKPHOS 81 04/28/2017 1123   AST 23 02/17/2018 1227   AST 14 04/28/2017 1123   ALT 12 02/17/2018 1227   ALT 10 04/28/2017 1123   BILITOT 0.5 02/17/2018 1227   BILITOT 0.36 04/28/2017 1123        Results for Eric Deleon, Eric Deleon (MRN [id number]) as of 02/17/2018 13:04  Ref. Range 10/26/2017 11:10 12/09/2017 13:17  Prostate Specific Ag, Serum Latest Ref Range: 0.0 - 4.0 ng/mL 73.8 (H) 55.1 (H)      Impression and  Plan:  84 year old with:  1.  Advanced castration-resistant prostate cancer with disease to the bone as well as lymphadenopathy documented in 2013.  He remains on Zytiga without any major complications.  His PSA continues to respond at this time with a drop currently 55.  Risks and benefits of continuing this therapy was reviewed today is agreeable to continue.  Alternative therapy to Zytiga would be systemic chemotherapy.  Trudi Ida would be less than ideal option given his nodal metastasis.    2. Androgen depravation: Continues to receive Lupron under the care of Dr. Diona Fanti which I recommended continue this indefinitely.  3. Bone directed therapy: He is receiving Xgeva without any complications on the care of Dr. Diona Fanti.  4.  Weight loss: Resolved at this time  and currently have gained 6 pounds since last visit.  I recommended continuing Remeron at this time.  5.  Pain: Takes hydrocodone infrequently.  Overall his pain is under control.  6.  Nausea: He uses Phenergan as needed.  This has been manageable and we have discussed strategies to schedule his Phenergan 3 times a day for better control of his nausea.  7. Followup: Will be in 6 weeks repeat evaluation.  15 minutes was spent with the patient face-to-face today.  More than 50% of time was dedicated to reviewing the natural course of his disease, treatment options and managing complications related to therapy.Zola Button, MD 9/5/20191:17 PM

## 2018-02-18 ENCOUNTER — Telehealth: Payer: Self-pay | Admitting: *Deleted

## 2018-02-18 ENCOUNTER — Other Ambulatory Visit: Payer: Self-pay | Admitting: Oncology

## 2018-02-18 DIAGNOSIS — C61 Malignant neoplasm of prostate: Secondary | ICD-10-CM

## 2018-02-18 LAB — PROSTATE-SPECIFIC AG, SERUM (LABCORP): Prostate Specific Ag, Serum: 129 ng/mL — ABNORMAL HIGH (ref 0.0–4.0)

## 2018-02-18 NOTE — Telephone Encounter (Signed)
As noted below by Dr. Alen Blew, I informed the wife of his PSA level. He is to continue Zytiga for now. Dr. Alen Blew is ordering a CT scan and a bone scan before the next office visit. She verbalized understanding.

## 2018-02-18 NOTE — Telephone Encounter (Signed)
-----   Message from Wyatt Portela, MD sent at 02/18/2018  8:22 AM EDT ----- Please let him know his PSA is up. He is to continue Zytiga for now. We will get a CT scan and bone scan before the next visit and will decide to after that about the next step.

## 2018-03-10 ENCOUNTER — Other Ambulatory Visit: Payer: Self-pay | Admitting: Oncology

## 2018-03-10 DIAGNOSIS — C61 Malignant neoplasm of prostate: Secondary | ICD-10-CM

## 2018-03-11 ENCOUNTER — Other Ambulatory Visit: Payer: Self-pay | Admitting: *Deleted

## 2018-03-11 DIAGNOSIS — C61 Malignant neoplasm of prostate: Secondary | ICD-10-CM | POA: Diagnosis not present

## 2018-03-11 DIAGNOSIS — R630 Anorexia: Secondary | ICD-10-CM

## 2018-03-11 DIAGNOSIS — C7951 Secondary malignant neoplasm of bone: Secondary | ICD-10-CM | POA: Diagnosis not present

## 2018-03-11 DIAGNOSIS — G893 Neoplasm related pain (acute) (chronic): Secondary | ICD-10-CM

## 2018-03-11 MED ORDER — MIRTAZAPINE 7.5 MG PO TABS: 7.5000 mg | ORAL_TABLET | Freq: Every day | ORAL | 0 refills | Status: DC

## 2018-03-11 MED ORDER — HYDROCODONE-ACETAMINOPHEN 5-325 MG PO TABS: 1.0000 | ORAL_TABLET | Freq: Four times a day (QID) | ORAL | 0 refills | Status: DC | PRN

## 2018-03-15 MED FILL — HYDROCODON-APAP 5-325: 5-325 | 7 days supply | Qty: 60 | Fill #0

## 2018-03-15 MED FILL — ABIRATERONE ACETATE 250 MG: 250 | 30 days supply | Qty: 120 | Fill #0

## 2018-03-23 ENCOUNTER — Encounter (HOSPITAL_COMMUNITY): Payer: Self-pay

## 2018-03-23 ENCOUNTER — Ambulatory Visit (HOSPITAL_COMMUNITY)
Admission: RE | Admit: 2018-03-23 | Discharge: 2018-03-23 | Disposition: A | Discharge status: Home / Self Care | Payer: Medicare Other | Source: Ambulatory Visit | Attending: Oncology | Admitting: Oncology

## 2018-03-23 DIAGNOSIS — C61 Malignant neoplasm of prostate: Secondary | ICD-10-CM | POA: Insufficient documentation

## 2018-03-23 DIAGNOSIS — I700 Atherosclerosis of aorta: Secondary | ICD-10-CM | POA: Insufficient documentation

## 2018-03-23 DIAGNOSIS — C7951 Secondary malignant neoplasm of bone: Secondary | ICD-10-CM | POA: Insufficient documentation

## 2018-03-23 DIAGNOSIS — C775 Secondary and unspecified malignant neoplasm of intrapelvic lymph nodes: Secondary | ICD-10-CM | POA: Diagnosis not present

## 2018-03-23 MED ORDER — IOHEXOL 300 MG/ML  SOLN
80.0000 mL | Freq: Once | INTRAMUSCULAR | Status: AC | PRN
  Administered 2018-03-23: 80 mL via INTRAVENOUS

## 2018-03-23 MED ORDER — TECHNETIUM TC 99M MEDRONATE IV KIT
20.0000 | PACK | Freq: Once | INTRAVENOUS | Status: AC
  Administered 2018-03-23: 20 via INTRAVENOUS

## 2018-03-23 MED ORDER — SODIUM CHLORIDE 0.9 % IJ SOLN
INTRAMUSCULAR | Status: AC
  Filled 2018-03-23: qty 50

## 2018-04-05 ENCOUNTER — Inpatient Hospital Stay: Payer: Medicare Other

## 2018-04-05 ENCOUNTER — Inpatient Hospital Stay: Payer: Medicare Other | Attending: Oncology | Admitting: Oncology

## 2018-04-05 ENCOUNTER — Telehealth: Payer: Self-pay | Admitting: Oncology

## 2018-04-05 VITALS — BP 149/76 | HR 71 | Temp 98.2°F | Resp 17 | Ht 71.0 in | Wt 213.7 lb

## 2018-04-05 DIAGNOSIS — G893 Neoplasm related pain (acute) (chronic): Secondary | ICD-10-CM | POA: Diagnosis not present

## 2018-04-05 DIAGNOSIS — E291 Testicular hypofunction: Secondary | ICD-10-CM | POA: Diagnosis not present

## 2018-04-05 DIAGNOSIS — C7951 Secondary malignant neoplasm of bone: Secondary | ICD-10-CM | POA: Insufficient documentation

## 2018-04-05 DIAGNOSIS — C61 Malignant neoplasm of prostate: Secondary | ICD-10-CM | POA: Diagnosis not present

## 2018-04-05 DIAGNOSIS — Z79899 Other long term (current) drug therapy: Secondary | ICD-10-CM | POA: Insufficient documentation

## 2018-04-05 DIAGNOSIS — C775 Secondary and unspecified malignant neoplasm of intrapelvic lymph nodes: Secondary | ICD-10-CM | POA: Diagnosis not present

## 2018-04-05 LAB — CBC WITH DIFFERENTIAL (CANCER CENTER ONLY)
ABS IMMATURE GRANULOCYTES: 0.03 10*3/uL (ref 0.00–0.07)
Basophils Absolute: 0 10*3/uL (ref 0.0–0.1)
Basophils Relative: 0 %
Eosinophils Absolute: 0.1 10*3/uL (ref 0.0–0.5)
Eosinophils Relative: 1 %
HEMATOCRIT: 36 % — AB (ref 39.0–52.0)
Hemoglobin: 11.5 g/dL — ABNORMAL LOW (ref 13.0–17.0)
IMMATURE GRANULOCYTES: 0 %
Lymphocytes Relative: 13 %
Lymphs Abs: 1 10*3/uL (ref 0.7–4.0)
MCH: 29.6 pg (ref 26.0–34.0)
MCHC: 31.9 g/dL (ref 30.0–36.0)
MCV: 92.5 fL (ref 80.0–100.0)
MONO ABS: 0.4 10*3/uL (ref 0.1–1.0)
MONOS PCT: 5 %
NEUTROS ABS: 6 10*3/uL (ref 1.7–7.7)
NEUTROS PCT: 81 %
Platelet Count: 157 10*3/uL (ref 150–400)
RBC: 3.89 MIL/uL — ABNORMAL LOW (ref 4.22–5.81)
RDW: 15.3 % (ref 11.5–15.5)
WBC Count: 7.5 10*3/uL (ref 4.0–10.5)
nRBC: 0 % (ref 0.0–0.2)

## 2018-04-05 LAB — CMP (CANCER CENTER ONLY)
ALBUMIN: 3.3 g/dL — AB (ref 3.5–5.0)
ALK PHOS: 120 U/L (ref 38–126)
ALT: 10 U/L (ref 0–44)
ANION GAP: 9 (ref 5–15)
AST: 19 U/L (ref 15–41)
BUN: 24 mg/dL — AB (ref 8–23)
CALCIUM: 9.6 mg/dL (ref 8.9–10.3)
CO2: 27 mmol/L (ref 22–32)
Chloride: 103 mmol/L (ref 98–111)
Creatinine: 1.57 mg/dL — ABNORMAL HIGH (ref 0.61–1.24)
GFR, Est AFR Am: 45 mL/min — ABNORMAL LOW (ref >60)
GFR, Estimated: 39 mL/min — ABNORMAL LOW (ref >60)
GLUCOSE: 104 mg/dL — AB (ref 70–99)
Potassium: 4.6 mmol/L (ref 3.5–5.1)
SODIUM: 139 mmol/L (ref 135–145)
Total Bilirubin: 0.4 mg/dL (ref 0.3–1.2)
Total Protein: 6.7 g/dL (ref 6.5–8.1)

## 2018-04-05 NOTE — Progress Notes (Signed)
Hematology and Oncology Follow Up Visit  Eric Deleon [id number] [birth year] 84 y.o. 04/05/2018 9:41 AM Eric Deleon, MDPerini, [name], MD   Principle Diagnosis: 84 year old man with advanced castration-resistant prostate cancer diagnosed in 2013.  He was initially diagnosed with Gleason score 4+3 = 7 and a PSA for in 2007.  He has known disease to the bone.   Prior Therapy:  He received a definitive therapy with radiation therapy and developed biochemical relapse initially treated with androgen deprivation.  He developed bony metastasis including a frontal bone metastasis and was treated with radiation therapy in February of 2013. PSA was up to 13.38 despite castration levels of testosterone in 2015 indicating castration resistant disease. Xtandi 160 mg daily started in August of 2015.  Therapy discontinued in May 2019.  Current therapy: Zytiga 1000 mg daily with prednisone at 5 mg daily.  Interim History: Eric Deleon returns today for repeat evaluation.  Since her last visit, he has complained of jaw swelling and salivary gland dysfunction.  He was evaluated by ENT and was started on antibiotics on multiple occasions.  Despite this issues, he still is able to eat and maintain his weight at this time.  He denies any worsening bone pain or pathological fractures.  He continues to tolerate Zytiga without any issues.  He denies any nausea or excessive fatigue.  He denies any major changes in his performance status or activity level.  His quality of life is unchanged.  He does report some back and pelvic discomfort and uses hydrocodone periodically.  He does not report any headaches or blurry vision or double vision.  He denies any alteration in mental status or confusion.  He does not report any fevers, chills, sweats or appetite changes.  Does not report any chest pain, orthopnea or leg edema.  He does not report any cough, wheezing or hemoptysis.He does not report any vomiting.  Denies any change in  bowel habits.  Does not report any frequency urgency or hesitancy.  He denies any paralysis or myalgias.  He does not report any bleeding or clotting tendency.  Does not report any diarrhea or depression.  Remainder of his review of systems is negative.   Medications:   Current Outpatient Medications  Medication Sig Dispense Refill  . abiraterone acetate (ZYTIGA) 250 MG tablet TAKE 4 TABLETS (1,000 MG TOTAL) BY MOUTH DAILY. TAKE ON AN EMPTY STOMACH 1 HOUR BEFORE OR 2 HOURS AFTER A MEAL 120 tablet 0  . allopurinol (ZYLOPRIM) 300 MG tablet Take 300 mg by mouth daily.  3  . atorvastatin (LIPITOR) 20 MG tablet Take 20 mg by mouth daily.  4  . diphenhydrAMINE (BENADRYL) 25 MG tablet Take 25 mg by mouth every 6 (six) hours as needed.    Marland Kitchen HYDROcodone-acetaminophen (NORCO) 5-325 MG tablet Take 1-2 tablets by mouth every 6 (six) hours as needed for moderate pain. 60 tablet 0  . LORazepam (ATIVAN) 1 MG tablet as directed.  1  . mirtazapine (REMERON) 7.5 MG tablet Take 1 tablet (7.5 mg total) by mouth at bedtime. 90 tablet 0  . omeprazole (PRILOSEC) 20 MG capsule Take 20 mg by mouth daily.    . predniSONE (DELTASONE) 5 MG tablet Take 1 tablet (5 mg total) by mouth daily with breakfast. 90 tablet 3  . promethazine (PHENERGAN) 12.5 MG tablet Take 1 tablet (12.5 mg total) by mouth every 6 (six) hours as needed for nausea. 30 tablet 2  . simethicone (MYLICON) 80 MG chewable tablet Chew 80 mg by  mouth every 6 (six) hours as needed.    . Tamsulosin HCl (FLOMAX) 0.4 MG CAPS Take by mouth.    . traMADol (ULTRAM) 50 MG tablet Take 1 tablet (50 mg total) by mouth every 6 (six) hours as needed. 30 tablet 0  . Vitamin D, Cholecalciferol, 400 UNITS CHEW Chew 800 Units by mouth daily.     No current facility-administered medications for this visit.      Allergies:  No Known Allergies  Past Medical History, Surgical history, Social history, and Family History were reviewed and updated.   Physical  Exam:  Blood pressure (!) 149/76, pulse 71, temperature 98.2 F (36.8 C), temperature source Oral, resp. rate 17, height 5\' 11"  (1.803 m), weight 213 lb 11.2 oz (96.9 kg), SpO2 98 %.   ECOG: 1   General appearance: Alert, awake without any distress. Head:  Erythema and induration noted around his submental area. Oropharynx: Without any thrush or ulcers. Eyes: No scleral icterus. Lymph nodes: No lymphadenopathy noted in the cervical, supraclavicular, or axillary nodes Heart:regular rate and rhythm, without any murmurs or gallops.   Lung: Clear to auscultation without any rhonchi, wheezes or dullness to percussion. Abdomin: Soft, nontender without any shifting dullness or ascites. Musculoskeletal: No clubbing or cyanosis. Neurological: No motor or sensory deficits. Skin: No rashes or lesions. Psychiatric: Mood and affect appeared normal.    Lab Results: Lab Results  Component Value Date   WBC 7.5 02/17/2018   HGB 11.6 (L) 02/17/2018   HCT 35.4 (L) 02/17/2018   MCV 88.4 02/17/2018   PLT 216 02/17/2018     Chemistry      Component Value Date/Time   NA 139 02/17/2018 1227   NA 136 04/28/2017 1123   K 4.6 02/17/2018 1227   K 4.6 04/28/2017 1123   CL 105 02/17/2018 1227   CO2 25 02/17/2018 1227   CO2 25 04/28/2017 1123   BUN 21 02/17/2018 1227   BUN 24.1 04/28/2017 1123   CREATININE 1.58 (H) 02/17/2018 1227   CREATININE 1.8 (H) 04/28/2017 1123      Component Value Date/Time   CALCIUM 9.3 02/17/2018 1227   CALCIUM 9.2 04/28/2017 1123   ALKPHOS 125 02/17/2018 1227   ALKPHOS 81 04/28/2017 1123   AST 23 02/17/2018 1227   AST 14 04/28/2017 1123   ALT 12 02/17/2018 1227   ALT 10 04/28/2017 1123   BILITOT 0.5 02/17/2018 1227   BILITOT 0.36 04/28/2017 1123          Results for Eric Deleon (MRN [id number]) as of 04/05/2018 09:42  Ref. Range 12/09/2017 13:17 02/17/2018 12:27  Prostate Specific Ag, Serum Latest Ref Range: 0.0 - 4.0 ng/mL 55.1 (H) 129.0 (H)     EXAM: CT ABDOMEN AND PELVIS WITH CONTRAST  TECHNIQUE: Multidetector CT imaging of the abdomen and pelvis was performed using the standard protocol following bolus administration of intravenous contrast.  CONTRAST:  69mL OMNIPAQUE IOHEXOL 300 MG/ML  SOLN  COMPARISON:  11/12/2017  FINDINGS: Lower chest: Unremarkable.  Hepatobiliary: No focal abnormality within the liver parenchyma. There is no evidence for gallstones, gallbladder wall thickening, or pericholecystic fluid. No intrahepatic or extrahepatic biliary dilation.  Pancreas: No focal mass lesion. No dilatation of the main duct. No intraparenchymal cyst. No peripancreatic edema.  Spleen: No splenomegaly. No focal mass lesion.  Adrenals/Urinary Tract: No adrenal nodule or mass. Bilateral renal cysts again noted, measuring up to 10.6 cm in the lower pole the left kidney. No evidence for hydroureter. The urinary bladder appears  normal for the degree of distention.  Stomach/Bowel: Stomach is nondistended. No gastric wall thickening. No evidence of outlet obstruction. Duodenum is normally positioned as is the ligament of Treitz. No small bowel wall thickening. No small bowel dilatation. The terminal ileum is normal. The appendix is normal. Diverticular changes are noted in the left colon without evidence of diverticulitis.  Vascular/Lymphatic: There is abdominal aortic atherosclerosis without aneurysm. No gastrohepatic or hepato duodenal ligament lymphadenopathy. 4.7 x 5.9 cm conglomeration of lymphadenopathy in the left para-aortic space has decreased slightly in size in the interval when measuring at the same level as on the prior study when it was recorded at 5.2 x 6.7 cm. Right external iliac lymph node has also decreased measuring 2.0 cm in the same dimension it was measured previously at 3.2 cm.  Reproductive: Brachytherapy seeds noted in the prostate gland.  Other: No intraperitoneal free  fluid.  Musculoskeletal: Bony metastatic disease in the pelvis and lumbar spine is similar to prior.  IMPRESSION: 1. No substantial change and sclerotic osseous metastatic involvement. 2. Interval decrease in para-aortic and right pelvic metastatic lymphadenopathy. 3.  Aortic Atherosclerois (ICD10-170.0)   Impression and Plan:  84 year old with:  1.  Prostate cancer with disease to the bone that is currently castration resistant.  He has tolerated Zytiga very well without any major complaints.  His PSA showed an reasonable response initially and subsequently increased.  Bone scan and CT scan obtained on 03/23/2018 were personally reviewed and discussed with the patient and his family.  He appears to have disease in the bone that has potentially progressed while his lymphadenopathy has regressed at this time.  Treatment options were discussed again which includes continuing Zytiga alone, adding Xofigo or proceeding with systemic chemotherapy.  After discussion today, we have recommended continuing Zytiga and consider Xofigo to treat his disease in the bone.  He is agreeable to proceed with this plan and I will make the appropriate referrals.    2. Androgen depravation: He remains on androgen deprivation therapy which I recommended continuing indefinitely.  3. Bone directed therapy: He has no issues obtaining Xgeva at this time I recommended continuing that for the time being.  4.  Weight loss: His weight is improved and have gained 7 pounds since last visit.  5.  Pain: His pain is predominantly in the bone related to prostate cancer.  He is currently using hydrocodone effectively.  6.  Nausea: Well controlled at this time with Phenergan.  I have recommended continuing this for the time being.  7. Followup: In the next 6 weeks to follow his progress.  25 minutes was spent with the patient face-to-face today.  More than 50% of time was dedicated to discussing his disease status,  laboratory data, imaging studies and coordinating plan of care.  Zola Button, MD 10/22/20199:41 AM

## 2018-04-05 NOTE — Telephone Encounter (Signed)
Scheduled appt per 10/22 los - gave patient AVS and calender per los. - radiation onc. Referral placed in [facility]

## 2018-04-06 ENCOUNTER — Other Ambulatory Visit: Payer: Self-pay | Admitting: Oncology

## 2018-04-06 ENCOUNTER — Encounter: Payer: Self-pay | Admitting: Radiation Oncology

## 2018-04-06 ENCOUNTER — Telehealth: Payer: Self-pay | Admitting: *Deleted

## 2018-04-06 DIAGNOSIS — C61 Malignant neoplasm of prostate: Secondary | ICD-10-CM

## 2018-04-06 LAB — PROSTATE-SPECIFIC AG, SERUM (LABCORP): PROSTATE SPECIFIC AG, SERUM: 214 ng/mL — AB (ref 0.0–4.0)

## 2018-04-06 NOTE — Telephone Encounter (Signed)
Spoke with wife. Gave results of last PSA 

## 2018-04-06 NOTE — Telephone Encounter (Signed)
-----   Message from Wyatt Portela, MD sent at 04/06/2018  8:53 AM EDT ----- Please let him know his PSA is up again.

## 2018-04-08 DIAGNOSIS — C7951 Secondary malignant neoplasm of bone: Secondary | ICD-10-CM | POA: Diagnosis not present

## 2018-04-08 DIAGNOSIS — C61 Malignant neoplasm of prostate: Secondary | ICD-10-CM | POA: Diagnosis not present

## 2018-04-08 MED FILL — ABIRATERONE ACETATE 250 MG: 250 | 30 days supply | Qty: 120 | Fill #0

## 2018-04-19 ENCOUNTER — Other Ambulatory Visit: Payer: Self-pay | Admitting: *Deleted

## 2018-04-19 DIAGNOSIS — G893 Neoplasm related pain (acute) (chronic): Secondary | ICD-10-CM

## 2018-04-19 MED ORDER — HYDROCODONE-ACETAMINOPHEN 5-325 MG PO TABS: 1.0000 | ORAL_TABLET | Freq: Four times a day (QID) | ORAL | 0 refills | Status: DC | PRN

## 2018-04-19 NOTE — Telephone Encounter (Signed)
Wife Pamala Hurry calling for refill on hydrocodone for patient. Script left at front for patient p/u

## 2018-04-20 ENCOUNTER — Encounter: Payer: Self-pay | Admitting: Radiation Oncology

## 2018-04-20 ENCOUNTER — Other Ambulatory Visit: Payer: Self-pay

## 2018-04-20 ENCOUNTER — Ambulatory Visit
Admission: RE | Admit: 2018-04-20 | Discharge: 2018-04-20 | Disposition: A | Discharge status: Home / Self Care | Payer: Medicare Other | Source: Ambulatory Visit | Attending: Radiation Oncology | Admitting: Radiation Oncology

## 2018-04-20 ENCOUNTER — Other Ambulatory Visit: Payer: Self-pay | Admitting: Oncology

## 2018-04-20 DIAGNOSIS — C7952 Secondary malignant neoplasm of bone marrow: Principal | ICD-10-CM

## 2018-04-20 DIAGNOSIS — Z923 Personal history of irradiation: Secondary | ICD-10-CM | POA: Diagnosis not present

## 2018-04-20 DIAGNOSIS — C7951 Secondary malignant neoplasm of bone: Secondary | ICD-10-CM | POA: Diagnosis not present

## 2018-04-20 DIAGNOSIS — R9721 Rising PSA following treatment for malignant neoplasm of prostate: Secondary | ICD-10-CM | POA: Diagnosis not present

## 2018-04-20 DIAGNOSIS — G893 Neoplasm related pain (acute) (chronic): Secondary | ICD-10-CM

## 2018-04-20 DIAGNOSIS — Z85828 Personal history of other malignant neoplasm of skin: Secondary | ICD-10-CM | POA: Insufficient documentation

## 2018-04-20 DIAGNOSIS — Z192 Hormone resistant malignancy status: Secondary | ICD-10-CM | POA: Diagnosis not present

## 2018-04-20 DIAGNOSIS — Z87891 Personal history of nicotine dependence: Secondary | ICD-10-CM | POA: Insufficient documentation

## 2018-04-20 DIAGNOSIS — Z79899 Other long term (current) drug therapy: Secondary | ICD-10-CM | POA: Diagnosis not present

## 2018-04-20 DIAGNOSIS — Z8042 Family history of malignant neoplasm of prostate: Secondary | ICD-10-CM | POA: Diagnosis not present

## 2018-04-20 DIAGNOSIS — C61 Malignant neoplasm of prostate: Secondary | ICD-10-CM | POA: Insufficient documentation

## 2018-04-20 MED ORDER — HYDROCODONE-ACETAMINOPHEN 5-325 MG PO TABS: 1.0000 | ORAL_TABLET | Freq: Four times a day (QID) | ORAL | 0 refills | Status: DC | PRN

## 2018-04-20 NOTE — Progress Notes (Signed)
Radiation Oncology         [phone] ________________________________  Initial outpatient Consultation  Name: Eric Deleon MRN: [id number]  Date of Service: 04/20/2018 DOB: 30-Sep-1931  KX:FGHWEX, [name], MD  Wyatt Portela, MD   REFERRING PHYSICIAN: Wyatt Portela, MD  DIAGNOSIS: 84 y.o. 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    ICD-10-CM   1. Malignant neoplasm of prostate ([place]) C61   2. Primary malignant neoplasm of prostate metastatic to bone [facility]) C61    C79.51     HISTORY OF PRESENT ILLNESS: Eric Deleon is a 84 y.o. male seen at the request of Dr. Alen Blew.  He was initially diagnosed with T2b, Gleason 4+3 adenocarcinoma of the prostate with PSA of 4.2 and 2007.  He was treated with a combination of external beam radiotherapy and seed boost under the care and direction of Dr. Valere Dross with an excellent response and decrease of PSA initially.  His PSA did increase to 1.15 in April 2009 and approximately 26.5 and 2010.  Casodex was added to his treatment regimen in 2010 and his PSA responded nicely, decreasing to 0.84.  In November 2012, he developed bony metastases including admit to the left frontal bone which was subsequently treated with palliative external beam radiotherapy under the care of Dr. Valere Dross- completed in 2013.  He developed multifocal bony metastatic disease in November 2013 including both iliac bones and the left acetabulum.  At that time, he was started on total androgen deprivation with combination of Lupron and Casodex as well as Xgeva in January 2014.  A bone scan in June 2015 showed progression of bony metastasis and PSA was further elevated at 10.64 despite having castrate levels of testosterone.  Therefore, he was referred for consultation with Dr. Alen Blew in medical oncology and elected to proceed with [facility] for further systemic treatment.  He remained on Xtandi in combination with ADT from August 2015 through  May 2019.  [name] was discontinued at that time due to disease progression with worsening bony metastasis and steady rise in the PSA.Marland Kitchen  He was started on Zytiga 1000 mg daily with prednisone 5 mg daily in June 2019 and has continued to tolerate this medication well.  He has continued with a steady rise in his PSA with a PSA of 55.1 on December 09, 2017, PSA of 129 on 02/17/2018 and most recently PSA of 214 on 04/05/2018.  His most recent restaging imaging with CT abdomen and pelvis and bone scan on 03/23/2018 showed improved pelvic lymphadenopathy but with progressive osseous metastatic disease as compared to prior imaging from 10/26/2017.  He has been able to remain active and independent.  Currently, his only complaint is of right shoulder pain which occasionally wakes him from sleep at night and makes it difficult for him to find a comfortable position to rest.  Currently, this is managed with pain medication and tolerable.  He has been kindly referred to [place] today to discuss consideration of Xofigo treatment in the management of his bony metastatic disease.  PREVIOUS RADIATION THERAPY: Yes 11/2005: Prostate/ 45 Gy in 25 fractions 12/14/2005: Prostate seed implant/ 90 Gy   07/29/2011 - 08/11/2011: Left Frontal Bone/ 30 Gy in 10 fractions   PAST MEDICAL HISTORY:  Past Medical History:  Diagnosis Date  . Allergy    allergic rhinitis  . Arthritis    finger  . BPH (benign prostatic hyperplasia)   . Cancer ([place])    "skin cancer"  . CRI (  chronic renal insufficiency)    mild asymptomatic for years  . Erectile dysfunction   . GERD (gastroesophageal reflux disease)   . Gout   . History of pneumonia    when in the [employer]  . IFG (impaired fasting glucose)   . Prostate cancer ([place])   . Skin cancer    non melanoma      PAST SURGICAL HISTORY: Past Surgical History:  Procedure Laterality Date  . history of radiation therapy    . skin cancer excised     Multiple skin cancers excised from hands and  back    FAMILY HISTORY:  Family History  Problem Relation Age of Onset  . COPD Mother   . Heart disease Mother   . Heart attack Father   . Prostate cancer Father   . Cancer Father   . Cancer Brother   . Prostate cancer Brother     SOCIAL HISTORY:  Social History   Socioeconomic History  . Marital status: Married    Spouse name: Not on file  . Number of children: Not on file  . Years of education: Not on file  . Highest education level: Not on file  Occupational History  . Not on file  Social Needs  . Financial resource strain: Not on file  . Food insecurity:    Worry: Not on file    Inability: Not on file  . Transportation needs:    Medical: Not on file    Non-medical: Not on file  Tobacco Use  . Smoking status: Former Smoker    Packs/day: 0.50    Years: 15.00    Pack years: 7.50    Types: Cigarettes    Last attempt to quit: 06/30/1960    Years since quitting: 57.8  . Smokeless tobacco: Never Used  Substance and Sexual Activity  . Alcohol use: Yes    Comment: occasionally  red wine  and beer   . Drug use: No  . Sexual activity: Not Currently  Lifestyle  . Physical activity:    Days per week: Not on file    Minutes per session: Not on file  . Stress: Not on file  Relationships  . Social connections:    Talks on phone: Not on file    Gets together: Not on file    Attends religious service: Not on file    Active member of club or organization: Not on file    Attends meetings of clubs or organizations: Not on file    Relationship status: Not on file  . Intimate partner violence:    Fear of current or ex partner: Not on file    Emotionally abused: Not on file    Physically abused: Not on file    Forced sexual activity: Not on file  Other Topics Concern  . Not on file  Social History Narrative  . Not on file    ALLERGIES: Patient has no known allergies.  MEDICATIONS:  Current Outpatient Medications  Medication Sig Dispense Refill  . abiraterone  acetate (ZYTIGA) 250 MG tablet TAKE 4 TABLETS (1,000 MG TOTAL) BY MOUTH DAILY. TAKE ON AN EMPTY STOMACH 1 HOUR BEFORE OR 2 HOURS AFTER A MEAL 120 tablet 0  . allopurinol (ZYLOPRIM) 300 MG tablet Take 300 mg by mouth daily.  3  . atorvastatin (LIPITOR) 20 MG tablet Take 20 mg by mouth daily.  4  . mirtazapine (REMERON) 7.5 MG tablet Take 1 tablet (7.5 mg total) by mouth at bedtime. 90 tablet  0  . omeprazole (PRILOSEC) 20 MG capsule Take 20 mg by mouth daily.    . predniSONE (DELTASONE) 5 MG tablet Take 1 tablet (5 mg total) by mouth daily with breakfast. 90 tablet 3  . promethazine (PHENERGAN) 12.5 MG tablet Take 1 tablet (12.5 mg total) by mouth every 6 (six) hours as needed for nausea. 30 tablet 2  . simethicone (MYLICON) 80 MG chewable tablet Chew 80 mg by mouth every 6 (six) hours as needed.    . Tamsulosin HCl (FLOMAX) 0.4 MG CAPS Take by mouth.    . Vitamin D, Cholecalciferol, 400 UNITS CHEW Chew 800 Units by mouth daily.    Marland Kitchen HYDROcodone-acetaminophen (NORCO) 5-325 MG tablet Take 1-2 tablets by mouth every 6 (six) hours as needed for moderate pain. 60 tablet 0  . LORazepam (ATIVAN) 1 MG tablet as directed.  1   No current facility-administered medications for this encounter.     REVIEW OF SYSTEMS:  On review of systems, the patient reports that he is doing well overall. He denies any chest pain, shortness of breath, cough, fevers, chills, night sweats, unintended weight changes. He denies any bowel or bladder disturbances, and denies abdominal pain, nausea or vomiting. He reports pain to his right shoulder that is a 5 out of 10. He states the pain is constant aching and throbbing that "feels deep and most bothersome at night".  He denies other site specific pain.  A complete review of systems is obtained and is otherwise negative.  PHYSICAL EXAM:  Wt Readings from Last 3 Encounters:  04/20/18 211 lb 8 oz (95.9 kg)  04/05/18 213 lb 11.2 oz (96.9 kg)  02/17/18 206 lb 1.6 oz (93.5 kg)    Temp Readings from Last 3 Encounters:  04/20/18 98.2 F (36.8 C) (Oral)  04/05/18 98.2 F (36.8 C) (Oral)  02/17/18 98.4 F (36.9 C) (Oral)   BP Readings from Last 3 Encounters:  04/20/18 138/79  04/05/18 (!) 149/76  02/17/18 126/87   Pulse Readings from Last 3 Encounters:  04/20/18 92  04/05/18 71  02/17/18 92   Pain Assessment Pain Score: 5  Pain Loc: Shoulder/10  In general this is a well appearing Caucasian gentleman in no acute distress. He is alert and oriented x4 and appropriate throughout the examination. HEENT reveals that the patient is normocephalic, atraumatic. EOMs are intact. PERRLA. Skin is intact without any evidence of gross lesions. Cardiovascular exam reveals a regular rate and rhythm, no clicks rubs or murmurs are auscultated. Chest is clear to auscultation bilaterally. Lymphatic assessment is performed and does not reveal any adenopathy in the cervical, supraclavicular, axillary, or inguinal chains. Abdomen has active bowel sounds in all quadrants and is intact. The abdomen is soft, non tender, non distended. Lower extremities are negative for pretibial pitting edema, deep calf tenderness, cyanosis or clubbing.   KPS = 90  100 - Normal; no complaints; no evidence of disease. 90   - Able to carry on normal activity; minor signs or symptoms of disease. 80   - Normal activity with effort; some signs or symptoms of disease. 53   - Cares for self; unable to carry on normal activity or to do active work. 60   - Requires occasional assistance, but is able to care for most of his personal needs. 50   - Requires considerable assistance and frequent medical care. 68   - Disabled; requires special care and assistance. 61   - Severely disabled; hospital admission is indicated although death  not imminent. 95   - Very sick; hospital admission necessary; active supportive treatment necessary. 10   - Moribund; fatal processes progressing rapidly. 0     -  Dead  Karnofsky DA, Abelmann Blackwell, Craver LS and Bur[facility] [phone]) The use of the nitrogen mustards in the palliative treatment of carcinoma: with particular reference to bronchogenic carcinoma Cancer 1 634-56  LABORATORY DATA:  Lab Results  Component Value Date   WBC 7.5 04/05/2018   HGB 11.5 (L) 04/05/2018   HCT 36.0 (L) 04/05/2018   MCV 92.5 04/05/2018   PLT 157 04/05/2018   Lab Results  Component Value Date   NA 139 04/05/2018   K 4.6 04/05/2018   CL 103 04/05/2018   CO2 27 04/05/2018   Lab Results  Component Value Date   ALT 10 04/05/2018   AST 19 04/05/2018   ALKPHOS 120 04/05/2018   BILITOT 0.4 04/05/2018     RADIOGRAPHY: No results found.    IMPRESSION/PLAN: 1. 84 y.o. 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 Today, we talked to the patient and family about the findings and workup thus far. We discussed the natural history of metastatic prostate cancer and general treatment, highlighting the role of Xogifo infusions in the management of progressive bony metastatic disease. We focused on the details of logistics and delivery. The recommendation is to proceed with monthly infusions of Xofigo delivered over the course of 6 months and will monitor labs prior to each infusion to ensure it is safe to proceed with each treatment. We reviewed the anticipated acute and late sequelae associated with Xofigo in this setting. The patient was encouraged to ask questions that were answered to his satisfaction.  At the end of our conversation, the patient elects to proceed with Xofigo infusions. We will share this information with Dr. Alen Blew and proceed with treatment planning accordingly.  We anticipate beginning treatment in the near future.  The patient appears to have a good understanding of his overall disease and our treatment recommendations and is in agreement with the stated plan.    Nicholos Johns, PA-C    Tyler Pita, MD  [facility] Direct Dial: [phone]  Fax: [phone] [email or web address]  Skype  [email or web address]   This document serves as a record of services personally performed by Tyler Pita, MD and Freeman Caldron, PA-C. It was created on their behalf by Wilburn Mylar, a trained medical scribe. The creation of this record is based on the scribe's personal observations and the provider's statements to them. This document has been checked and approved by the attending provider.

## 2018-04-20 NOTE — Progress Notes (Signed)
Histology and Location of Primary Cancer: prostatic adenocarcinoma  Sites of Visceral and Bony Metastatic Disease: bony and visceral   Location(s) of Symptomatic Metastases: right shoulder  Past/Anticipated chemotherapy by medical oncology, if any:  Prior Therapy:  He received a definitive therapy with radiation therapy and developed biochemical relapse initially treated with androgen deprivation.  He developed bony metastasis including a frontal bone metastasis and was treated with radiation therapy in February of 2013. PSA was up to 13.38 despite castration levels of testosterone in 2015 indicating castration resistant disease. Xtandi 160 mg daily started in August of 2015.  Therapy discontinued in May 2019.  Current therapy: Zytiga 1000 mg daily with prednisone at 5 mg daily.   Pain on a scale of 0-10 is: 5 on a scale of 0-10 in right shoulder. Describes pain as constant aching and throbbing. States, "the pain feels deep."   If Spine Met(s), symptoms, if any, include:  Bowel/Bladder retention or incontinence (please describe): Taking Flomax. Reports nocturia x 2.   Numbness or weakness in extremities (please describe): no  Current Decadron regimen, if applicable: prednisone  Ambulatory status? Walker? Wheelchair?: Ambulatory  SAFETY ISSUES:  Prior radiation? yes  Pacemaker/ICD? no  Possible current pregnancy? no  Is the patient on methotrexate? no  Current Complaints / other details:  84 year old male. Referred for consideration of xofigo.

## 2018-04-20 NOTE — Progress Notes (Signed)
See progressive note under physician encounter.

## 2018-04-21 ENCOUNTER — Encounter: Payer: Self-pay | Admitting: General Practice

## 2018-04-21 DIAGNOSIS — C61 Malignant neoplasm of prostate: Secondary | ICD-10-CM | POA: Insufficient documentation

## 2018-04-21 DIAGNOSIS — C7951 Secondary malignant neoplasm of bone: Secondary | ICD-10-CM

## 2018-04-21 NOTE — Progress Notes (Signed)
[facility] Psychosocial Distress Screening Clinical Social Work  Clinical Social Work was referred by distress screening protocol.  The patient scored a 10 on the Psychosocial Distress Thermometer which indicates severe distress. Clinical Social Worker contacted patient by phone to assess for distress and other psychosocial needs. Spoke w wife, pt has been under treatment for cancer for past 13 years, wife is experienced caregiver.  Pt is as independent as possible, has support from children.  No needs at this time, CSW encouraged contact w [place] as needed.  ONCBCN DISTRESS SCREENING 04/21/2018  Screening Type Initial Screening  Distress experienced in past week (1-10) 10  Practical problem type Insurance  Family Problem type Partner;Children  Emotional problem type Depression;Feeling hopeless;Boredom  Spiritual/Religous concerns type Loss of sense of purpose  Physical Problem type Pain;Nausea/vomiting;Sleep/insomnia;Getting around;Loss of appetitie;Constipation/diarrhea;Sexual problems;Skin dry/itchy;Swollen arms/legs  Physician notified of physical symptoms Yes  Referral to clinical psychology No  Referral to clinical social work No  Referral to dietition No  Referral to financial advocate No  Referral to support programs No  Referral to palliative care No    Clinical Social Worker follow up needed: No.  If yes, follow up plan:  Beverely Pace, [place], LCSW Clinical Social Worker Phone:  [phone]

## 2018-04-22 NOTE — Progress Notes (Signed)
  Radiation Oncology         [phone] ________________________________  Name: Eric Deleon MRN: [id number]  Date: 04/20/2018  DOB: 1931-12-16  Xofigo Treatment Planning Note:  Diagnosis:  Castration resistant prostate cancer with painful bone involvement  Narrative: Mr.Eric Deleon is a patient who has been diagnosed with castration resistant prostate cancer with painful bone involvement.  His most recent blood counts show that he remains a good candidate to proceed with Ra-223.  The patient is going to receive Xofigo for his treatment.   Radiation Treatment Planning:  The prescribed radiation activity will be 50 kBq per kg per infusions. The plan is to offer a total of 6 IV administrations of this agent, assuming the blood counts are adequate prior to each administration, with each infusion done at 4 week intervals.  This will be done as an IV administration in the nuclear medicine department, with care to undertake all radiation protection precautions as recommended.   ________________________________  Sheral Apley. Tammi Klippel, M.D.

## 2018-04-22 NOTE — Progress Notes (Signed)
  Radiation Oncology         [phone]) [phone] ________________________________  Name: Eric Deleon MRN: [id number]  Date: 04/20/2018  DOB: Nov 16, 1931   To whom it may concern:   [name] is a patient with metastatic, castrate resistant prostate cancer with predominantly bone disease.  For details of his clinical history, please see the previously detailed consultation report that can be supplied with this letter.  I have recommended Radium 223 Dichloride treatment for monthly IV infusion over six months.  Based upon the randomized phase III Alsympca trial, we would expect an improvement in the patient's overall survival, improved pain control, decreased narcotic requirements, and decreased probability of skeletal related adverse events moving forward.  Given the data and this patient's situation, I feel that the delivery of Trudi Ida is medical necessity.  Please call if any further information is required to aid in this patient's treatment.    Sincerely yours,  Sheral Apley. Tammi Klippel, M.D.   -----------------------------------------------------------------------------------------------------------  Ref:  Zack Seal, Diannia Ruder, et al., Alpha emitter radium-223 and survival in metastatic prostate cancer. [facility] [place] Med. 2013 Jul 18;369(3):213-23

## 2018-04-26 ENCOUNTER — Telehealth: Payer: Self-pay | Admitting: *Deleted

## 2018-04-26 ENCOUNTER — Other Ambulatory Visit: Payer: Self-pay | Admitting: Radiation Oncology

## 2018-04-26 DIAGNOSIS — C7951 Secondary malignant neoplasm of bone: Principal | ICD-10-CM

## 2018-04-26 DIAGNOSIS — C61 Malignant neoplasm of prostate: Secondary | ICD-10-CM

## 2018-04-26 NOTE — Telephone Encounter (Signed)
CALLED PATIENT TO INFORM OF LAB AND WEIGHT ON 05-11-18 @ 12 PM @ [facility]. ON 05-18-18 - ARRIVAL TIME- 10:45 AM @ [facility], SPOKE WITH PATIENT'S WIFE- BARBARA AND SHE ARE AWARE OF THESE APPTS.

## 2018-05-04 ENCOUNTER — Other Ambulatory Visit: Payer: Self-pay | Admitting: Oncology

## 2018-05-04 DIAGNOSIS — C61 Malignant neoplasm of prostate: Secondary | ICD-10-CM

## 2018-05-05 ENCOUNTER — Telehealth: Payer: Self-pay | Admitting: *Deleted

## 2018-05-05 NOTE — Telephone Encounter (Signed)
CALLED PATIENT TO INFORM OF LAB AND WEIGHT BEING MOVED FROM 05-11-18 TO 05-16-18 - @ [facility] AND HIS [place]. BEING MOVED FROM 05-18-18 TO 05-23-18 - ARRIVAL TIME- 11:45 AM @ [facility], SPOKE WITH PATIENT'S WIFE- BARBARA AND SHE IS AWARE OF THESE APPTS.

## 2018-05-09 DIAGNOSIS — C61 Malignant neoplasm of prostate: Secondary | ICD-10-CM | POA: Diagnosis not present

## 2018-05-11 ENCOUNTER — Other Ambulatory Visit: Payer: Self-pay | Admitting: Radiation Oncology

## 2018-05-11 ENCOUNTER — Telehealth: Payer: Self-pay | Admitting: *Deleted

## 2018-05-11 ENCOUNTER — Ambulatory Visit: Payer: Medicare Other

## 2018-05-11 DIAGNOSIS — C7951 Secondary malignant neoplasm of bone: Secondary | ICD-10-CM

## 2018-05-11 DIAGNOSIS — C7952 Secondary malignant neoplasm of bone marrow: Principal | ICD-10-CM

## 2018-05-11 NOTE — Telephone Encounter (Signed)
CALLED PATIENT TO REMIND OF LAB AND WEIGHT APPT. FOR 05-16-18 - ARRIVAL TIME - 11:45 AM @ [facility], SPOKE WITH PATIENT'S WIFE AND SHE IS AWARE OF THIS APPT.

## 2018-05-16 ENCOUNTER — Ambulatory Visit
Admission: RE | Admit: 2018-05-16 | Discharge: 2018-05-16 | Disposition: A | Discharge status: Home / Self Care | Payer: Medicare Other | Source: Ambulatory Visit | Attending: Radiation Oncology | Admitting: Radiation Oncology

## 2018-05-16 DIAGNOSIS — C7952 Secondary malignant neoplasm of bone marrow: Secondary | ICD-10-CM | POA: Insufficient documentation

## 2018-05-16 DIAGNOSIS — C7951 Secondary malignant neoplasm of bone: Secondary | ICD-10-CM | POA: Diagnosis not present

## 2018-05-16 LAB — CBC WITH DIFFERENTIAL (CANCER CENTER ONLY)
Abs Immature Granulocytes: 0.04 10*3/uL (ref 0.00–0.07)
BASOS PCT: 0 %
Basophils Absolute: 0 10*3/uL (ref 0.0–0.1)
Eosinophils Absolute: 0.1 10*3/uL (ref 0.0–0.5)
Eosinophils Relative: 1 %
HCT: 34 % — ABNORMAL LOW (ref 39.0–52.0)
Hemoglobin: 11.2 g/dL — ABNORMAL LOW (ref 13.0–17.0)
Immature Granulocytes: 1 %
Lymphocytes Relative: 12 %
Lymphs Abs: 1.1 10*3/uL (ref 0.7–4.0)
MCH: 30.2 pg (ref 26.0–34.0)
MCHC: 32.9 g/dL (ref 30.0–36.0)
MCV: 91.6 fL (ref 80.0–100.0)
MONO ABS: 0.4 10*3/uL (ref 0.1–1.0)
MONOS PCT: 4 %
NEUTROS PCT: 82 %
Neutro Abs: 7.3 10*3/uL (ref 1.7–7.7)
PLATELETS: 189 10*3/uL (ref 150–400)
RBC: 3.71 MIL/uL — ABNORMAL LOW (ref 4.22–5.81)
RDW: 14.8 % (ref 11.5–15.5)
WBC Count: 8.9 10*3/uL (ref 4.0–10.5)
nRBC: 0 % (ref 0.0–0.2)

## 2018-05-18 ENCOUNTER — Inpatient Hospital Stay (HOSPITAL_COMMUNITY): Admission: RE | Admit: 2018-05-18 | Payer: Medicare Other | Source: Ambulatory Visit

## 2018-05-20 ENCOUNTER — Telehealth: Payer: Self-pay | Admitting: *Deleted

## 2018-05-20 NOTE — Telephone Encounter (Signed)
CALLED PATIENT TO REMIND OF XOFIGO INJ. FOR 05-23-18 - ARRIVAL TIME - 11:45 AM @ [facility], SPOKE WITH PATIENT'S WIFE BARBARA  AND SHE IS AWARE OF THIS INJ.

## 2018-05-23 ENCOUNTER — Other Ambulatory Visit: Payer: Self-pay

## 2018-05-23 ENCOUNTER — Ambulatory Visit (HOSPITAL_COMMUNITY)
Admission: RE | Admit: 2018-05-23 | Discharge: 2018-05-23 | Disposition: A | Discharge status: Home / Self Care | Payer: Medicare Other | Source: Ambulatory Visit | Attending: Radiation Oncology | Admitting: Radiation Oncology

## 2018-05-23 DIAGNOSIS — C7951 Secondary malignant neoplasm of bone: Secondary | ICD-10-CM | POA: Insufficient documentation

## 2018-05-23 DIAGNOSIS — C61 Malignant neoplasm of prostate: Secondary | ICD-10-CM | POA: Insufficient documentation

## 2018-05-23 DIAGNOSIS — G893 Neoplasm related pain (acute) (chronic): Secondary | ICD-10-CM

## 2018-05-23 MED ORDER — RADIUM RA 223 DICHLORIDE 30 MCCI/ML IV SOLN
140.8000 | Freq: Once | INTRAVENOUS | Status: AC
  Administered 2018-05-23: 140.8 via INTRAVENOUS

## 2018-05-23 MED ORDER — HYDROCODONE-ACETAMINOPHEN 5-325 MG PO TABS: 1.0000 | ORAL_TABLET | Freq: Four times a day (QID) | ORAL | 0 refills | Status: DC | PRN

## 2018-05-23 NOTE — Progress Notes (Signed)
  Radiation Oncology         [phone] ________________________________  Name: Eric Deleon MRN: [id number]  Date: 05/23/2018  DOB: Mar 19, 1932  Radium-223 Infusion Note  Diagnosis:  Castration resistant prostate cancer with painful bone involvement  Current Infusion:    1  Planned Infusions:  6  Narrative: Mr. Mahlon Gabrielle presented to nuclear medicine for treatment. His most recent blood counts were reviewed.  He remains a good candidate to proceed with Ra-223.  The patient was situated in an infusion suite with a contact barrier placed under his arm. Intravenous access was established, using sterile technique, and a normal saline infusion from a syringe was started.  Micro-dosimetry:  The prescribed radiation activity was assayed and confirmed to be within specified tolerance.  Special Treatment Procedure - Infusion:  The nuclear medicine technologist and I personally verified the dose activity to be delivered as specified in the written directive, and verified the patient identification via 2 separate methods.  The syringe containing the dose was attached to a 3 way stopcock, and then the valve was opened to the patient, and the dose delivered over a minute. No complications were noted.  The total administered dose was 142.5 microcuries in a volume of 5 cc.   A saline flush of the line and the syringe that contained the isotope was then performed.  The residual radioactivity in the syringe was 3.7 microcuries, so the actual infused isotope activity was 138.8 microcuries.   Pressure was applied to the venipuncture site, and a compression bandage placed.   Radiation Safety personnel were present to perform the discharge survey, as detailed on their documentation.   After a short period of observation, the patient had his IV removed.  Impression:  The patient tolerated his infusion relatively well.  Plan:  The patient will return in one month for ongoing care.      ________________________________  Sheral Apley. Tammi Klippel, M.D.   This document serves as a record of services personally performed by Tyler Pita, MD. It was created on his behalf by Wilburn Mylar, a trained medical scribe. The creation of this record is based on the scribe's personal observations and the provider's statements to them. This document has been checked and approved by the attending provider.

## 2018-05-24 ENCOUNTER — Other Ambulatory Visit: Payer: Self-pay | Admitting: Oncology

## 2018-05-24 DIAGNOSIS — C61 Malignant neoplasm of prostate: Secondary | ICD-10-CM

## 2018-05-25 ENCOUNTER — Inpatient Hospital Stay: Payer: Medicare Other

## 2018-05-25 ENCOUNTER — Telehealth: Payer: Self-pay | Admitting: Oncology

## 2018-05-25 ENCOUNTER — Inpatient Hospital Stay: Payer: Medicare Other | Attending: Oncology | Admitting: Oncology

## 2018-05-25 VITALS — BP 142/71 | HR 72 | Temp 98.1°F | Resp 17 | Ht 72.0 in | Wt 211.5 lb

## 2018-05-25 DIAGNOSIS — M25519 Pain in unspecified shoulder: Secondary | ICD-10-CM | POA: Diagnosis not present

## 2018-05-25 DIAGNOSIS — E291 Testicular hypofunction: Secondary | ICD-10-CM | POA: Diagnosis not present

## 2018-05-25 DIAGNOSIS — C61 Malignant neoplasm of prostate: Secondary | ICD-10-CM | POA: Insufficient documentation

## 2018-05-25 DIAGNOSIS — M25559 Pain in unspecified hip: Secondary | ICD-10-CM | POA: Diagnosis not present

## 2018-05-25 DIAGNOSIS — C7951 Secondary malignant neoplasm of bone: Secondary | ICD-10-CM | POA: Diagnosis not present

## 2018-05-25 DIAGNOSIS — G893 Neoplasm related pain (acute) (chronic): Secondary | ICD-10-CM

## 2018-05-25 LAB — CBC WITH DIFFERENTIAL (CANCER CENTER ONLY)
ABS IMMATURE GRANULOCYTES: 0.03 10*3/uL (ref 0.00–0.07)
BASOS ABS: 0 10*3/uL (ref 0.0–0.1)
Basophils Relative: 0 %
EOS PCT: 1 %
Eosinophils Absolute: 0.1 10*3/uL (ref 0.0–0.5)
HEMATOCRIT: 33.9 % — AB (ref 39.0–52.0)
HEMOGLOBIN: 10.9 g/dL — AB (ref 13.0–17.0)
Immature Granulocytes: 0 %
LYMPHS ABS: 1.1 10*3/uL (ref 0.7–4.0)
Lymphocytes Relative: 13 %
MCH: 30 pg (ref 26.0–34.0)
MCHC: 32.2 g/dL (ref 30.0–36.0)
MCV: 93.4 fL (ref 80.0–100.0)
Monocytes Absolute: 0.4 10*3/uL (ref 0.1–1.0)
Monocytes Relative: 5 %
NEUTROS ABS: 7 10*3/uL (ref 1.7–7.7)
Neutrophils Relative %: 81 %
PLATELETS: 155 10*3/uL (ref 150–400)
RBC: 3.63 MIL/uL — ABNORMAL LOW (ref 4.22–5.81)
RDW: 14.7 % (ref 11.5–15.5)
WBC: 8.6 10*3/uL (ref 4.0–10.5)
nRBC: 0 % (ref 0.0–0.2)

## 2018-05-25 LAB — CMP (CANCER CENTER ONLY)
ALBUMIN: 3.5 g/dL (ref 3.5–5.0)
ALT: 10 U/L (ref 0–44)
ANION GAP: 9 (ref 5–15)
AST: 25 U/L (ref 15–41)
Alkaline Phosphatase: 158 U/L — ABNORMAL HIGH (ref 38–126)
BUN: 18 mg/dL (ref 8–23)
CO2: 25 mmol/L (ref 22–32)
CREATININE: 1.77 mg/dL — AB (ref 0.61–1.24)
Calcium: 8.8 mg/dL — ABNORMAL LOW (ref 8.9–10.3)
Chloride: 104 mmol/L (ref 98–111)
GFR, EST AFRICAN AMERICAN: 40 mL/min — AB (ref >60)
GFR, Estimated: 35 mL/min — ABNORMAL LOW (ref >60)
Glucose, Bld: 107 mg/dL — ABNORMAL HIGH (ref 70–99)
Potassium: 4.4 mmol/L (ref 3.5–5.1)
Sodium: 138 mmol/L (ref 135–145)
TOTAL PROTEIN: 6.5 g/dL (ref 6.5–8.1)
Total Bilirubin: 0.6 mg/dL (ref 0.3–1.2)

## 2018-05-25 NOTE — Telephone Encounter (Signed)
Printed calendar and avs. °

## 2018-05-25 NOTE — Progress Notes (Signed)
Hematology and Oncology Follow Up Visit  Eric Deleon [id number] 29-Nov-1931 84 y.o. 05/25/2018 10:05 AM Eric Deleon, MDPerini, [name], MD   Principle Diagnosis: 84 year old man with castration-resistant prostate cancer with disease to the bone documented in 2013.  He was found to have Gleason score 4+3 = 7 and a PSA for in 2007.     Prior Therapy:  He received a definitive therapy with radiation therapy and developed biochemical relapse initially treated with androgen deprivation.  He developed bony metastasis including a frontal bone metastasis and was treated with radiation therapy in February of 2013. PSA was up to 13.38 despite castration levels of testosterone in 2015 indicating castration resistant disease. Xtandi 160 mg daily started in August of 2015.  Therapy discontinued in May 2019.  Current therapy:   Zytiga 1000 mg daily with prednisone at 5 mg daily.  Xofigo infusion started in December 2019.    Interim History: Eric Deleon presents today for a follow-up.  Since last visit, he reports no major changes in his health.  He received the first infusion of Xofigo on May 23, 2018 without any complications.  He denies any sense of fatigue, nausea or worsening bone pain.  He does report shoulder and hip pain that is manageable with hydrocodone which he takes 2 times at nighttime and none during the day.  His performance status and quality of life remains unchanged.  His appetite is excellent and has gained weight..  He does not report any headaches or blurry vision or double vision.  He denies any dizziness or lethargy.  He does not report any fevers, chills, sweats or appetite changes.  Does not report any chest pain, orthopnea or leg edema.  He does not report any cough, wheezing or hemoptysis.he denies any vomiting or early satiety.  He denies any constipation or diarrhea.  Does not report any frequency urgency or hesitancy.  He denies any worsening bone pain or pathological  fractures.  He does not report any ecchymosis or petechiae..  Does not report any changes.  Remainder of his review of systems is negative.   Medications:   Current Outpatient Medications  Medication Sig Dispense Refill  . abiraterone acetate (ZYTIGA) 250 MG tablet TAKE 4 TABLETS (1,000 MG TOTAL) BY MOUTH DAILY. TAKE ON AN EMPTY STOMACH 1 HOUR BEFORE OR 2 HOURS AFTER A MEAL 120 tablet 0  . allopurinol (ZYLOPRIM) 300 MG tablet Take 300 mg by mouth daily.  3  . atorvastatin (LIPITOR) 20 MG tablet Take 20 mg by mouth daily.  4  . HYDROcodone-acetaminophen (NORCO) 5-325 MG tablet Take 1-2 tablets by mouth every 6 (six) hours as needed for moderate pain. 60 tablet 0  . LORazepam (ATIVAN) 1 MG tablet as directed.  1  . mirtazapine (REMERON) 7.5 MG tablet Take 1 tablet (7.5 mg total) by mouth at bedtime. 90 tablet 0  . omeprazole (PRILOSEC) 20 MG capsule Take 20 mg by mouth daily.    . predniSONE (DELTASONE) 5 MG tablet Take 1 tablet (5 mg total) by mouth daily with breakfast. 90 tablet 3  . promethazine (PHENERGAN) 12.5 MG tablet TAKE 1 TABLET(12.5 MG) BY MOUTH EVERY 6 HOURS AS NEEDED FOR NAUSEA 30 tablet 0  . simethicone (MYLICON) 80 MG chewable tablet Chew 80 mg by mouth every 6 (six) hours as needed.    . Tamsulosin HCl (FLOMAX) 0.4 MG CAPS Take by mouth.    . Vitamin D, Cholecalciferol, 400 UNITS CHEW Chew 800 Units by mouth daily.  No current facility-administered medications for this visit.      Allergies:  No Known Allergies  Past Medical History, Surgical history, Social history, and Family History were reviewed and updated.   Physical Exam:   ECOG: 1   General appearance: Comfortable appearing without any discomfort Head: Normocephalic without any trauma Oropharynx: Mucous membranes are moist and pink without any thrush or ulcers. Eyes: Pupils are equal and round reactive to light. Lymph nodes: No cervical, supraclavicular, inguinal or axillary lymphadenopathy.    Heart:regular rate and rhythm.  S1 and S2 without leg edema. Lung: Clear without any rhonchi or wheezes.  No dullness to percussion. Abdomin: Soft, nontender, nondistended with good bowel sounds.  No hepatosplenomegaly. Musculoskeletal: No joint deformity or effusion.  Full range of motion noted. Neurological: No deficits noted on motor, sensory and deep tendon reflex exam. Skin: No petechial rash or dryness.  Appeared moist.      Lab Results: Lab Results  Component Value Date   WBC 8.9 05/16/2018   HGB 11.2 (L) 05/16/2018   HCT 34.0 (L) 05/16/2018   MCV 91.6 05/16/2018   PLT 189 05/16/2018     Chemistry      Component Value Date/Time   NA 139 04/05/2018 0933   NA 136 04/28/2017 1123   K 4.6 04/05/2018 0933   K 4.6 04/28/2017 1123   CL 103 04/05/2018 0933   CO2 27 04/05/2018 0933   CO2 25 04/28/2017 1123   BUN 24 (H) 04/05/2018 0933   BUN 24.1 04/28/2017 1123   CREATININE 1.57 (H) 04/05/2018 0933   CREATININE 1.8 (H) 04/28/2017 1123      Component Value Date/Time   CALCIUM 9.6 04/05/2018 0933   CALCIUM 9.2 04/28/2017 1123   ALKPHOS 120 04/05/2018 0933   ALKPHOS 81 04/28/2017 1123   AST 19 04/05/2018 0933   AST 14 04/28/2017 1123   ALT 10 04/05/2018 0933   ALT 10 04/28/2017 1123   BILITOT 0.4 04/05/2018 0933   BILITOT 0.36 04/28/2017 1123       Results for IMANOL, BIHL (MRN [id number]) as of 05/25/2018 10:06  Ref. Range 02/17/2018 12:27 04/05/2018 09:33  Prostate Specific Ag, Serum Latest Ref Range: 0.0 - 4.0 ng/mL 129.0 (H) 214.0 (H)     Impression and Plan:  84 year old with:  1.  Castration-resistant prostate cancer with disease to the bone that has progressed on therapy as outlined above.  He is currently on Xofigo which she has tolerated the first infusion without any complications.  I have recommended continuing this therapy to complete 6 months before considering any alternative therapies.  He also has tolerated Zytiga without any complaints.  Risks  and benefits of continuing this medication was reviewed today and for the time being he will continue.  His PSA will continue to rise which is expected at this time and this was discussed with the patient today.   2. Androgen depravation: He continues to receive androgen deprivation therapy under the care of Dr. Diona Fanti without any recent concerns or issues.  3. Bone directed therapy: He is currently on Xgeva without any new complications.  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  4.  Weight loss: Appetite is improving and has gained weight.  5.  Pain: He is currently on hydrocodone and has been effective in treating his pain.  Will be refilled for him appropriately when needed.  6.  Nausea: Resolved at this time.  He does have Phenergan to use  as needed.  7. Followup: In January 2020 to follow his progress.  25 minutes was spent with the patient face-to-face today.  More than 50% of time was dedicated to reviewing his disease status, treatment options and complications related to therapy. Zola Button, MD 12/11/201910:05 AM

## 2018-05-30 LAB — PROSTATE-SPECIFIC AG, SERUM (LABCORP): Prostate Specific Ag, Serum: 315 ng/mL — ABNORMAL HIGH (ref 0.0–4.0)

## 2018-05-31 MED FILL — ABIRATERONE ACETATE 250 MG: 250 | 30 days supply | Qty: 120 | Fill #0

## 2018-06-06 ENCOUNTER — Telehealth: Payer: Self-pay | Admitting: *Deleted

## 2018-06-06 ENCOUNTER — Other Ambulatory Visit (HOSPITAL_COMMUNITY): Payer: Self-pay | Admitting: Radiation Oncology

## 2018-06-06 DIAGNOSIS — C7951 Secondary malignant neoplasm of bone: Principal | ICD-10-CM

## 2018-06-06 DIAGNOSIS — C61 Malignant neoplasm of prostate: Secondary | ICD-10-CM

## 2018-06-06 NOTE — Telephone Encounter (Signed)
CALLED PATIENT TO INFORM OF LAB AND WEIGHT FOR 06-13-18 @ 12 PM AND HIS XOFIGO INJ. ON 06-21-18 - ARRIVAL TIME- 11:45 AM @ [facility], SPOKE WITH PATIENT AND HE IS AWARE OF THESE APPTS.

## 2018-06-14 ENCOUNTER — Other Ambulatory Visit: Payer: Self-pay | Admitting: Radiation Oncology

## 2018-06-14 ENCOUNTER — Ambulatory Visit
Admission: RE | Admit: 2018-06-14 | Discharge: 2018-06-14 | Disposition: A | Discharge status: Home / Self Care | Payer: Medicare Other | Source: Ambulatory Visit | Attending: Radiation Oncology | Admitting: Radiation Oncology

## 2018-06-14 ENCOUNTER — Telehealth: Payer: Self-pay | Admitting: *Deleted

## 2018-06-14 DIAGNOSIS — C61 Malignant neoplasm of prostate: Secondary | ICD-10-CM

## 2018-06-14 DIAGNOSIS — C7951 Secondary malignant neoplasm of bone: Secondary | ICD-10-CM | POA: Diagnosis not present

## 2018-06-14 DIAGNOSIS — C7952 Secondary malignant neoplasm of bone marrow: Secondary | ICD-10-CM | POA: Diagnosis not present

## 2018-06-14 LAB — CMP (CANCER CENTER ONLY)
ALBUMIN: 3.2 g/dL — AB (ref 3.5–5.0)
ALK PHOS: 124 U/L (ref 38–126)
ALT: 13 U/L (ref 0–44)
AST: 23 U/L (ref 15–41)
Anion gap: 11 (ref 5–15)
BUN: 18 mg/dL (ref 8–23)
CALCIUM: 9.2 mg/dL (ref 8.9–10.3)
CO2: 22 mmol/L (ref 22–32)
CREATININE: 1.57 mg/dL — AB (ref 0.61–1.24)
Chloride: 104 mmol/L (ref 98–111)
GFR, EST NON AFRICAN AMERICAN: 40 mL/min — AB (ref >60)
GFR, Est AFR Am: 46 mL/min — ABNORMAL LOW (ref >60)
GLUCOSE: 113 mg/dL — AB (ref 70–99)
Potassium: 4.6 mmol/L (ref 3.5–5.1)
Sodium: 137 mmol/L (ref 135–145)
Total Bilirubin: 0.4 mg/dL (ref 0.3–1.2)
Total Protein: 6.6 g/dL (ref 6.5–8.1)

## 2018-06-14 LAB — CBC WITH DIFFERENTIAL (CANCER CENTER ONLY)
ABS IMMATURE GRANULOCYTES: 0.05 10*3/uL (ref 0.00–0.07)
BASOS PCT: 0 %
Basophils Absolute: 0 10*3/uL (ref 0.0–0.1)
EOS PCT: 1 %
Eosinophils Absolute: 0.1 10*3/uL (ref 0.0–0.5)
HCT: 34.3 % — ABNORMAL LOW (ref 39.0–52.0)
HEMOGLOBIN: 11.2 g/dL — AB (ref 13.0–17.0)
Immature Granulocytes: 1 %
Lymphocytes Relative: 9 %
Lymphs Abs: 0.8 10*3/uL (ref 0.7–4.0)
MCH: 29.5 pg (ref 26.0–34.0)
MCHC: 32.7 g/dL (ref 30.0–36.0)
MCV: 90.3 fL (ref 80.0–100.0)
MONO ABS: 0.5 10*3/uL (ref 0.1–1.0)
MONOS PCT: 6 %
NEUTROS ABS: 8.2 10*3/uL — AB (ref 1.7–7.7)
Neutrophils Relative %: 83 %
PLATELETS: 256 10*3/uL (ref 150–400)
RBC: 3.8 MIL/uL — ABNORMAL LOW (ref 4.22–5.81)
RDW: 13.7 % (ref 11.5–15.5)
WBC Count: 9.7 10*3/uL (ref 4.0–10.5)
nRBC: 0 % (ref 0.0–0.2)

## 2018-06-14 NOTE — Telephone Encounter (Signed)
Called patient to remind of lab and weight appt. For 06-14-18 - arrival time- 12 pm, spoke with patient's wife- Pamala Hurry and she is aware of this appt.

## 2018-06-15 LAB — PROSTATE-SPECIFIC AG, SERUM (LABCORP): Prostate Specific Ag, Serum: 359 ng/mL — ABNORMAL HIGH (ref 0.0–4.0)

## 2018-06-20 ENCOUNTER — Telehealth: Payer: Self-pay | Admitting: *Deleted

## 2018-06-20 NOTE — Telephone Encounter (Signed)
Called patient to remind of Xofigo Inj. for 06-21-18 - arrival time - 11:45 am @ [facility], spoke with patient and he is aware of this inj.

## 2018-06-21 ENCOUNTER — Ambulatory Visit (HOSPITAL_COMMUNITY)
Admission: RE | Admit: 2018-06-21 | Discharge: 2018-06-21 | Disposition: A | Discharge status: Home / Self Care | Payer: Medicare Other | Source: Ambulatory Visit | Attending: Radiation Oncology | Admitting: Radiation Oncology

## 2018-06-21 DIAGNOSIS — C61 Malignant neoplasm of prostate: Secondary | ICD-10-CM | POA: Diagnosis not present

## 2018-06-21 DIAGNOSIS — C7951 Secondary malignant neoplasm of bone: Secondary | ICD-10-CM | POA: Insufficient documentation

## 2018-06-21 MED ORDER — RADIUM RA 223 DICHLORIDE 30 MCCI/ML IV SOLN
133.2500 | Freq: Once | INTRAVENOUS | Status: AC
  Administered 2018-06-21: 133.25 via INTRAVENOUS

## 2018-06-21 NOTE — Progress Notes (Signed)
  Radiation Oncology         [phone] ________________________________  Name: Eric Deleon MRN: [id number]  Date: 06/21/2018  DOB: May 28, 1932  Radium-223 Infusion Note  Diagnosis:  Castration resistant prostate cancer with painful bone involvement  Current Infusion:    2  Planned Infusions:  6  Narrative: Mr. Aundrea Higginbotham presented to nuclear medicine for treatment. His most recent blood counts were reviewed.  He remains a good candidate to proceed with Ra-223.  The patient was situated in an infusion suite with a contact barrier placed under his arm. Intravenous access was established, using sterile technique, and a normal saline infusion from a syringe was started.  Micro-dosimetry:  The prescribed radiation activity was assayed and confirmed to be within specified tolerance.  Special Treatment Procedure - Infusion:  The nuclear medicine technologist and I personally verified the dose activity to be delivered as specified in the written directive, and verified the patient identification via 2 separate methods.  The syringe containing the dose was attached to an intravenous access and the dose delivered over a minute. No complications were noted.  The total administered dose was 136.7 microcuries in a volume of 5 cc.   A saline flush of the line and the syringe that contained the isotope was then performed.  The residual radioactivity in the syringe was 3.45 microcuries, so the actual infused isotope activity was 133.25 microcuries.   Pressure was applied to the venipuncture site, and a compression bandage placed.   Radiation Safety personnel were present to perform the discharge survey, as detailed on their documentation.   After a short period of observation, the patient had his IV removed.  Impression:  The patient tolerated his infusion relatively well.  Plan:  The patient will return in one month for ongoing care.    ________________________________  Sheral Apley. Tammi Klippel, M.D.  This  document serves as a record of services personally performed by Tyler Pita, MD. It was created on his behalf by Rae Lips, a trained medical scribe. The creation of this record is based on the scribe's personal observations and the provider's statements to them. This document has been checked and approved by the attending provider.

## 2018-06-23 ENCOUNTER — Other Ambulatory Visit: Payer: Self-pay | Admitting: Oncology

## 2018-06-23 DIAGNOSIS — C61 Malignant neoplasm of prostate: Secondary | ICD-10-CM

## 2018-06-28 ENCOUNTER — Other Ambulatory Visit: Payer: Self-pay | Admitting: Oncology

## 2018-06-28 ENCOUNTER — Telehealth: Payer: Self-pay | Admitting: *Deleted

## 2018-06-28 DIAGNOSIS — G893 Neoplasm related pain (acute) (chronic): Secondary | ICD-10-CM

## 2018-06-28 MED ORDER — HYDROCODONE-ACETAMINOPHEN 5-325 MG PO TABS: 1.0000 | ORAL_TABLET | Freq: Four times a day (QID) | ORAL | 0 refills | Status: DC | PRN

## 2018-06-28 NOTE — Telephone Encounter (Signed)
Spoke with wife, refill script for norco has been electronically sent to [facility]

## 2018-06-30 MED FILL — ABIRATERONE ACETATE 250 MG: 250 | 30 days supply | Qty: 120 | Fill #0

## 2018-07-01 ENCOUNTER — Telehealth: Payer: Self-pay | Admitting: *Deleted

## 2018-07-01 ENCOUNTER — Other Ambulatory Visit (HOSPITAL_COMMUNITY): Payer: Self-pay | Admitting: Radiation Oncology

## 2018-07-01 DIAGNOSIS — C61 Malignant neoplasm of prostate: Secondary | ICD-10-CM

## 2018-07-01 DIAGNOSIS — C7951 Secondary malignant neoplasm of bone: Principal | ICD-10-CM

## 2018-07-01 NOTE — Telephone Encounter (Signed)
Called patient to inform of lab and weight for 07-19-18 -@ 12 pm @ [facility], and his Xoigo Inj. for 07-26-18 - arrival time - 11:45 am @ [facility], spoke with patient and he is aware of these appts.

## 2018-07-11 ENCOUNTER — Inpatient Hospital Stay: Payer: Medicare Other | Attending: Oncology

## 2018-07-11 ENCOUNTER — Inpatient Hospital Stay (HOSPITAL_BASED_OUTPATIENT_CLINIC_OR_DEPARTMENT_OTHER): Payer: Medicare Other | Admitting: Oncology

## 2018-07-11 ENCOUNTER — Telehealth: Payer: Self-pay | Admitting: Oncology

## 2018-07-11 VITALS — BP 157/80 | HR 91 | Temp 98.4°F | Resp 18 | Ht 72.0 in | Wt 204.5 lb

## 2018-07-11 DIAGNOSIS — R634 Abnormal weight loss: Secondary | ICD-10-CM | POA: Diagnosis not present

## 2018-07-11 DIAGNOSIS — K1120 Sialoadenitis, unspecified: Secondary | ICD-10-CM | POA: Diagnosis not present

## 2018-07-11 DIAGNOSIS — C61 Malignant neoplasm of prostate: Secondary | ICD-10-CM

## 2018-07-11 DIAGNOSIS — C7951 Secondary malignant neoplasm of bone: Secondary | ICD-10-CM

## 2018-07-11 DIAGNOSIS — R630 Anorexia: Secondary | ICD-10-CM

## 2018-07-11 DIAGNOSIS — E291 Testicular hypofunction: Secondary | ICD-10-CM | POA: Insufficient documentation

## 2018-07-11 LAB — CBC WITH DIFFERENTIAL (CANCER CENTER ONLY)
ABS IMMATURE GRANULOCYTES: 0.02 10*3/uL (ref 0.00–0.07)
BASOS PCT: 0 %
Basophils Absolute: 0 10*3/uL (ref 0.0–0.1)
Eosinophils Absolute: 0 10*3/uL (ref 0.0–0.5)
Eosinophils Relative: 1 %
HEMATOCRIT: 32.1 % — AB (ref 39.0–52.0)
HEMOGLOBIN: 10.6 g/dL — AB (ref 13.0–17.0)
Immature Granulocytes: 0 %
Lymphocytes Relative: 8 %
Lymphs Abs: 0.6 10*3/uL — ABNORMAL LOW (ref 0.7–4.0)
MCH: 29.8 pg (ref 26.0–34.0)
MCHC: 33 g/dL (ref 30.0–36.0)
MCV: 90.2 fL (ref 80.0–100.0)
MONO ABS: 0.4 10*3/uL (ref 0.1–1.0)
MONOS PCT: 6 %
Neutro Abs: 5.9 10*3/uL (ref 1.7–7.7)
Neutrophils Relative %: 85 %
Platelet Count: 182 10*3/uL (ref 150–400)
RBC: 3.56 MIL/uL — ABNORMAL LOW (ref 4.22–5.81)
RDW: 14.1 % (ref 11.5–15.5)
WBC Count: 6.9 10*3/uL (ref 4.0–10.5)
nRBC: 0 % (ref 0.0–0.2)

## 2018-07-11 MED ORDER — MIRTAZAPINE 7.5 MG PO TABS: 7.5000 mg | ORAL_TABLET | Freq: Every day | ORAL | 0 refills | Status: AC

## 2018-07-11 NOTE — Progress Notes (Signed)
Hematology and Oncology Follow Up Visit  Eric Deleon [id number] Mar 11, 1932 85 y.o. 07/11/2018 11:04 AM Eric Deleon, MDPerini, [name], MD   Principle Diagnosis: 85 year old man with advanced prostate cancer diagnosed in 2013 after presenting with a Gleason score 4+3 equal 7.  He has castration-resistant disease to the bone.     Prior Therapy:  He received a definitive therapy with radiation therapy and developed biochemical relapse initially treated with androgen deprivation.  He developed bony metastasis including a frontal bone metastasis and was treated with radiation therapy in February of 2013. PSA was up to 13.38 despite castration levels of testosterone in 2015 indicating castration resistant disease. Xtandi 160 mg daily started in August of 2015.  Therapy discontinued in May 2019.  Current therapy:   Zytiga 1000 mg daily with prednisone at 5 mg daily.  Xofigo infusion started in December 2019.  He completed to infusions.    Interim History: Eric Deleon is here for a repeat evaluation.  Since the last visit, he continues to tolerate Xofigo without any new complications.  He also remains on Zytiga without any new complications.  He has reported issues with salivary gland inflammation that requires antibiotics which has hindered his ability to eat and of lost some weight.  He is recovering reasonably well at this time after course of antibiotics.  He is able to swallow without any difficulties.  His performance status and quality of life remains unchanged.  Continues to use hydrocodone without any new complications.  He does not report any headaches or blurry vision or double vision.  He denies any changes in his mentation or lethargy.  He does not report any fevers, chills, sweats or weight loss.  Does not report any chest pain, orthopnea or leg edema.  He does not report any cough, wheezing or hemoptysis.he denies any vomiting or abdominal distention.  He denies any changes in bowel  habits does not report any frequency urgency or hesitancy.  He denies any arthralgias or myalgias.  He does not report any bleeding or clotting tendency.  Does not report any anxiety or depression.  Remainder of his review of systems is negative.   Medications:   Current Outpatient Medications  Medication Sig Dispense Refill  . abiraterone acetate (ZYTIGA) 250 MG tablet TAKE 4 TABLETS (1,000 MG TOTAL) BY MOUTH DAILY. TAKE ON AN EMPTY STOMACH 1 HOUR BEFORE OR 2 HOURS AFTER A MEAL 120 tablet 0  . allopurinol (ZYLOPRIM) 300 MG tablet Take 300 mg by mouth daily.  3  . atorvastatin (LIPITOR) 20 MG tablet Take 20 mg by mouth daily.  4  . HYDROcodone-acetaminophen (NORCO) 5-325 MG tablet Take 1-2 tablets by mouth every 6 (six) hours as needed for moderate pain. 60 tablet 0  . LORazepam (ATIVAN) 1 MG tablet as directed.  1  . mirtazapine (REMERON) 7.5 MG tablet Take 1 tablet (7.5 mg total) by mouth at bedtime. 90 tablet 0  . omeprazole (PRILOSEC) 20 MG capsule Take 20 mg by mouth daily.    . predniSONE (DELTASONE) 5 MG tablet Take 1 tablet (5 mg total) by mouth daily with breakfast. 90 tablet 3  . promethazine (PHENERGAN) 12.5 MG tablet TAKE 1 TABLET(12.5 MG) BY MOUTH EVERY 6 HOURS AS NEEDED FOR NAUSEA 30 tablet 0  . simethicone (MYLICON) 80 MG chewable tablet Chew 80 mg by mouth every 6 (six) hours as needed.    . Tamsulosin HCl (FLOMAX) 0.4 MG CAPS Take by mouth.    . Vitamin D, Cholecalciferol, 400 UNITS  CHEW Chew 800 Units by mouth daily.     No current facility-administered medications for this visit.      Allergies:  No Known Allergies  Past Medical History, Surgical history, Social history, and Family History were reviewed and updated.   Physical Exam:  Blood pressure (!) 157/80, pulse 91, temperature 98.4 F (36.9 C), temperature source Oral, resp. rate 18, height 6' (1.829 m), weight 204 lb 8 oz (92.8 kg), SpO2 100 %.   ECOG: 1   General appearance: Alert, awake without any  distress. Head: Atraumatic without abnormalities.  Slightly enlarged cervical lymph nodes noted. Oropharynx: Without any thrush or ulcers. Eyes: No scleral icterus. Lymph nodes: No lymphadenopathy noted in the cervical, supraclavicular, or axillary nodes Heart:regular rate and rhythm, without any murmurs or gallops.   Lung: Clear to auscultation without any rhonchi, wheezes or dullness to percussion. Abdomin: Soft, nontender without any shifting dullness or ascites. Musculoskeletal: No clubbing or cyanosis. Neurological: No motor or sensory deficits. Skin: No rashes or lesions. Psychiatric: Mood and affect appeared normal.      Lab Results: Lab Results  Component Value Date   WBC 9.7 06/14/2018   HGB 11.2 (L) 06/14/2018   HCT 34.3 (L) 06/14/2018   MCV 90.3 06/14/2018   PLT 256 06/14/2018     Chemistry      Component Value Date/Time   NA 137 06/14/2018 1216   NA 136 04/28/2017 1123   K 4.6 06/14/2018 1216   K 4.6 04/28/2017 1123   CL 104 06/14/2018 1216   CO2 22 06/14/2018 1216   CO2 25 04/28/2017 1123   BUN 18 06/14/2018 1216   BUN 24.1 04/28/2017 1123   CREATININE 1.57 (H) 06/14/2018 1216   CREATININE 1.8 (H) 04/28/2017 1123      Component Value Date/Time   CALCIUM 9.2 06/14/2018 1216   CALCIUM 9.2 04/28/2017 1123   ALKPHOS 124 06/14/2018 1216   ALKPHOS 81 04/28/2017 1123   AST 23 06/14/2018 1216   AST 14 04/28/2017 1123   ALT 13 06/14/2018 1216   ALT 10 04/28/2017 1123   BILITOT 0.4 06/14/2018 1216   BILITOT 0.36 04/28/2017 1123       Results for Eric Deleon (MRN [id number]) as of 07/11/2018 10:45  Ref. Range 05/25/2018 09:48 06/14/2018 12:16  Prostate Specific Ag, Serum Latest Ref Range: 0.0 - 4.0 ng/mL 315.0 (H) 359.0 (H)    Results for Eric Deleon (MRN [id number]) as of 07/11/2018 10:45  Ref. Range 05/25/2018 09:48 06/14/2018 12:16  Alkaline Phosphatase Latest Ref Range: 38 - 126 U/L 158 (H) 124    Impression and Plan:  85 year old with:  1.   Advanced prostate cancer with disease to the bone.  He has castration-resistant disease and has progressed on therapies   He remains on Xofigo and has completed 2 months of therapy without any issues.  He is also on Zytiga without any new concerns.  His PSA continues to rise but his alkaline phosphatase has declined since the start of Xofigo.  Risks and benefits of continuing this approach long-term was discussed today alternative therapies were reviewed which include systemic chemotherapy.  He is agreeable to continue with the current treatment.   2. Androgen depravation: I recommended continuing androgen deprivation therapy which she has been receiving under the care of Dr. Diona Fanti.  3. Bone directed therapy: He continues to receive Xgeva under the care of Dr. Lu Duffel which I recommended continuing.  No issues reported since the last visit  4.  Weight  loss: Related to his salivary gland issues.  I do not believe this is related to cancer exacerbation but will continue to monitor closely.  Prescription for Remeron was refilled for him.  5.  Pain: Hydrocodone is available to him.  This will be refilled in the future as needed.  6.  Nausea: He has Phenergan to use as needed.  Mostly his nausea has improved.  7. Followup: In March 2020   25 minutes was spent with the patient face-to-face today.  More than 50% of time was dedicated to discussing his disease status, treatment options and answering questions regarding future plan of care.  Zola Button, MD 1/27/202011:04 AM

## 2018-07-11 NOTE — Telephone Encounter (Signed)
Gave patient avs report and appointments for March  °

## 2018-07-19 ENCOUNTER — Ambulatory Visit
Admission: RE | Admit: 2018-07-19 | Discharge: 2018-07-19 | Disposition: A | Discharge status: Home / Self Care | Payer: Medicare Other | Source: Ambulatory Visit | Attending: Radiation Oncology | Admitting: Radiation Oncology

## 2018-07-19 ENCOUNTER — Other Ambulatory Visit: Payer: Self-pay | Admitting: Radiation Oncology

## 2018-07-19 ENCOUNTER — Telehealth: Payer: Self-pay | Admitting: *Deleted

## 2018-07-19 DIAGNOSIS — C7951 Secondary malignant neoplasm of bone: Secondary | ICD-10-CM

## 2018-07-19 DIAGNOSIS — C7952 Secondary malignant neoplasm of bone marrow: Secondary | ICD-10-CM | POA: Diagnosis not present

## 2018-07-19 LAB — CBC WITH DIFFERENTIAL (CANCER CENTER ONLY)
Abs Immature Granulocytes: 0.04 10*3/uL (ref 0.00–0.07)
Basophils Absolute: 0 10*3/uL (ref 0.0–0.1)
Basophils Relative: 0 %
Eosinophils Absolute: 0 10*3/uL (ref 0.0–0.5)
Eosinophils Relative: 0 %
HEMATOCRIT: 33.3 % — AB (ref 39.0–52.0)
HEMOGLOBIN: 10.9 g/dL — AB (ref 13.0–17.0)
Immature Granulocytes: 1 %
Lymphocytes Relative: 12 %
Lymphs Abs: 1 10*3/uL (ref 0.7–4.0)
MCH: 29.9 pg (ref 26.0–34.0)
MCHC: 32.7 g/dL (ref 30.0–36.0)
MCV: 91.2 fL (ref 80.0–100.0)
MONOS PCT: 4 %
Monocytes Absolute: 0.3 10*3/uL (ref 0.1–1.0)
Neutro Abs: 6.9 10*3/uL (ref 1.7–7.7)
Neutrophils Relative %: 83 %
Platelet Count: 175 10*3/uL (ref 150–400)
RBC: 3.65 MIL/uL — ABNORMAL LOW (ref 4.22–5.81)
RDW: 14.4 % (ref 11.5–15.5)
WBC Count: 8.3 10*3/uL (ref 4.0–10.5)
nRBC: 0 % (ref 0.0–0.2)

## 2018-07-19 NOTE — Telephone Encounter (Signed)
Called patient to remind of lab and weight for 07-19-18 @ [facility] , no answer.

## 2018-07-22 ENCOUNTER — Other Ambulatory Visit: Payer: Self-pay | Admitting: Oncology

## 2018-07-22 DIAGNOSIS — C61 Malignant neoplasm of prostate: Secondary | ICD-10-CM

## 2018-07-25 ENCOUNTER — Telehealth: Payer: Self-pay | Admitting: *Deleted

## 2018-07-25 NOTE — Telephone Encounter (Signed)
CALLED PATIENT TO REMIND OF XOFIGO INJ. FOR 07-26-18 - ARRIVAL TIME- 11:45 AM @ [facility], SPOKE WITH PATIENT'S WIFE- BARBARA AND SHE IS AWARE OF THIS APPT.

## 2018-07-26 ENCOUNTER — Encounter (HOSPITAL_COMMUNITY)
Admission: RE | Admit: 2018-07-26 | Discharge: 2018-07-26 | Disposition: A | Discharge status: Home / Self Care | Payer: Medicare Other | Source: Ambulatory Visit | Attending: Radiation Oncology | Admitting: Radiation Oncology

## 2018-07-26 DIAGNOSIS — C61 Malignant neoplasm of prostate: Secondary | ICD-10-CM | POA: Diagnosis not present

## 2018-07-26 DIAGNOSIS — C7951 Secondary malignant neoplasm of bone: Secondary | ICD-10-CM | POA: Insufficient documentation

## 2018-07-26 MED ORDER — RADIUM RA 223 DICHLORIDE 30 MCCI/ML IV SOLN
132.0000 | Freq: Once | INTRAVENOUS | Status: AC
  Administered 2018-07-26: 132 via INTRAVENOUS

## 2018-07-26 NOTE — Progress Notes (Signed)
  Radiation Oncology         [phone] ________________________________  Name: Eric Deleon MRN: [id number]  Date: 07/26/2018  DOB: [birth year]  Radium-223 Infusion Note  Diagnosis:  Castration resistant prostate cancer with painful bone involvement  Current Infusion:    3  Planned Infusions:  6  Narrative: Mr. Bowen Kia presented to nuclear medicine for treatment. His most recent blood counts were reviewed.  He remains a good candidate to proceed with Ra-223.  The patient was situated in an infusion suite with a contact barrier placed under his arm. Intravenous access was established, using sterile technique, and a normal saline infusion from a syringe was started.  Micro-dosimetry:  The prescribed radiation activity was assayed and confirmed to be within specified tolerance.  Special Treatment Procedure - Infusion:  The nuclear medicine technologist and I personally verified the dose activity to be delivered as specified in the written directive, and verified the patient identification via 2 separate methods.  The syringe containing the dose was attached to an intravenous access and the dose delivered over a minute. No complications were noted.  The total administered dose was 132.0 microcuries in a volume of 7.23 cc.   A saline flush of the line and the syringe that contained the isotope was then performed.  The residual radioactivity in the syringe was 0.0 microcuries, so the actual infused isotope activity was 132.0 microcuries.   Pressure was applied to the venipuncture site, and a compression bandage placed.   Radiation Safety personnel were present to perform the discharge survey, as detailed on their documentation.   After a short period of observation, the patient had his IV removed.  Impression:  The patient tolerated his infusion relatively well.  Plan:  The patient will return in one month for ongoing care.    ________________________________  Sheral Apley. Tammi Klippel, M.D.  This  document serves as a record of services personally performed by Tyler Pita, MD. It was created on his behalf by Rae Lips, a trained medical scribe. The creation of this record is based on the scribe's personal observations and the provider's statements to them. This document has been checked and approved by the attending provider.

## 2018-07-27 MED FILL — ABIRATERONE ACETATE 250 MG: 250 | 30 days supply | Qty: 120 | Fill #0

## 2018-08-12 ENCOUNTER — Other Ambulatory Visit: Payer: Self-pay | Admitting: Oncology

## 2018-08-12 DIAGNOSIS — G893 Neoplasm related pain (acute) (chronic): Secondary | ICD-10-CM

## 2018-08-12 MED ORDER — HYDROCODONE-ACETAMINOPHEN 5-325 MG PO TABS: 1.0000 | ORAL_TABLET | Freq: Four times a day (QID) | ORAL | 0 refills | Status: DC | PRN

## 2018-08-16 ENCOUNTER — Telehealth: Payer: Self-pay | Admitting: *Deleted

## 2018-08-16 ENCOUNTER — Inpatient Hospital Stay: Payer: Medicare Other | Attending: Oncology | Admitting: Oncology

## 2018-08-16 ENCOUNTER — Other Ambulatory Visit (HOSPITAL_COMMUNITY): Payer: Self-pay | Admitting: Radiation Oncology

## 2018-08-16 ENCOUNTER — Telehealth: Payer: Self-pay | Admitting: Oncology

## 2018-08-16 ENCOUNTER — Inpatient Hospital Stay: Payer: Medicare Other

## 2018-08-16 VITALS — BP 139/80 | HR 98 | Temp 98.3°F | Resp 18 | Ht 72.0 in | Wt 201.8 lb

## 2018-08-16 DIAGNOSIS — M25559 Pain in unspecified hip: Secondary | ICD-10-CM | POA: Insufficient documentation

## 2018-08-16 DIAGNOSIS — C61 Malignant neoplasm of prostate: Secondary | ICD-10-CM | POA: Diagnosis not present

## 2018-08-16 DIAGNOSIS — R110 Nausea: Secondary | ICD-10-CM | POA: Diagnosis not present

## 2018-08-16 DIAGNOSIS — C7951 Secondary malignant neoplasm of bone: Secondary | ICD-10-CM

## 2018-08-16 DIAGNOSIS — R634 Abnormal weight loss: Secondary | ICD-10-CM | POA: Insufficient documentation

## 2018-08-16 DIAGNOSIS — E291 Testicular hypofunction: Secondary | ICD-10-CM | POA: Diagnosis not present

## 2018-08-16 DIAGNOSIS — G893 Neoplasm related pain (acute) (chronic): Secondary | ICD-10-CM

## 2018-08-16 LAB — CBC WITH DIFFERENTIAL (CANCER CENTER ONLY)
Abs Immature Granulocytes: 0.02 10*3/uL (ref 0.00–0.07)
Basophils Absolute: 0 10*3/uL (ref 0.0–0.1)
Basophils Relative: 0 %
Eosinophils Absolute: 0 10*3/uL (ref 0.0–0.5)
Eosinophils Relative: 0 %
HCT: 35 % — ABNORMAL LOW (ref 39.0–52.0)
Hemoglobin: 11.4 g/dL — ABNORMAL LOW (ref 13.0–17.0)
Immature Granulocytes: 0 %
Lymphocytes Relative: 13 %
Lymphs Abs: 0.9 10*3/uL (ref 0.7–4.0)
MCH: 29.9 pg (ref 26.0–34.0)
MCHC: 32.6 g/dL (ref 30.0–36.0)
MCV: 91.9 fL (ref 80.0–100.0)
Monocytes Absolute: 0.4 10*3/uL (ref 0.1–1.0)
Monocytes Relative: 7 %
Neutro Abs: 5.4 10*3/uL (ref 1.7–7.7)
Neutrophils Relative %: 80 %
Platelet Count: 171 10*3/uL (ref 150–400)
RBC: 3.81 MIL/uL — ABNORMAL LOW (ref 4.22–5.81)
RDW: 15 % (ref 11.5–15.5)
WBC: 6.8 10*3/uL (ref 4.0–10.5)
nRBC: 0 % (ref 0.0–0.2)

## 2018-08-16 LAB — CMP (CANCER CENTER ONLY)
ALK PHOS: 104 U/L (ref 38–126)
ALT: 10 U/L (ref 0–44)
AST: 22 U/L (ref 15–41)
Albumin: 3.5 g/dL (ref 3.5–5.0)
Anion gap: 11 (ref 5–15)
BILIRUBIN TOTAL: 0.5 mg/dL (ref 0.3–1.2)
BUN: 16 mg/dL (ref 8–23)
CALCIUM: 9 mg/dL (ref 8.9–10.3)
CO2: 24 mmol/L (ref 22–32)
Chloride: 100 mmol/L (ref 98–111)
Creatinine: 1.54 mg/dL — ABNORMAL HIGH (ref 0.61–1.24)
GFR, EST NON AFRICAN AMERICAN: 41 mL/min — AB (ref >60)
GFR, Est AFR Am: 47 mL/min — ABNORMAL LOW (ref >60)
Glucose, Bld: 108 mg/dL — ABNORMAL HIGH (ref 70–99)
Potassium: 4.7 mmol/L (ref 3.5–5.1)
Sodium: 135 mmol/L (ref 135–145)
Total Protein: 6.7 g/dL (ref 6.5–8.1)

## 2018-08-16 MED ORDER — ONDANSETRON 4 MG PO TBDP: 4.0000 mg | ORAL_TABLET | Freq: Three times a day (TID) | ORAL | 0 refills | Status: AC | PRN

## 2018-08-16 NOTE — Telephone Encounter (Signed)
Gave patient avs report and appointments for April  °

## 2018-08-16 NOTE — Progress Notes (Signed)
Hematology and Oncology Follow Up Visit  Eric Deleon [id number] Feb 02, 1932 85 y.o. 08/16/2018 12:50 PM Eric Deleon, MDPerini, [name], MD   Principle Diagnosis: 85 year old man with castration-resistant prostate cancer with disease to the bone diagnosed in 2013.  He presented with Gleason score 4+3 equal 7 at the time of diagnosis.   Prior Therapy:  He received a definitive therapy with radiation therapy and developed biochemical relapse initially treated with androgen deprivation.  He developed bony metastasis including a frontal bone metastasis and was treated with radiation therapy in February of 2013. PSA was up to 13.38 despite castration levels of testosterone in 2015 indicating castration resistant disease. Xtandi 160 mg daily started in August of 2015.  Therapy discontinued in May 2019.  Current therapy:   Zytiga 1000 mg daily with prednisone at 5 mg daily.  Xofigo infusion started in December 2019.  He is status post 3 infusions with planned 6.    Interim History: Mr. [name] is here for a follow-up visit.  Since the last visit, he reports no major changes in his health.  Continues to tolerate Zytiga without any new complications.  He does report salivary gland infection that requires antibiotics periodically.  He also has tolerated Xofigo without any new complaints.  He does report intermittent hip pain which is manageable with hydrocodone.  He rarely takes multiple doses a day.  He continues to have issues with nausea periodically despite Phenergan use.   Patient denied any alteration mental status, neuropathy, confusion or dizziness.  Denies any headaches or lethargy.  Denies any night sweats, weight loss or changes in appetite.  Denied orthopnea, dyspnea on exertion or chest discomfort.  Denies shortness of breath, difficulty breathing hemoptysis or cough.  Denies any abdominal distention, nausea, early satiety or dyspepsia.  Denies any hematuria, frequency, dysuria or nocturia.   Denies any skin irritation, dryness or rash.  Denies any ecchymosis or petechiae.  Denies any lymphadenopathy or clotting.  Denies any heat or cold intolerance.  Denies any anxiety or depression.  Remaining review of system is negative.     Medications:   Current Outpatient Medications  Medication Sig Dispense Refill  . abiraterone acetate (ZYTIGA) 250 MG tablet TAKE 4 TABLETS (1,000 MG TOTAL) BY MOUTH DAILY. TAKE ON AN EMPTY STOMACH 1 HOUR BEFORE OR 2 HOURS AFTER A MEAL 120 tablet 0  . allopurinol (ZYLOPRIM) 300 MG tablet Take 300 mg by mouth daily.  3  . atorvastatin (LIPITOR) 20 MG tablet Take 20 mg by mouth daily.  4  . HYDROcodone-acetaminophen (NORCO) 5-325 MG tablet Take 1-2 tablets by mouth every 6 (six) hours as needed for moderate pain. 60 tablet 0  . LORazepam (ATIVAN) 1 MG tablet as directed.  1  . mirtazapine (REMERON) 7.5 MG tablet Take 1 tablet (7.5 mg total) by mouth at bedtime. 90 tablet 0  . omeprazole (PRILOSEC) 20 MG capsule Take 20 mg by mouth daily.    . predniSONE (DELTASONE) 5 MG tablet Take 1 tablet (5 mg total) by mouth daily with breakfast. 90 tablet 3  . promethazine (PHENERGAN) 12.5 MG tablet TAKE 1 TABLET(12.5 MG) BY MOUTH EVERY 6 HOURS AS NEEDED FOR NAUSEA 30 tablet 0  . simethicone (MYLICON) 80 MG chewable tablet Chew 80 mg by mouth every 6 (six) hours as needed.    . Tamsulosin HCl (FLOMAX) 0.4 MG CAPS Take by mouth.    . Vitamin D, Cholecalciferol, 400 UNITS CHEW Chew 800 Units by mouth daily.     No current  facility-administered medications for this visit.      Allergies:  No Known Allergies  Past Medical History, Surgical history, Social history, and Family History were reviewed and updated.   Physical Exam:    ECOG: 1   General appearance: Comfortable appearing without any discomfort Head: Normocephalic without any trauma.  Slightly enlarged submental glands on palpation.  No erythema or induration. Oropharynx: Mucous membranes are moist  and pink without any thrush or ulcers. Eyes: Pupils are equal and round reactive to light. Lymph nodes: No cervical, supraclavicular, inguinal or axillary lymphadenopathy.    Heart:regular rate and rhythm.  S1 and S2 without leg edema. Lung: Clear without any rhonchi or wheezes.  No dullness to percussion. Abdomin: Soft, nontender, nondistended with good bowel sounds.  No hepatosplenomegaly. Musculoskeletal: No joint deformity or effusion.  Full range of motion noted. Neurological: No deficits noted on motor, sensory and deep tendon reflex exam. Skin: No petechial rash or dryness.  Appeared moist.        Lab Results: Lab Results  Component Value Date   WBC 8.3 07/19/2018   HGB 10.9 (L) 07/19/2018   HCT 33.3 (L) 07/19/2018   MCV 91.2 07/19/2018   PLT 175 07/19/2018     Chemistry      Component Value Date/Time   NA 137 06/14/2018 1216   NA 136 04/28/2017 1123   K 4.6 06/14/2018 1216   K 4.6 04/28/2017 1123   CL 104 06/14/2018 1216   CO2 22 06/14/2018 1216   CO2 25 04/28/2017 1123   BUN 18 06/14/2018 1216   BUN 24.1 04/28/2017 1123   CREATININE 1.57 (H) 06/14/2018 1216   CREATININE 1.8 (H) 04/28/2017 1123      Component Value Date/Time   CALCIUM 9.2 06/14/2018 1216   CALCIUM 9.2 04/28/2017 1123   ALKPHOS 124 06/14/2018 1216   ALKPHOS 81 04/28/2017 1123   AST 23 06/14/2018 1216   AST 14 04/28/2017 1123   ALT 13 06/14/2018 1216   ALT 10 04/28/2017 1123   BILITOT 0.4 06/14/2018 1216   BILITOT 0.36 04/28/2017 1123       Results for TILTON, MARSALIS (MRN [id number]) as of 08/16/2018 12:50  Ref. Range 05/25/2018 09:48 06/14/2018 12:16  Prostate Specific Ag, Serum Latest Ref Range: 0.0 - 4.0 ng/mL 315.0 (H) 359.0 (H)     Impression and Plan:  85 year old with:  1.  Castration-resistant prostate cancer with disease to the bone.     He is status post 3 months of Xofigo with planned total of 6 months of therapy.  He also remains on Zytiga without any major  complications.  Risks and benefits of continuing this approach was discussed today.  His PSA is showing a slow rise although his alkaline phosphatase is improving.  I recommended finishing this therapy and we will restage him after that.  Different salvage therapy may be needed if he has radiographic progression of disease.   2. Androgen depravation: He continues to receive that under the care of Dr. Diona Fanti.  I recommended continuing this at this time.  3. Bone directed therapy: He is currently on Xgeva which I recommended continuing that under the care of Dr. Diona Fanti.  This was held to see if any improvement in his recurrent salivary gland infection.  This will be resumed in the future.  4.  Weight loss: Appetite is reasonable although he is lost couple pounds.  We will continue to monitor.  5.  Pain: Manageable with hydrocodone which will be refilled  for him periodically.  6.  Nausea: Prescription for Zofran was added to alternate between that and Phenergan.  7. Followup: In 2 months to follow his progress.   25 minutes was spent with the patient face-to-face today.  More than 50% of time was dedicated to reviewing his disease status, addressing complications related to his cancer and cancer therapy and answering questions regarding future plan of care.  Zola Button, MD 3/3/202012:50 PM

## 2018-08-16 NOTE — Telephone Encounter (Signed)
CALLED PATIENT TO INFORM OF LAB AND WEIGHT ON 09-06-18 - @ [place] AND HIS [place]. ON 09-13-18 - ARRIVAL TIME - 11:45 AM @ [facility]

## 2018-08-17 ENCOUNTER — Telehealth: Payer: Self-pay

## 2018-08-17 ENCOUNTER — Other Ambulatory Visit: Payer: Self-pay

## 2018-08-17 ENCOUNTER — Other Ambulatory Visit: Payer: Self-pay | Admitting: Pharmacist

## 2018-08-17 LAB — PROSTATE-SPECIFIC AG, SERUM (LABCORP): Prostate Specific Ag, Serum: 440 ng/mL — ABNORMAL HIGH (ref 0.0–4.0)

## 2018-08-17 MED ORDER — PROCHLORPERAZINE MALEATE 10 MG PO TABS: 10.0000 mg | ORAL_TABLET | Freq: Four times a day (QID) | ORAL | 0 refills | Status: DC | PRN

## 2018-08-17 NOTE — Telephone Encounter (Signed)
Received call from patient spouse stating that they cannot fill the prescription for Zofran because it is requiring prior authorization and they cannot afford the out of pocket cost. Received verbal order for compazine from Dr. Alen Blew which was sent to the pharmacy with confirmed receipt. Contacted patient spouse and explained that Compazine has been prescribed and received by pharmacy and that this should be an effective medication to be used as needed for nausea while the Zofran is going through the prior auth process. She verbalized understanding and appreciative of  assist.

## 2018-08-17 NOTE — Telephone Encounter (Signed)
-----   Message from Wyatt Portela, MD sent at 08/17/2018  8:09 AM EST ----- Please let him know his PSA is up. No changes for now.

## 2018-08-17 NOTE — Telephone Encounter (Signed)
Contacted patient spouse and made aware of PSA results.

## 2018-08-18 ENCOUNTER — Other Ambulatory Visit: Payer: Self-pay | Admitting: Oncology

## 2018-08-18 DIAGNOSIS — C61 Malignant neoplasm of prostate: Secondary | ICD-10-CM

## 2018-08-25 ENCOUNTER — Telehealth: Payer: Self-pay

## 2018-08-25 NOTE — Telephone Encounter (Signed)
Oral Oncology Patient Advocate Encounter  The patient and his daughter showed up today, I met them in the lobby. He brought his income documents for me to make a copy of and signed the application.  We discussed how the manufacturer assistance process works. The patient and his daughter verbalized understanding and great appreciation.  I will fax the application once it is completed.  This encounter will be updated until final determination.  [place] Patient [place] Phone [phone] Fax [phone] 08/25/2018   3:25 PM

## 2018-08-25 NOTE — Telephone Encounter (Signed)
Oral Oncology Patient Advocate Encounter  I received notification from [place] that the [place] is [id number] with the grant. The patients PAF grant has ran out of money and there are no other grants open at this time. I can help the patient apply for manufacturer assistance and if approved, Fabio Asa will be shipped to his home free of charge through [employer] and [employer] representative. I called the patient and explained this to him, he agreed to proceed with the manufacturer assistance application. The patient stated he would have his wife call me back to get the details, let me know when they could come in to bring income documents and sign the application, and to let me know how many pills he currently has on hand.  This encounter will be updated until final determination.  [place] Patient [place] Phone [phone] Fax [phone] 08/25/2018   10:26 AM

## 2018-08-29 NOTE — Telephone Encounter (Signed)
Oral Oncology Patient Advocate Encounter  I faxed the completed application today, 2/48/25.  This encounter will be updated until final determination.  [place] Patient [place] Phone [phone] Fax [phone] 08/29/2018   8:43 AM

## 2018-09-05 ENCOUNTER — Telehealth: Payer: Self-pay | Admitting: *Deleted

## 2018-09-05 NOTE — Telephone Encounter (Signed)
Oral Oncology Patient Advocate Encounter  [employer]y and [employer] needed more information, I faxed this information 09/02/18.  I called today to check on that and they do not have the information loaded into their system yet, this process can take 24-48 hours.  I will call back Tuesday and check on it again.  This encounter will be updated until final determination.   [place] Patient [place] Phone [phone] Fax [phone] 09/05/2018   1:51 PM

## 2018-09-05 NOTE — Telephone Encounter (Signed)
CALLED PATIENT TO REMIND OF LAB AND WEIGHT APPT. FOR 09-06-18, SPOKE WITH PATIENT'S WIFE- BARBARA AND SHE IS AWARE OF THIS APPT.

## 2018-09-06 ENCOUNTER — Other Ambulatory Visit: Payer: Self-pay

## 2018-09-06 ENCOUNTER — Ambulatory Visit
Admission: RE | Admit: 2018-09-06 | Discharge: 2018-09-06 | Disposition: A | Discharge status: Home / Self Care | Payer: Medicare Other | Source: Ambulatory Visit | Attending: Radiation Oncology | Admitting: Radiation Oncology

## 2018-09-06 ENCOUNTER — Other Ambulatory Visit: Payer: Self-pay | Admitting: Radiation Oncology

## 2018-09-06 DIAGNOSIS — C7952 Secondary malignant neoplasm of bone marrow: Secondary | ICD-10-CM | POA: Insufficient documentation

## 2018-09-06 DIAGNOSIS — C7951 Secondary malignant neoplasm of bone: Secondary | ICD-10-CM

## 2018-09-06 LAB — CBC WITH DIFFERENTIAL (CANCER CENTER ONLY)
Abs Immature Granulocytes: 0.02 10*3/uL (ref 0.00–0.07)
Basophils Absolute: 0.1 10*3/uL (ref 0.0–0.1)
Basophils Relative: 1 %
Eosinophils Absolute: 0 10*3/uL (ref 0.0–0.5)
Eosinophils Relative: 0 %
HCT: 36.9 % — ABNORMAL LOW (ref 39.0–52.0)
Hemoglobin: 11.9 g/dL — ABNORMAL LOW (ref 13.0–17.0)
Immature Granulocytes: 0 %
Lymphocytes Relative: 10 %
Lymphs Abs: 0.9 10*3/uL (ref 0.7–4.0)
MCH: 29.8 pg (ref 26.0–34.0)
MCHC: 32.2 g/dL (ref 30.0–36.0)
MCV: 92.5 fL (ref 80.0–100.0)
MONO ABS: 0.5 10*3/uL (ref 0.1–1.0)
Monocytes Relative: 6 %
Neutro Abs: 7.6 10*3/uL (ref 1.7–7.7)
Neutrophils Relative %: 83 %
Platelet Count: 175 10*3/uL (ref 150–400)
RBC: 3.99 MIL/uL — ABNORMAL LOW (ref 4.22–5.81)
RDW: 15.3 % (ref 11.5–15.5)
WBC: 9.1 10*3/uL (ref 4.0–10.5)
nRBC: 0 % (ref 0.0–0.2)

## 2018-09-07 NOTE — Telephone Encounter (Signed)
Oral Oncology Patient Advocate Encounter  Fabio Asa has been approved to be filled through [employer] and [employer] 09/06/18-11/05/18. The patient will need to come in to sign a Medicare Part D attestation form stating that he will spend equal to or more than 4% of his annual income on medicines for the year. Once [employer] receives this they will approve him through 06/15/19. The Fabio Asa will be filled with Wynetta Emery and [employer], [employer] and shipped to the patients home free of charge. The patient will need to call [phone] 7-10 days before his refill is due to schedule his next shipment.  I called the patient and went over this information with him, he verbalized understanding and great appreciation. He will be coming in Tuesday, 3/31 to sign the Medicare attestation form.  [place] Patient [place] Phone [phone] Fax [phone] 09/07/2018   9:16 AM

## 2018-09-12 ENCOUNTER — Telehealth: Payer: Self-pay | Admitting: *Deleted

## 2018-09-12 NOTE — Telephone Encounter (Signed)
CALLED PATIENT TO REMIND OF XOFIGO INJ. FOR 09-13-18 - ARRIVAL TIME- 11:45 AM @ [facility], SPOKE WITH PATIENT'S WIFE- BARBARA AND SHE IS AWARE OF THIS INJ.

## 2018-09-13 ENCOUNTER — Encounter (HOSPITAL_COMMUNITY)
Admission: RE | Admit: 2018-09-13 | Discharge: 2018-09-13 | Disposition: A | Discharge status: Home / Self Care | Payer: Medicare Other | Source: Ambulatory Visit | Attending: Radiation Oncology | Admitting: Radiation Oncology

## 2018-09-13 DIAGNOSIS — C61 Malignant neoplasm of prostate: Secondary | ICD-10-CM | POA: Insufficient documentation

## 2018-09-13 DIAGNOSIS — C7951 Secondary malignant neoplasm of bone: Secondary | ICD-10-CM | POA: Diagnosis not present

## 2018-09-13 MED ORDER — RADIUM RA 223 DICHLORIDE 30 MCCI/ML IV SOLN
133.0000 | Freq: Once | INTRAVENOUS | Status: AC
  Administered 2018-09-13: 133 via INTRAVENOUS

## 2018-09-13 NOTE — Progress Notes (Signed)
  Radiation Oncology         [phone]) [phone] ________________________________  Name: Eric Deleon MRN: [id number]  Date: 09/13/2018  DOB: Apr 07, 1932  Radium-223 Infusion Note  Diagnosis:  Castration resistant prostate cancer with painful bone involvement  Current Infusion:    4  Planned Infusions:  6  Narrative: Mr. Eric Deleon presented to nuclear medicine for treatment. His most recent blood counts were reviewed.  He remains a good candidate to proceed with Ra-223.  The patient was situated in an infusion suite with a contact barrier placed under his arm. Intravenous access was established, using sterile technique, and a normal saline infusion from a syringe was started.  Micro-dosimetry:  The prescribed radiation activity was assayed and confirmed to be within specified tolerance.  Special Treatment Procedure - Infusion:  The nuclear medicine technologist and I personally verified the dose activity to be delivered as specified in the written directive, and verified the patient identification via 2 separate methods.  The syringe containing the dose was attached to an intravenous access and the dose delivered over a minute. No complications were noted.  The total administered dose was 136.5 microcuries in a volume of 7.33 cc.   A saline flush of the line and the syringe that contained the isotope was then performed.  The residual radioactivity in the syringe was 3.5 microcuries, so the actual infused isotope activity was 133.0 microcuries.   Pressure was applied to the venipuncture site, and a compression bandage placed.   Radiation Safety personnel were present to perform the discharge survey, as detailed on their documentation.   After a short period of observation, the patient had his IV removed.  Impression:  The patient tolerated his infusion relatively well.  Plan:  The patient will return in one month for ongoing care.    ________________________________  Sheral Apley. Tammi Klippel, M.D.  This  document serves as a record of services personally performed by Tyler Pita, MD. It was created on his behalf by Wilburn Mylar, a trained medical scribe. The creation of this record is based on the scribe's personal observations and the provider's statements to them. This document has been checked and approved by the attending provider.

## 2018-09-13 NOTE — Telephone Encounter (Signed)
Oral Oncology Patient Advocate Encounter  I met the patients daughter in the lobby to sign the medicare part D attestation form.  I faxed that form to [employer] today [phone]  I received a successful fax notification.  This will approve his Zytiga to be filled with Wynetta Emery and [place] thru 06/15/19.  [place] Patient [place] Phone [phone] Fax [phone] 09/13/2018   12:07 PM

## 2018-09-15 ENCOUNTER — Other Ambulatory Visit: Payer: Self-pay

## 2018-09-15 DIAGNOSIS — C61 Malignant neoplasm of prostate: Secondary | ICD-10-CM

## 2018-09-15 MED ORDER — ABIRATERONE ACETATE 250 MG PO TABS: 1000.0000 mg | ORAL_TABLET | Freq: Every day | ORAL | 0 refills | Status: DC

## 2018-09-16 ENCOUNTER — Other Ambulatory Visit: Payer: Self-pay | Admitting: Oncology

## 2018-09-16 DIAGNOSIS — G893 Neoplasm related pain (acute) (chronic): Secondary | ICD-10-CM

## 2018-09-20 ENCOUNTER — Other Ambulatory Visit (HOSPITAL_COMMUNITY): Payer: Self-pay | Admitting: Radiation Oncology

## 2018-09-20 ENCOUNTER — Telehealth: Payer: Self-pay | Admitting: *Deleted

## 2018-09-20 DIAGNOSIS — C7951 Secondary malignant neoplasm of bone: Principal | ICD-10-CM

## 2018-09-20 DIAGNOSIS — C61 Malignant neoplasm of prostate: Secondary | ICD-10-CM

## 2018-09-20 NOTE — Telephone Encounter (Signed)
CALLED PATIENT TO INFORM OF LAB AND WEIGHT FOR 10-04-18 @ 12 PM @ [place]. ON 10-11-18 - ARRIVAL TIME - 11:45 AM @ [facility], SPOKE WTH PATIENT'S WIFE, BARBARA AND SHE IS AWARE OF THESE APPTS.

## 2018-09-28 ENCOUNTER — Other Ambulatory Visit: Payer: Self-pay

## 2018-09-28 DIAGNOSIS — C61 Malignant neoplasm of prostate: Secondary | ICD-10-CM

## 2018-09-28 MED ORDER — ABIRATERONE ACETATE 250 MG PO TABS: 1000.0000 mg | ORAL_TABLET | Freq: Every day | ORAL | 0 refills | Status: DC

## 2018-10-03 ENCOUNTER — Telehealth: Payer: Self-pay | Admitting: *Deleted

## 2018-10-03 NOTE — Telephone Encounter (Signed)
CALLED PATIENT TO REMIND OF LAB AND WEIGHT FOR 10-04-18, SPOKE WITH PATIENT'S WIFE, BARBARA AND SHE IS AWARE OF THIS APPT.

## 2018-10-04 ENCOUNTER — Other Ambulatory Visit: Payer: Self-pay | Admitting: Radiation Oncology

## 2018-10-04 ENCOUNTER — Ambulatory Visit
Admission: RE | Admit: 2018-10-04 | Discharge: 2018-10-04 | Disposition: A | Discharge status: Home / Self Care | Payer: Medicare Other | Source: Ambulatory Visit | Attending: Radiation Oncology | Admitting: Radiation Oncology

## 2018-10-04 ENCOUNTER — Other Ambulatory Visit: Payer: Self-pay

## 2018-10-04 ENCOUNTER — Telehealth: Payer: Self-pay | Admitting: *Deleted

## 2018-10-04 DIAGNOSIS — C7952 Secondary malignant neoplasm of bone marrow: Secondary | ICD-10-CM | POA: Diagnosis not present

## 2018-10-04 DIAGNOSIS — C7951 Secondary malignant neoplasm of bone: Secondary | ICD-10-CM | POA: Diagnosis not present

## 2018-10-04 LAB — CBC WITH DIFFERENTIAL (CANCER CENTER ONLY)
Abs Immature Granulocytes: 0.03 10*3/uL (ref 0.00–0.07)
Basophils Absolute: 0 10*3/uL (ref 0.0–0.1)
Basophils Relative: 0 %
Eosinophils Absolute: 0 10*3/uL (ref 0.0–0.5)
Eosinophils Relative: 0 %
HCT: 35.4 % — ABNORMAL LOW (ref 39.0–52.0)
Hemoglobin: 11.3 g/dL — ABNORMAL LOW (ref 13.0–17.0)
Immature Granulocytes: 0 %
Lymphocytes Relative: 11 %
Lymphs Abs: 0.9 10*3/uL (ref 0.7–4.0)
MCH: 29.4 pg (ref 26.0–34.0)
MCHC: 31.9 g/dL (ref 30.0–36.0)
MCV: 92.2 fL (ref 80.0–100.0)
Monocytes Absolute: 0.5 10*3/uL (ref 0.1–1.0)
Monocytes Relative: 5 %
Neutro Abs: 7.1 10*3/uL (ref 1.7–7.7)
Neutrophils Relative %: 84 %
Platelet Count: 162 10*3/uL (ref 150–400)
RBC: 3.84 MIL/uL — ABNORMAL LOW (ref 4.22–5.81)
RDW: 14.9 % (ref 11.5–15.5)
WBC Count: 8.5 10*3/uL (ref 4.0–10.5)
nRBC: 0 % (ref 0.0–0.2)

## 2018-10-04 NOTE — Telephone Encounter (Signed)
Called patient to ask question, lvm for a return call 

## 2018-10-10 ENCOUNTER — Other Ambulatory Visit: Payer: Self-pay | Admitting: Oncology

## 2018-10-10 ENCOUNTER — Telehealth: Payer: Self-pay | Admitting: *Deleted

## 2018-10-10 NOTE — Telephone Encounter (Signed)
CALLED PATIENT TO REMIND OF XOFIGO INJ. FOR 10-11-18 - ARRIVAL TIME- 11:45 AM, SPOKE WITH PATIENT'S WIFE- BARBARA AND SHE IS AWARE OF THIS APPT.

## 2018-10-11 ENCOUNTER — Other Ambulatory Visit: Payer: Self-pay

## 2018-10-11 ENCOUNTER — Encounter (HOSPITAL_COMMUNITY)
Admission: RE | Admit: 2018-10-11 | Discharge: 2018-10-11 | Disposition: A | Discharge status: Home / Self Care | Payer: Medicare Other | Source: Ambulatory Visit | Attending: Radiation Oncology | Admitting: Radiation Oncology

## 2018-10-11 DIAGNOSIS — C7951 Secondary malignant neoplasm of bone: Secondary | ICD-10-CM | POA: Insufficient documentation

## 2018-10-11 DIAGNOSIS — C61 Malignant neoplasm of prostate: Secondary | ICD-10-CM | POA: Diagnosis not present

## 2018-10-11 MED ORDER — RADIUM RA 223 DICHLORIDE 30 MCCI/ML IV SOLN
129.3000 | Freq: Once | INTRAVENOUS | Status: AC | PRN
  Administered 2018-10-11: 129.3 via INTRAVENOUS

## 2018-10-11 NOTE — Progress Notes (Signed)
  Radiation Oncology         [phone] ________________________________  Name: Eric Deleon MRN: [id number]  Date: 10/11/2018  DOB: [birth year]  Radium-223 Infusion Note  Diagnosis:  Castration resistant prostate cancer with painful bone involvement  Current Infusion:    5  Planned Infusions:  6  Narrative: Mr. Eric Deleon presented to nuclear medicine for treatment. His most recent blood counts were reviewed.  He remains a good candidate to proceed with Ra-223.  The patient was situated in an infusion suite with a contact barrier placed under his arm. Intravenous access was established, using sterile technique, and a normal saline infusion from a syringe was started.  Micro-dosimetry:  The prescribed radiation activity was assayed and confirmed to be within specified tolerance.  Special Treatment Procedure - Infusion:  The nuclear medicine technologist and I personally verified the dose activity to be delivered as specified in the written directive, and verified the patient identification via 2 separate methods.  The syringe containing the dose was attached to an intravenous access and the dose delivered over a minute. No complications were noted.  The total administered dose was 132.0 microcuries in a volume of 7.1 cc.   A saline flush of the line and the syringe that contained the isotope was then performed.  The residual radioactivity in the syringe was 2.69 microcuries, so the actual infused isotope activity was 129.31 microcuries.   Pressure was applied to the venipuncture site, and a compression bandage placed.   Radiation Safety personnel were present to perform the discharge survey, as detailed on their documentation.   After a short period of observation, the patient had his IV removed.  Impression:  The patient tolerated his infusion relatively well.  Plan:  The patient will return in one month for ongoing care.    ________________________________  Sheral Apley. Tammi Klippel, M.D.   This document serves as a record of services personally performed by Tyler Pita, MD. It was created on his behalf by Rae Lips, a trained medical scribe. The creation of this record is based on the scribe's personal observations and the provider's statements to them. This document has been checked and approved by the attending provider.

## 2018-10-12 ENCOUNTER — Inpatient Hospital Stay: Payer: Medicare Other

## 2018-10-12 ENCOUNTER — Other Ambulatory Visit: Payer: Self-pay

## 2018-10-12 ENCOUNTER — Telehealth: Payer: Self-pay | Admitting: Oncology

## 2018-10-12 ENCOUNTER — Inpatient Hospital Stay: Payer: Medicare Other | Attending: Oncology | Admitting: Oncology

## 2018-10-12 VITALS — BP 124/69 | HR 95 | Temp 98.1°F | Resp 18 | Ht 72.0 in | Wt 196.1 lb

## 2018-10-12 DIAGNOSIS — C61 Malignant neoplasm of prostate: Secondary | ICD-10-CM

## 2018-10-12 DIAGNOSIS — C7951 Secondary malignant neoplasm of bone: Secondary | ICD-10-CM | POA: Insufficient documentation

## 2018-10-12 DIAGNOSIS — R110 Nausea: Secondary | ICD-10-CM | POA: Diagnosis not present

## 2018-10-12 DIAGNOSIS — G893 Neoplasm related pain (acute) (chronic): Secondary | ICD-10-CM

## 2018-10-12 DIAGNOSIS — R634 Abnormal weight loss: Secondary | ICD-10-CM | POA: Diagnosis not present

## 2018-10-12 DIAGNOSIS — M25559 Pain in unspecified hip: Secondary | ICD-10-CM | POA: Diagnosis not present

## 2018-10-12 DIAGNOSIS — Z79899 Other long term (current) drug therapy: Secondary | ICD-10-CM | POA: Diagnosis not present

## 2018-10-12 DIAGNOSIS — E291 Testicular hypofunction: Secondary | ICD-10-CM | POA: Diagnosis not present

## 2018-10-12 LAB — CMP (CANCER CENTER ONLY)
ALT: 14 U/L (ref 0–44)
AST: 31 U/L (ref 15–41)
Albumin: 3.6 g/dL (ref 3.5–5.0)
Alkaline Phosphatase: 120 U/L (ref 38–126)
Anion gap: 8 (ref 5–15)
BUN: 18 mg/dL (ref 8–23)
CO2: 26 mmol/L (ref 22–32)
Calcium: 9.2 mg/dL (ref 8.9–10.3)
Chloride: 101 mmol/L (ref 98–111)
Creatinine: 1.55 mg/dL — ABNORMAL HIGH (ref 0.61–1.24)
GFR, Est AFR Am: 47 mL/min — ABNORMAL LOW (ref >60)
GFR, Estimated: 41 mL/min — ABNORMAL LOW (ref >60)
Glucose, Bld: 110 mg/dL — ABNORMAL HIGH (ref 70–99)
Potassium: 4.2 mmol/L (ref 3.5–5.1)
Sodium: 135 mmol/L (ref 135–145)
Total Bilirubin: 0.4 mg/dL (ref 0.3–1.2)
Total Protein: 6.6 g/dL (ref 6.5–8.1)

## 2018-10-12 LAB — CBC WITH DIFFERENTIAL (CANCER CENTER ONLY)
Abs Immature Granulocytes: 0.02 10*3/uL (ref 0.00–0.07)
Basophils Absolute: 0 10*3/uL (ref 0.0–0.1)
Basophils Relative: 0 %
Eosinophils Absolute: 0 10*3/uL (ref 0.0–0.5)
Eosinophils Relative: 1 %
HCT: 33.5 % — ABNORMAL LOW (ref 39.0–52.0)
Hemoglobin: 11 g/dL — ABNORMAL LOW (ref 13.0–17.0)
Immature Granulocytes: 0 %
Lymphocytes Relative: 12 %
Lymphs Abs: 0.9 10*3/uL (ref 0.7–4.0)
MCH: 29.6 pg (ref 26.0–34.0)
MCHC: 32.8 g/dL (ref 30.0–36.0)
MCV: 90.3 fL (ref 80.0–100.0)
Monocytes Absolute: 0.5 10*3/uL (ref 0.1–1.0)
Monocytes Relative: 6 %
Neutro Abs: 6.5 10*3/uL (ref 1.7–7.7)
Neutrophils Relative %: 81 %
Platelet Count: 195 10*3/uL (ref 150–400)
RBC: 3.71 MIL/uL — ABNORMAL LOW (ref 4.22–5.81)
RDW: 14.6 % (ref 11.5–15.5)
WBC Count: 8 10*3/uL (ref 4.0–10.5)
nRBC: 0 % (ref 0.0–0.2)

## 2018-10-12 MED ORDER — HYDROCODONE-ACETAMINOPHEN 5-325 MG PO TABS: 1.0000 | ORAL_TABLET | Freq: Four times a day (QID) | ORAL | 0 refills | Status: DC | PRN

## 2018-10-12 NOTE — Telephone Encounter (Signed)
Scheduled appt per 4/29 sch message. ° °A calendar will be mailed out. °

## 2018-10-12 NOTE — Progress Notes (Signed)
Hematology and Oncology Follow Up Visit  Eric Deleon [id number] 06-Oct-1931 85 y.o. 10/12/2018 12:17 PM Eric Deleon, MDPerini, [name], MD   Principle Diagnosis: 85 year old man with advanced prostate cancer and disease to the bone diagnosed in 2013.  He has castration-resistant after presenting with Gleason score 4+3 equal 7 at that time.   Prior Therapy:  He received a definitive therapy with radiation therapy and developed biochemical relapse initially treated with androgen deprivation.  He developed bony metastasis including a frontal bone metastasis and was treated with radiation therapy in February of 2013. PSA was up to 13.38 despite castration levels of testosterone in 2015 indicating castration resistant disease. Xtandi 160 mg daily started in August of 2015.  Therapy discontinued in May 2019.  Current therapy:   Zytiga 1000 mg daily with prednisone at 5 mg daily.  Xofigo infusion started in December 2019.  He is status post 5 infusions.  He is planned to have 6 treatments.    Interim History: Eric Deleon returns today for a repeat evaluation.  Since the last visit, he reports a few complaints including some mild nausea and hip pain.  Both are manageable with the current medication he is taking.  He takes hydrocodone which is has been effective in controlling his pain.  He also uses Phenergan gel to help with his nausea.  He denies any vomiting or changes in bowel habits.  His performance status and quality of life remains stable at this time.  He has tolerated Xofigo infusion without any issues.  Zytiga remains as well without any issues.  He denies any lower extremity edema or excessive fatigue.  He denied headaches, blurry vision, syncope or seizures.  Denies any fevers, chills or sweats.  Denied chest pain, palpitation, orthopnea or leg edema.  Denied cough, wheezing or hemoptysis.  Denied nausea, vomiting or abdominal pain.  Denies any constipation or diarrhea.  Denies any frequency  urgency or hesitancy.  Denies any arthralgias or myalgias.  Denies any skin rashes or lesions.  Denies any bleeding or clotting tendency.  Denies any easy bruising.  Denies any hair or nail changes.  Denies any anxiety or depression.  Remaining review of system is negative.        Medications:   Current Outpatient Medications  Medication Sig Dispense Refill  . abiraterone acetate (ZYTIGA) 250 MG tablet Take 4 tablets (1,000 mg total) by mouth daily. Take on an empty stomach 1 hour before or 2 hours after a meal 120 tablet 0  . allopurinol (ZYLOPRIM) 300 MG tablet Take 300 mg by mouth daily.  3  . atorvastatin (LIPITOR) 20 MG tablet Take 20 mg by mouth daily.  4  . HYDROcodone-acetaminophen (NORCO/VICODIN) 5-325 MG tablet TAKE 1-2 TABLETS EVERY 6 HOURS AS NEEDED FOR MODERATE PAIN. 60 tablet 0  . LORazepam (ATIVAN) 1 MG tablet as directed.  1  . mirtazapine (REMERON) 7.5 MG tablet Take 1 tablet (7.5 mg total) by mouth at bedtime. 90 tablet 0  . omeprazole (PRILOSEC) 20 MG capsule Take 20 mg by mouth daily.    . ondansetron (ZOFRAN ODT) 4 MG disintegrating tablet Take 1 tablet (4 mg total) by mouth every 8 (eight) hours as needed for nausea or vomiting. 20 tablet 0  . predniSONE (DELTASONE) 5 MG tablet TAKE 1 TABLET(5 MG) BY MOUTH DAILY WITH BREAKFAST 90 tablet 3  . prochlorperazine (COMPAZINE) 10 MG tablet TAKE 1 TABLET(10 MG) BY MOUTH EVERY 6 HOURS AS NEEDED FOR NAUSEA OR VOMITING 30 tablet 0  .  promethazine (PHENERGAN) 12.5 MG tablet TAKE 1 TABLET(12.5 MG) BY MOUTH EVERY 6 HOURS AS NEEDED FOR NAUSEA 30 tablet 0  . simethicone (MYLICON) 80 MG chewable tablet Chew 80 mg by mouth every 6 (six) hours as needed.    . Tamsulosin HCl (FLOMAX) 0.4 MG CAPS Take by mouth.    . Vitamin D, Cholecalciferol, 400 UNITS CHEW Chew 800 Units by mouth daily.     No current facility-administered medications for this visit.      Allergies:  No Known Allergies  Past Medical History, Surgical history,  Social history, and Family History were reviewed and updated.   Physical Exam:  Blood pressure 124/69, pulse 95, temperature 98.1 F (36.7 C), temperature source Oral, resp. rate 18, height 6' (1.829 m), weight 196 lb 1.6 oz (89 kg), SpO2 99 %.    ECOG: 1    General appearance: Alert, awake without any distress. Head: Atraumatic without abnormalities Oropharynx: Without any thrush or ulcers. Eyes: No scleral icterus. Lymph nodes: No lymphadenopathy noted in the cervical, supraclavicular, or axillary nodes Heart:regular rate and rhythm, without any murmurs or gallops.   Lung: Clear to auscultation without any rhonchi, wheezes or dullness to percussion. Abdomin: Soft, nontender without any shifting dullness or ascites. Musculoskeletal: No clubbing or cyanosis. Neurological: No motor or sensory deficits. Skin: No rashes or lesions. Psychiatric: Mood and affect appeared normal.       Lab Results: Lab Results  Component Value Date   WBC 8.0 10/12/2018   HGB 11.0 (L) 10/12/2018   HCT 33.5 (L) 10/12/2018   MCV 90.3 10/12/2018   PLT 195 10/12/2018     Chemistry      Component Value Date/Time   NA 135 08/16/2018 1241   NA 136 04/28/2017 1123   K 4.7 08/16/2018 1241   K 4.6 04/28/2017 1123   CL 100 08/16/2018 1241   CO2 24 08/16/2018 1241   CO2 25 04/28/2017 1123   BUN 16 08/16/2018 1241   BUN 24.1 04/28/2017 1123   CREATININE 1.54 (H) 08/16/2018 1241   CREATININE 1.8 (H) 04/28/2017 1123      Component Value Date/Time   CALCIUM 9.0 08/16/2018 1241   CALCIUM 9.2 04/28/2017 1123   ALKPHOS 104 08/16/2018 1241   ALKPHOS 81 04/28/2017 1123   AST 22 08/16/2018 1241   AST 14 04/28/2017 1123   ALT 10 08/16/2018 1241   ALT 10 04/28/2017 1123   BILITOT 0.5 08/16/2018 1241   BILITOT 0.36 04/28/2017 1123       Results for Eric, Deleon (MRN [id number]) as of 08/16/2018 12:50  Ref. Range 05/25/2018 09:48 06/14/2018 12:16  Prostate Specific Ag, Serum Latest Ref Range: 0.0  - 4.0 ng/mL 315.0 (H) 359.0 (H)     Impression and Plan:  85 year old with:  1.  Advanced prostate cancer that is currently castration-resistant with disease to the bone.   He continues to tolerate Zytiga without any major complaints although his PSA is rising slowly.  He is also receiving Xofigo without any major complications.  The natural course of his disease was updated today as well as future treatment options were discussed.  I have recommended finishing Xofigo treatments and consideration for different salvage therapy after that.  Systemic chemotherapy is the only option remaining if he has disease progression beyond the current treatment.   2. Androgen depravation: I recommended continuing this indefinitely.  He is currently receiving under the care of Dr. Diona Fanti.  3. Bone directed therapy: He has been on [employer]  in the past which has been held at this time.  4.  Weight loss: We have discussed strategies to boost his appetite including nutritional supplements in controlling his nausea.  5.  Pain: Predominantly in his hip which is manageable with hydrocodone which was refilled for him.  6.  Nausea: He has Phenergan to use periodically as well as Zofran.  7. Followup: In June 2020 after completing a Xofigo treatment.   25 minutes was spent with the patient face-to-face today.  More than 50% of time was spent on reviewing his disease status, treatment options and complications related therapy  Zola Button, MD 4/29/202012:17 PM

## 2018-10-13 ENCOUNTER — Telehealth: Payer: Self-pay

## 2018-10-13 LAB — PROSTATE-SPECIFIC AG, SERUM (LABCORP): Prostate Specific Ag, Serum: 558 ng/mL — ABNORMAL HIGH (ref 0.0–4.0)

## 2018-10-13 NOTE — Telephone Encounter (Signed)
-----   Message from Wyatt Portela, MD sent at 10/13/2018 10:08 AM EDT ----- Please let him know his PSA is up. No changes for now.

## 2018-10-13 NOTE — Telephone Encounter (Signed)
Contacted patient and made the spouse aware of the PSA result.

## 2018-10-19 ENCOUNTER — Other Ambulatory Visit: Payer: Self-pay | Admitting: Oncology

## 2018-10-19 DIAGNOSIS — C61 Malignant neoplasm of prostate: Secondary | ICD-10-CM

## 2018-10-24 ENCOUNTER — Other Ambulatory Visit: Payer: Self-pay | Admitting: Oncology

## 2018-10-24 DIAGNOSIS — G893 Neoplasm related pain (acute) (chronic): Secondary | ICD-10-CM

## 2018-10-24 MED ORDER — HYDROCODONE-ACETAMINOPHEN 5-325 MG PO TABS: 1.0000 | ORAL_TABLET | Freq: Four times a day (QID) | ORAL | 0 refills | Status: DC | PRN

## 2018-10-25 ENCOUNTER — Telehealth: Payer: Self-pay | Admitting: *Deleted

## 2018-10-25 ENCOUNTER — Other Ambulatory Visit (HOSPITAL_COMMUNITY): Payer: Self-pay | Admitting: Radiation Oncology

## 2018-10-25 DIAGNOSIS — C61 Malignant neoplasm of prostate: Secondary | ICD-10-CM

## 2018-10-25 NOTE — Telephone Encounter (Signed)
CALLED PATIENT TO INFORM OF LAB AND WEIGHT FOR 11-01-18- ARRIVAL TIME- 11:45 AM @ [facility]. ON 11-08-18 - ARRIVAL TIME- 11:45 AM @ [facility], SPOKE WITH PATIENT'S WIFE- BARBARA AND SHE IS AWARE OF THESE APPTS.

## 2018-10-31 ENCOUNTER — Other Ambulatory Visit: Payer: Self-pay | Admitting: Radiation Oncology

## 2018-10-31 ENCOUNTER — Telehealth: Payer: Self-pay | Admitting: *Deleted

## 2018-10-31 DIAGNOSIS — C7951 Secondary malignant neoplasm of bone: Secondary | ICD-10-CM

## 2018-10-31 NOTE — Telephone Encounter (Signed)
CALLED PATIENT TO REMIND OF LAB AND WEIGHT FOR 11-01-18- ARRIVAL TIME- 11:45 AM @ [facility], SPOKE WITH PATIENT'S WIFE- BARBARA AND SHE IS AWARE OF THIS APPT.

## 2018-11-01 ENCOUNTER — Ambulatory Visit
Admission: RE | Admit: 2018-11-01 | Discharge: 2018-11-01 | Disposition: A | Discharge status: Home / Self Care | Payer: Medicare Other | Source: Ambulatory Visit | Attending: Radiation Oncology | Admitting: Radiation Oncology

## 2018-11-01 ENCOUNTER — Other Ambulatory Visit: Payer: Self-pay

## 2018-11-01 DIAGNOSIS — C7952 Secondary malignant neoplasm of bone marrow: Secondary | ICD-10-CM | POA: Insufficient documentation

## 2018-11-01 DIAGNOSIS — C7951 Secondary malignant neoplasm of bone: Secondary | ICD-10-CM

## 2018-11-01 LAB — CBC WITH DIFFERENTIAL (CANCER CENTER ONLY)
Abs Immature Granulocytes: 0.02 10*3/uL (ref 0.00–0.07)
Basophils Absolute: 0 10*3/uL (ref 0.0–0.1)
Basophils Relative: 0 %
Eosinophils Absolute: 0 10*3/uL (ref 0.0–0.5)
Eosinophils Relative: 0 %
HCT: 32.5 % — ABNORMAL LOW (ref 39.0–52.0)
Hemoglobin: 10.6 g/dL — ABNORMAL LOW (ref 13.0–17.0)
Immature Granulocytes: 0 %
Lymphocytes Relative: 12 %
Lymphs Abs: 0.8 10*3/uL (ref 0.7–4.0)
MCH: 29.2 pg (ref 26.0–34.0)
MCHC: 32.6 g/dL (ref 30.0–36.0)
MCV: 89.5 fL (ref 80.0–100.0)
Monocytes Absolute: 0.5 10*3/uL (ref 0.1–1.0)
Monocytes Relative: 7 %
Neutro Abs: 5.4 10*3/uL (ref 1.7–7.7)
Neutrophils Relative %: 81 %
Platelet Count: 230 10*3/uL (ref 150–400)
RBC: 3.63 MIL/uL — ABNORMAL LOW (ref 4.22–5.81)
RDW: 14.4 % (ref 11.5–15.5)
WBC Count: 6.7 10*3/uL (ref 4.0–10.5)
nRBC: 0 % (ref 0.0–0.2)

## 2018-11-02 ENCOUNTER — Other Ambulatory Visit: Payer: Self-pay | Admitting: Oncology

## 2018-11-02 ENCOUNTER — Telehealth: Payer: Self-pay | Admitting: *Deleted

## 2018-11-02 DIAGNOSIS — G893 Neoplasm related pain (acute) (chronic): Secondary | ICD-10-CM

## 2018-11-02 MED ORDER — HYDROCODONE-ACETAMINOPHEN 5-325 MG PO TABS: 1.0000 | ORAL_TABLET | Freq: Four times a day (QID) | ORAL | 0 refills | Status: DC | PRN

## 2018-11-02 NOTE — Telephone Encounter (Signed)
Wife called regarding hydrocodone. Patient has been taking 2 tablets when he has pain. Most recent prescription states 1 tablet every 6 hours. Needs a refill as he will run out this weekend. [employer].

## 2018-11-02 NOTE — Telephone Encounter (Signed)
Done

## 2018-11-04 ENCOUNTER — Telehealth: Payer: Self-pay | Admitting: *Deleted

## 2018-11-04 NOTE — Telephone Encounter (Signed)
Called patient to remind of Xofigo Inj. for 11-08-18 - arrival time- 11:45 am @ [facility], spoke with patient and he is aware of this injection

## 2018-11-08 ENCOUNTER — Ambulatory Visit (HOSPITAL_COMMUNITY)
Admission: RE | Admit: 2018-11-08 | Discharge: 2018-11-08 | Disposition: A | Discharge status: Home / Self Care | Payer: Medicare Other | Source: Ambulatory Visit | Attending: Radiation Oncology | Admitting: Radiation Oncology

## 2018-11-08 ENCOUNTER — Other Ambulatory Visit: Payer: Self-pay

## 2018-11-08 DIAGNOSIS — C7951 Secondary malignant neoplasm of bone: Secondary | ICD-10-CM | POA: Insufficient documentation

## 2018-11-08 DIAGNOSIS — C61 Malignant neoplasm of prostate: Secondary | ICD-10-CM | POA: Diagnosis not present

## 2018-11-08 MED ORDER — RADIUM RA 223 DICHLORIDE 30 MCCI/ML IV SOLN
118.2000 | Freq: Once | INTRAVENOUS | Status: AC | PRN
  Administered 2018-11-08: 118.2 via INTRAVENOUS

## 2018-11-08 NOTE — Progress Notes (Signed)
  Radiation Oncology         [phone] ________________________________  Name: Julious Langlois MRN: [id number]  Date: 11/08/2018  DOB: [birth year]  Radium-223 Infusion Note  Diagnosis:  Castration resistant prostate cancer with painful bone involvement  Current Infusion:    6  Planned Infusions:  6  Narrative: Mr. Yao Hyppolite presented to nuclear medicine for treatment. His most recent blood counts were reviewed.  He remains a good candidate to proceed with Ra-223.  The patient was situated in an infusion suite with a contact barrier placed under his arm. Intravenous access was established, using sterile technique, and a normal saline infusion from a syringe was started.  Micro-dosimetry:  The prescribed radiation activity was assayed and confirmed to be within specified tolerance.  Special Treatment Procedure - Infusion:  The nuclear medicine technologist and I personally verified the dose activity to be delivered as specified in the written directive, and verified the patient identification via 2 separate methods.  The syringe containing the dose was attached to an intravenous access and the dose delivered over a minute. No complications were noted.  The total administered dose was 121.3 microcuries in a volume of 5 cc.   A saline flush of the line and the syringe that contained the isotope was then performed.  The residual radioactivity in the syringe was 3.1 microcuries, so the actual infused isotope activity was 118.2 microcuries.   Pressure was applied to the venipuncture site, and a compression bandage placed.   Radiation Safety personnel were present to perform the discharge survey, as detailed on their documentation.   After a short period of observation, the patient had his IV removed.  Impression:  The patient tolerated his infusion relatively well.  Plan:  The patient will continue to follow with Dr. Alen Blew and Dr. Radford Pax for ongoing care.    ________________________________   Sheral Apley. Tammi Klippel, M.D.

## 2018-11-11 ENCOUNTER — Other Ambulatory Visit: Payer: Self-pay | Admitting: Oncology

## 2018-11-11 DIAGNOSIS — G893 Neoplasm related pain (acute) (chronic): Secondary | ICD-10-CM

## 2018-11-11 MED ORDER — HYDROCODONE-ACETAMINOPHEN 5-325 MG PO TABS: 1.0000 | ORAL_TABLET | Freq: Four times a day (QID) | ORAL | 0 refills | Status: DC | PRN

## 2018-11-14 ENCOUNTER — Other Ambulatory Visit: Payer: Self-pay | Admitting: Oncology

## 2018-11-18 ENCOUNTER — Telehealth: Payer: Self-pay

## 2018-11-18 NOTE — Telephone Encounter (Signed)
Received message from patient daughter Coralyn Mark requesting return call. Returned call and left message for [employer].

## 2018-11-21 ENCOUNTER — Other Ambulatory Visit: Payer: Self-pay | Admitting: Oncology

## 2018-11-21 ENCOUNTER — Telehealth: Payer: Self-pay

## 2018-11-21 MED ORDER — OXYCODONE HCL 5 MG PO TABS: 5.0000 mg | ORAL_TABLET | ORAL | 0 refills | Status: DC | PRN

## 2018-11-21 NOTE — Telephone Encounter (Signed)
Scheduling message sent for lab appointment on 6/15 and to change 6/16 visit to a Webex. Patient daughter Coralyn Mark aware to expect a call from scheduling.

## 2018-11-21 NOTE — Telephone Encounter (Signed)
-----   Message from Wyatt Portela, MD sent at 11/18/2018  4:30 PM EDT ----- Regarding: RE: appt concerns Yes. That would be fine. Lab the day before a webex visit on 6/16. Thanks.  ----- Message ----- From: Scot Dock, RN Sent: 11/18/2018   4:19 PM EDT To: Tami Lin, RN, Wyatt Portela, MD Subject: appt concerns                                  Daughter Coralyn Mark called and is requesting to change patient appt on the 16th to a Webex so she and the patient spouse can participate. She stated that the patient has had some decline and they would like to be a part of the visit to assist with comprehension and decision making moving forward. If the appt is to be changed we can get the lab appt rescheduled to the previous day. Please advise.

## 2018-11-23 ENCOUNTER — Telehealth: Payer: Self-pay | Admitting: Oncology

## 2018-11-23 NOTE — Telephone Encounter (Signed)
Changed appt to webex per sch msg.called and spoke with patients wife. Confirmed change in appts

## 2018-11-25 ENCOUNTER — Telehealth: Payer: Self-pay | Admitting: Oncology

## 2018-11-25 NOTE — Telephone Encounter (Signed)
Called patient regarding upcoming Webex appointment, patient will let daughter help set this up. E-mail has been sent.

## 2018-11-28 ENCOUNTER — Inpatient Hospital Stay: Payer: Medicare Other | Attending: Oncology

## 2018-11-28 ENCOUNTER — Other Ambulatory Visit: Payer: Self-pay

## 2018-11-28 ENCOUNTER — Telehealth: Payer: Self-pay | Admitting: Oncology

## 2018-11-28 DIAGNOSIS — C7951 Secondary malignant neoplasm of bone: Secondary | ICD-10-CM | POA: Insufficient documentation

## 2018-11-28 DIAGNOSIS — C61 Malignant neoplasm of prostate: Secondary | ICD-10-CM | POA: Diagnosis not present

## 2018-11-28 LAB — CMP (CANCER CENTER ONLY)
ALT: 14 U/L (ref 0–44)
AST: 27 U/L (ref 15–41)
Albumin: 3.2 g/dL — ABNORMAL LOW (ref 3.5–5.0)
Alkaline Phosphatase: 114 U/L (ref 38–126)
Anion gap: 11 (ref 5–15)
BUN: 13 mg/dL (ref 8–23)
CO2: 23 mmol/L (ref 22–32)
Calcium: 9.1 mg/dL (ref 8.9–10.3)
Chloride: 98 mmol/L (ref 98–111)
Creatinine: 1.31 mg/dL — ABNORMAL HIGH (ref 0.61–1.24)
GFR, Est AFR Am: 58 mL/min — ABNORMAL LOW (ref >60)
GFR, Estimated: 50 mL/min — ABNORMAL LOW (ref >60)
Glucose, Bld: 123 mg/dL — ABNORMAL HIGH (ref 70–99)
Potassium: 4.3 mmol/L (ref 3.5–5.1)
Sodium: 132 mmol/L — ABNORMAL LOW (ref 135–145)
Total Bilirubin: 0.5 mg/dL (ref 0.3–1.2)
Total Protein: 6.3 g/dL — ABNORMAL LOW (ref 6.5–8.1)

## 2018-11-28 LAB — CBC WITH DIFFERENTIAL (CANCER CENTER ONLY)
Abs Immature Granulocytes: 0.03 10*3/uL (ref 0.00–0.07)
Basophils Absolute: 0 10*3/uL (ref 0.0–0.1)
Basophils Relative: 0 %
Eosinophils Absolute: 0 10*3/uL (ref 0.0–0.5)
Eosinophils Relative: 0 %
HCT: 30.1 % — ABNORMAL LOW (ref 39.0–52.0)
Hemoglobin: 9.9 g/dL — ABNORMAL LOW (ref 13.0–17.0)
Immature Granulocytes: 1 %
Lymphocytes Relative: 9 %
Lymphs Abs: 0.6 10*3/uL — ABNORMAL LOW (ref 0.7–4.0)
MCH: 29.3 pg (ref 26.0–34.0)
MCHC: 32.9 g/dL (ref 30.0–36.0)
MCV: 89.1 fL (ref 80.0–100.0)
Monocytes Absolute: 0.4 10*3/uL (ref 0.1–1.0)
Monocytes Relative: 6 %
Neutro Abs: 5.4 10*3/uL (ref 1.7–7.7)
Neutrophils Relative %: 84 %
Platelet Count: 180 10*3/uL (ref 150–400)
RBC: 3.38 MIL/uL — ABNORMAL LOW (ref 4.22–5.81)
RDW: 15.4 % (ref 11.5–15.5)
WBC Count: 6.4 10*3/uL (ref 4.0–10.5)
nRBC: 0 % (ref 0.0–0.2)

## 2018-11-28 NOTE — Telephone Encounter (Signed)
Contacted pt to verify webex for pre reg

## 2018-11-29 ENCOUNTER — Inpatient Hospital Stay (HOSPITAL_BASED_OUTPATIENT_CLINIC_OR_DEPARTMENT_OTHER): Payer: Medicare Other | Admitting: Oncology

## 2018-11-29 ENCOUNTER — Other Ambulatory Visit: Payer: Medicare Other

## 2018-11-29 DIAGNOSIS — C61 Malignant neoplasm of prostate: Secondary | ICD-10-CM | POA: Diagnosis not present

## 2018-11-29 LAB — PROSTATE-SPECIFIC AG, SERUM (LABCORP): Prostate Specific Ag, Serum: 599 ng/mL — ABNORMAL HIGH (ref 0.0–4.0)

## 2018-11-29 MED ORDER — MEGESTROL ACETATE 400 MG/10ML PO SUSP: 400.0000 mg | Freq: Two times a day (BID) | ORAL | 0 refills | Status: AC

## 2018-11-29 MED ORDER — OXYCODONE HCL 5 MG PO TABS: 5.0000 mg | ORAL_TABLET | ORAL | 0 refills | Status: AC | PRN

## 2018-11-29 NOTE — Progress Notes (Signed)
Hematology and Oncology Follow Up for Telemedicine Visits  Eric Deleon [id number] Oct 30, 1931 85 y.o. 11/29/2018 3:30 PM [name], MDPerini, [name], MD   I connected with Eric Deleon on 11/29/18 at  3:45 PM EDT by video enabled telemedicine visit and verified that I am speaking with the correct person using two identifiers.   I discussed the limitations, risks, security and privacy concerns of performing an evaluation and management service by telemedicine and the availability of in-person appointments. I also discussed with the patient that there may be a patient responsible charge related to this service. The patient expressed understanding and agreed to proceed.  Other persons participating in the visit and their role in the encounter:    Patient's location: Home Provider's location: Office    Principle Diagnosis: 85 year old man with advanced prostate cancer and disease to the bone diagnosed in 2013.  He has castration-resistant after presenting with Gleason score 4+3 equal 7 at that time.   Prior Therapy:  He received a definitive therapy with radiation therapy and developed biochemical relapse initially treated with androgen deprivation.  He developed bony metastasis including a frontal bone metastasis and was treated with radiation therapy in February of 2013. PSA was up to 13.38 despite castration levels of testosterone in 2015 indicating castration resistant disease. Xtandi 160 mg daily started in August of 2015.  Therapy discontinued in May 2019.  Current therapy:   Zytiga 1000 mg daily with prednisone at 5 mg daily.  Xofigo infusion started in December 2019.  He is status post 6 treatments completed in May 2020.    Interim History: Eric Deleon reports continuous decline in his overall health.  He has reported more weight loss and appetite changes and occasional nausea.  His pain has been manageable with the current pain regimen at this time which helps him with sleep.   His pain is rather diffuse in nature and has not improved after Xofigo.  He denies any recent falls or syncope.  He denies recent hospitalizations.    Medications: I have reviewed the patient's current medications.  Current Outpatient Medications  Medication Sig Dispense Refill  . allopurinol (ZYLOPRIM) 300 MG tablet Take 300 mg by mouth daily.  3  . atorvastatin (LIPITOR) 20 MG tablet Take 20 mg by mouth daily.  4  . LORazepam (ATIVAN) 1 MG tablet as directed.  1  . mirtazapine (REMERON) 7.5 MG tablet Take 1 tablet (7.5 mg total) by mouth at bedtime. 90 tablet 0  . omeprazole (PRILOSEC) 20 MG capsule Take 20 mg by mouth daily.    . ondansetron (ZOFRAN ODT) 4 MG disintegrating tablet Take 1 tablet (4 mg total) by mouth every 8 (eight) hours as needed for nausea or vomiting. 20 tablet 0  . oxyCODONE (OXY IR/ROXICODONE) 5 MG immediate release tablet Take 1 tablet (5 mg total) by mouth every 4 (four) hours as needed for severe pain. 90 tablet 0  . predniSONE (DELTASONE) 5 MG tablet TAKE 1 TABLET(5 MG) BY MOUTH DAILY WITH BREAKFAST 90 tablet 3  . prochlorperazine (COMPAZINE) 10 MG tablet TAKE 1 TABLET(10 MG) BY MOUTH EVERY 6 HOURS AS NEEDED FOR NAUSEA OR VOMITING 30 tablet 0  . promethazine (PHENERGAN) 12.5 MG tablet TAKE 1 TABLET(12.5 MG) BY MOUTH EVERY 6 HOURS AS NEEDED FOR NAUSEA 30 tablet 0  . simethicone (MYLICON) 80 MG chewable tablet Chew 80 mg by mouth every 6 (six) hours as needed.    . Tamsulosin HCl (FLOMAX) 0.4 MG CAPS Take by mouth.    Marland Kitchen  Vitamin D, Cholecalciferol, 400 UNITS CHEW Chew 800 Units by mouth daily.    Marland Kitchen ZYTIGA 250 MG tablet TAKE 4 TABLETS (1,000 MG TOTAL) BY MOUTH DAILY. TAKE ON AN EMPTY STOMACH 1 HOUR BEFORE OR 2 HOURS AFTER A MEAL 120 tablet 10   No current facility-administered medications for this visit.      Allergies: No Known Allergies  Past Medical History, Surgical history, Social history, and Family History were reviewed and updated.      Lab  Results: Lab Results  Component Value Date   WBC 6.4 11/28/2018   HGB 9.9 (L) 11/28/2018   HCT 30.1 (L) 11/28/2018   MCV 89.1 11/28/2018   PLT 180 11/28/2018     Chemistry      Component Value Date/Time   NA 132 (L) 11/28/2018 1423   NA 136 04/28/2017 1123   K 4.3 11/28/2018 1423   K 4.6 04/28/2017 1123   CL 98 11/28/2018 1423   CO2 23 11/28/2018 1423   CO2 25 04/28/2017 1123   BUN 13 11/28/2018 1423   BUN 24.1 04/28/2017 1123   CREATININE 1.31 (H) 11/28/2018 1423   CREATININE 1.8 (H) 04/28/2017 1123      Component Value Date/Time   CALCIUM 9.1 11/28/2018 1423   CALCIUM 9.2 04/28/2017 1123   ALKPHOS 114 11/28/2018 1423   ALKPHOS 81 04/28/2017 1123   AST 27 11/28/2018 1423   AST 14 04/28/2017 1123   ALT 14 11/28/2018 1423   ALT 10 04/28/2017 1123   BILITOT 0.5 11/28/2018 1423   BILITOT 0.36 04/28/2017 1123       Impression and Plan:  85 year old with:  1.    Castration-resistant prostate cancer with disease to the bone diagnosed in 2013.    He is currently on Zytiga also completed 6 months of Xofigo at this time.  His PSA continues to rise without any major clinical benefit noted as of late.  The natural course of his disease was reviewed today and alternative treatment options were also discussed.  He is options moving forward predominantly would be systemic chemotherapy in the form of Taxotere.  Complication associated with this treatment, nausea, vomiting, myelosuppression, fatigue and worsening neuropathy.  Alternative approach would be conservative management and supportive care only.   After discussion today, he opted to proceed with supportive care only.  We also discussed the role of hospice which would be involved if needed in the future.   2. Androgen depravation:  I recommended permanent discontinuation at this time.   4.  Weight loss: Prescription for Megace will be available to him at this time.  5.  Pain: Manageable with oxycodone which  will be refilled for him.  6.  Prognosis: Poor with limited life expectancy related to advanced prostate cancer anticipated less than 6 months.  7. Followup: Will be in 6 weeks for repeat evaluation.     I discussed the assessment and treatment plan with the patient. The patient was provided an opportunity to ask questions and all were answered. The patient agreed with the plan and demonstrated an understanding of the instructions.   The patient was advised to call back or seek an in-person evaluation if the symptoms worsen or if the condition fails to improve as anticipated.  I provided  minutes of non face-to-face telephone visit time during this encounter, and > 50% was spent on reviewing his disease status, treatment options and addressing complications related to future therapies.  Zola Button, MD 11/29/2018 3:30 PM

## 2018-11-30 ENCOUNTER — Telehealth: Payer: Self-pay | Admitting: Oncology

## 2018-11-30 NOTE — Telephone Encounter (Signed)
No 6/16 los  °

## 2018-12-06 DIAGNOSIS — C801 Malignant (primary) neoplasm, unspecified: Secondary | ICD-10-CM | POA: Diagnosis not present

## 2018-12-06 DIAGNOSIS — M109 Gout, unspecified: Secondary | ICD-10-CM | POA: Diagnosis not present

## 2018-12-06 DIAGNOSIS — Z1331 Encounter for screening for depression: Secondary | ICD-10-CM | POA: Diagnosis not present

## 2018-12-06 DIAGNOSIS — K219 Gastro-esophageal reflux disease without esophagitis: Secondary | ICD-10-CM | POA: Diagnosis not present

## 2018-12-06 DIAGNOSIS — N189 Chronic kidney disease, unspecified: Secondary | ICD-10-CM | POA: Diagnosis not present

## 2018-12-06 DIAGNOSIS — C61 Malignant neoplasm of prostate: Secondary | ICD-10-CM | POA: Diagnosis not present

## 2018-12-06 DIAGNOSIS — L989 Disorder of the skin and subcutaneous tissue, unspecified: Secondary | ICD-10-CM | POA: Diagnosis not present

## 2018-12-06 DIAGNOSIS — K1120 Sialoadenitis, unspecified: Secondary | ICD-10-CM | POA: Diagnosis not present

## 2018-12-06 NOTE — Telephone Encounter (Signed)
Oral Oncology Patient Advocate Encounter  Eric Deleon has been discontinued.  I called [name] and had his application inactivated.  [place] [facility] Phone [phone] Fax [phone] 12/06/2018   10:44 AM

## 2018-12-07 DIAGNOSIS — R531 Weakness: Secondary | ICD-10-CM | POA: Diagnosis not present

## 2018-12-07 DIAGNOSIS — R630 Anorexia: Secondary | ICD-10-CM | POA: Diagnosis not present

## 2018-12-07 DIAGNOSIS — M109 Gout, unspecified: Secondary | ICD-10-CM | POA: Diagnosis not present

## 2018-12-07 DIAGNOSIS — R52 Pain, unspecified: Secondary | ICD-10-CM | POA: Diagnosis not present

## 2018-12-07 DIAGNOSIS — C61 Malignant neoplasm of prostate: Secondary | ICD-10-CM | POA: Diagnosis not present

## 2018-12-07 DIAGNOSIS — R110 Nausea: Secondary | ICD-10-CM | POA: Diagnosis not present

## 2018-12-08 DIAGNOSIS — R110 Nausea: Secondary | ICD-10-CM | POA: Diagnosis not present

## 2018-12-08 DIAGNOSIS — C61 Malignant neoplasm of prostate: Secondary | ICD-10-CM | POA: Diagnosis not present

## 2018-12-08 DIAGNOSIS — R52 Pain, unspecified: Secondary | ICD-10-CM | POA: Diagnosis not present

## 2018-12-08 DIAGNOSIS — M109 Gout, unspecified: Secondary | ICD-10-CM | POA: Diagnosis not present

## 2018-12-08 DIAGNOSIS — R630 Anorexia: Secondary | ICD-10-CM | POA: Diagnosis not present

## 2018-12-08 DIAGNOSIS — R531 Weakness: Secondary | ICD-10-CM | POA: Diagnosis not present

## 2018-12-09 DIAGNOSIS — M109 Gout, unspecified: Secondary | ICD-10-CM | POA: Diagnosis not present

## 2018-12-09 DIAGNOSIS — C61 Malignant neoplasm of prostate: Secondary | ICD-10-CM | POA: Diagnosis not present

## 2018-12-09 DIAGNOSIS — R531 Weakness: Secondary | ICD-10-CM | POA: Diagnosis not present

## 2018-12-09 DIAGNOSIS — R630 Anorexia: Secondary | ICD-10-CM | POA: Diagnosis not present

## 2018-12-09 DIAGNOSIS — R52 Pain, unspecified: Secondary | ICD-10-CM | POA: Diagnosis not present

## 2018-12-09 DIAGNOSIS — R110 Nausea: Secondary | ICD-10-CM | POA: Diagnosis not present

## 2018-12-14 DIAGNOSIS — R110 Nausea: Secondary | ICD-10-CM | POA: Diagnosis not present

## 2018-12-14 DIAGNOSIS — C61 Malignant neoplasm of prostate: Secondary | ICD-10-CM | POA: Diagnosis not present

## 2018-12-14 DIAGNOSIS — R52 Pain, unspecified: Secondary | ICD-10-CM | POA: Diagnosis not present

## 2018-12-14 DIAGNOSIS — R630 Anorexia: Secondary | ICD-10-CM | POA: Diagnosis not present

## 2018-12-14 DIAGNOSIS — M109 Gout, unspecified: Secondary | ICD-10-CM | POA: Diagnosis not present

## 2018-12-14 DIAGNOSIS — R531 Weakness: Secondary | ICD-10-CM | POA: Diagnosis not present

## 2018-12-15 DIAGNOSIS — L720 Epidermal cyst: Secondary | ICD-10-CM | POA: Diagnosis not present

## 2018-12-19 DIAGNOSIS — R630 Anorexia: Secondary | ICD-10-CM | POA: Diagnosis not present

## 2018-12-19 DIAGNOSIS — R110 Nausea: Secondary | ICD-10-CM | POA: Diagnosis not present

## 2018-12-19 DIAGNOSIS — R52 Pain, unspecified: Secondary | ICD-10-CM | POA: Diagnosis not present

## 2018-12-19 DIAGNOSIS — R531 Weakness: Secondary | ICD-10-CM | POA: Diagnosis not present

## 2018-12-19 DIAGNOSIS — C61 Malignant neoplasm of prostate: Secondary | ICD-10-CM | POA: Diagnosis not present

## 2018-12-19 DIAGNOSIS — M109 Gout, unspecified: Secondary | ICD-10-CM | POA: Diagnosis not present

## 2018-12-20 ENCOUNTER — Other Ambulatory Visit: Payer: Self-pay

## 2018-12-20 ENCOUNTER — Ambulatory Visit: Payer: Self-pay | Admitting: Otolaryngology

## 2018-12-20 ENCOUNTER — Other Ambulatory Visit (HOSPITAL_COMMUNITY): Admission: RE | Admit: 2018-12-20 | Payer: Medicare Other | Source: Ambulatory Visit

## 2018-12-20 NOTE — Progress Notes (Signed)
I called patient for PAT info and his wife Pamala Hurry answered the phone. She states pt is home with hospice care for prostate Ca with mets to bone. She states that pt has been mostly unresponsive today and has not eaten or taken his meds. She did not know if pt could even get tested for covid tomorrow. She finally ended up saying that "maybe we should put off surgery on Friday" I called Dr Pollie Friar office and spoke with Gay Filler to let Dr Lucia Gaskins know that pt will need to be rescheduled.

## 2018-12-20 NOTE — H&P (Signed)
PREOPERATIVE H&P  Chief Complaint: chronic right neck sore  HPI: Eric Deleon is a 85 y.o. male who presents for evaluation of  Chronic right neck sore that has been present for over 2 months. Patient has history of metastatic prostate cancer and received chemotherapy. He's had a chronic sore of the right upper neck just below the jaw line for over 2 months. He is referred here for excision of the lesion. Biopsy in the office was more consistent with inflammatory lesion versus neoplastic lesion. He's taken operating room for excision and closure under local anesthetic.  Past Medical History:  Diagnosis Date  . Allergy    allergic rhinitis  . Arthritis    finger  . BPH (benign prostatic hyperplasia)   . Cancer ([facility])    "skin cancer"  . CRI (chronic renal insufficiency)    mild asymptomatic for years  . Erectile dysfunction   . GERD (gastroesophageal reflux disease)   . Gout   . History of pneumonia    when in the [employer]  . IFG (impaired fasting glucose)   . Prostate cancer ([place])   . Skin cancer    non melanoma   Past Surgical History:  Procedure Laterality Date  . history of radiation therapy    . skin cancer excised     Multiple skin cancers excised from hands and back   Social History   Socioeconomic History  . Marital status: Married    Spouse name: Not on file  . Number of children: Not on file  . Years of education: Not on file  . Highest education level: Not on file  Occupational History  . Not on file  Social Needs  . Financial resource strain: Not on file  . Food insecurity    Worry: Not on file    Inability: Not on file  . Transportation needs    Medical: Not on file    Non-medical: Not on file  Tobacco Use  . Smoking status: Former Smoker    Packs/day: 0.50    Years: 15.00    Pack years: 7.50    Types: Cigarettes    Quit date: 06/30/1960    Years since quitting: 58.5  . Smokeless tobacco: Never Used  Substance and Sexual Activity  . Alcohol use:  Yes    Comment: occasionally  red wine  and beer   . Drug use: No  . Sexual activity: Not Currently  Lifestyle  . Physical activity    Days per week: Not on file    Minutes per session: Not on file  . Stress: Not on file  Relationships  . Social Herbalist on phone: Not on file    Gets together: Not on file    Attends religious service: Not on file    Active member of club or organization: Not on file    Attends meetings of clubs or organizations: Not on file    Relationship status: Not on file  Other Topics Concern  . Not on file  Social History Narrative  . Not on file   Family History  Problem Relation Age of Onset  . COPD Mother   . Heart disease Mother   . Heart attack Father   . Prostate cancer Father   . Cancer Father   . Cancer Brother   . Prostate cancer Brother    No Known Allergies Prior to Admission medications   Medication Sig Start Date End Date Taking? Authorizing Provider  allopurinol (ZYLOPRIM)  300 MG tablet Take 300 mg by mouth daily. 06/01/14   [provider]  atorvastatin (LIPITOR) 20 MG tablet Take 20 mg by mouth daily. 05/09/14   [provider]  LORazepam (ATIVAN) 1 MG tablet as directed. 07/10/14   [provider]  megestrol (MEGACE) 400 MG/10ML suspension Take 10 mLs (400 mg total) by mouth 2 (two) times daily. 11/29/18   Wyatt Portela, MD  mirtazapine (REMERON) 7.5 MG tablet Take 1 tablet (7.5 mg total) by mouth at bedtime. 07/11/18   Wyatt Portela, MD  omeprazole (PRILOSEC) 20 MG capsule Take 20 mg by mouth daily.    [provider]  ondansetron (ZOFRAN ODT) 4 MG disintegrating tablet Take 1 tablet (4 mg total) by mouth every 8 (eight) hours as needed for nausea or vomiting. 08/16/18   Wyatt Portela, MD  oxyCODONE (OXY IR/ROXICODONE) 5 MG immediate release tablet Take 1 tablet (5 mg total) by mouth every 4 (four) hours as needed for severe pain. 11/29/18   Wyatt Portela, MD  predniSONE (DELTASONE) 5  MG tablet TAKE 1 TABLET(5 MG) BY MOUTH DAILY WITH BREAKFAST 10/11/18   Wyatt Portela, MD  prochlorperazine (COMPAZINE) 10 MG tablet TAKE 1 TABLET(10 MG) BY MOUTH EVERY 6 HOURS AS NEEDED FOR NAUSEA OR VOMITING 11/14/18   Wyatt Portela, MD  promethazine (PHENERGAN) 12.5 MG tablet TAKE 1 TABLET(12.5 MG) BY MOUTH EVERY 6 HOURS AS NEEDED FOR NAUSEA 05/04/18   Wyatt Portela, MD  simethicone (MYLICON) 80 MG chewable tablet Chew 80 mg by mouth every 6 (six) hours as needed.    [provider]  Tamsulosin HCl (FLOMAX) 0.4 MG CAPS Take by mouth.    [provider]  Vitamin D, Cholecalciferol, 400 UNITS CHEW Chew 800 Units by mouth daily.    [provider]  ZYTIGA 250 MG tablet TAKE 4 TABLETS (1,000 MG TOTAL) BY MOUTH DAILY. TAKE ON AN EMPTY STOMACH 1 HOUR BEFORE OR 2 HOURS AFTER A MEAL 10/19/18   Wyatt Portela, MD     Positive ROS: negative  All other systems have been reviewed and were otherwise negative with the exception of those mentioned in the HPI and as above.  Physical Exam: There were no vitals filed for this visit.  General: Alert, no acute distress Oral: Normal oral mucosa and tonsils Nasal: Clear nasal passages Neck: No palpable adenopathy or thyroid nodules. Patient with an exophytic 2-2.5 cm right neck mass just inferior to the mandible Ear: Ear canal is clear with normal appearing TMs Cardiovascular: Regular rate and rhythm, no murmur.  Respiratory: Clear to auscultation Neurologic: Alert and oriented x 3   Assessment/Plan: Right neck lesion  Plan for Excision of right neck lesion with primary closure.   Melony Overly, MD 12/20/2018 1:18 PM

## 2018-12-21 ENCOUNTER — Inpatient Hospital Stay (HOSPITAL_COMMUNITY): Admission: RE | Admit: 2018-12-21 | Payer: Medicare Other | Source: Ambulatory Visit

## 2018-12-21 NOTE — Progress Notes (Signed)
I spoke with Mr Arrighi wife Pamala Hurry today due to "no show" for Covid 19 screening. Mrs Stclair stated due to Mr Wickham condition, she was going to have to reschedule his procedure for a later date.

## 2018-12-23 ENCOUNTER — Encounter (HOSPITAL_BASED_OUTPATIENT_CLINIC_OR_DEPARTMENT_OTHER): Admission: RE | Payer: Self-pay | Source: Home / Self Care

## 2018-12-23 ENCOUNTER — Ambulatory Visit (HOSPITAL_BASED_OUTPATIENT_CLINIC_OR_DEPARTMENT_OTHER): Admission: RE | Admit: 2018-12-23 | Payer: Medicare Other | Source: Home / Self Care | Admitting: Otolaryngology

## 2018-12-23 SURGERY — EXCISION MASS
Anesthesia: Monitor Anesthesia Care | Laterality: Right

## 2018-12-26 DIAGNOSIS — R531 Weakness: Secondary | ICD-10-CM | POA: Diagnosis not present

## 2018-12-26 DIAGNOSIS — M109 Gout, unspecified: Secondary | ICD-10-CM | POA: Diagnosis not present

## 2018-12-26 DIAGNOSIS — R52 Pain, unspecified: Secondary | ICD-10-CM | POA: Diagnosis not present

## 2018-12-26 DIAGNOSIS — R110 Nausea: Secondary | ICD-10-CM | POA: Diagnosis not present

## 2018-12-26 DIAGNOSIS — C61 Malignant neoplasm of prostate: Secondary | ICD-10-CM | POA: Diagnosis not present

## 2018-12-26 DIAGNOSIS — R630 Anorexia: Secondary | ICD-10-CM | POA: Diagnosis not present

## 2018-12-27 DIAGNOSIS — M109 Gout, unspecified: Secondary | ICD-10-CM | POA: Diagnosis not present

## 2018-12-27 DIAGNOSIS — R630 Anorexia: Secondary | ICD-10-CM | POA: Diagnosis not present

## 2018-12-27 DIAGNOSIS — C61 Malignant neoplasm of prostate: Secondary | ICD-10-CM | POA: Diagnosis not present

## 2018-12-27 DIAGNOSIS — R531 Weakness: Secondary | ICD-10-CM | POA: Diagnosis not present

## 2018-12-27 DIAGNOSIS — R52 Pain, unspecified: Secondary | ICD-10-CM | POA: Diagnosis not present

## 2018-12-27 DIAGNOSIS — R110 Nausea: Secondary | ICD-10-CM | POA: Diagnosis not present

## 2018-12-28 DIAGNOSIS — R110 Nausea: Secondary | ICD-10-CM | POA: Diagnosis not present

## 2018-12-28 DIAGNOSIS — R630 Anorexia: Secondary | ICD-10-CM | POA: Diagnosis not present

## 2018-12-28 DIAGNOSIS — C61 Malignant neoplasm of prostate: Secondary | ICD-10-CM | POA: Diagnosis not present

## 2018-12-28 DIAGNOSIS — M109 Gout, unspecified: Secondary | ICD-10-CM | POA: Diagnosis not present

## 2018-12-28 DIAGNOSIS — R531 Weakness: Secondary | ICD-10-CM | POA: Diagnosis not present

## 2018-12-28 DIAGNOSIS — R52 Pain, unspecified: Secondary | ICD-10-CM | POA: Diagnosis not present

## 2019-01-02 DIAGNOSIS — C61 Malignant neoplasm of prostate: Secondary | ICD-10-CM | POA: Diagnosis not present

## 2019-01-02 DIAGNOSIS — R531 Weakness: Secondary | ICD-10-CM | POA: Diagnosis not present

## 2019-01-02 DIAGNOSIS — R630 Anorexia: Secondary | ICD-10-CM | POA: Diagnosis not present

## 2019-01-02 DIAGNOSIS — R52 Pain, unspecified: Secondary | ICD-10-CM | POA: Diagnosis not present

## 2019-01-02 DIAGNOSIS — R110 Nausea: Secondary | ICD-10-CM | POA: Diagnosis not present

## 2019-01-02 DIAGNOSIS — M109 Gout, unspecified: Secondary | ICD-10-CM | POA: Diagnosis not present

## 2019-01-07 DIAGNOSIS — R630 Anorexia: Secondary | ICD-10-CM | POA: Diagnosis not present

## 2019-01-07 DIAGNOSIS — M109 Gout, unspecified: Secondary | ICD-10-CM | POA: Diagnosis not present

## 2019-01-07 DIAGNOSIS — R110 Nausea: Secondary | ICD-10-CM | POA: Diagnosis not present

## 2019-01-07 DIAGNOSIS — C61 Malignant neoplasm of prostate: Secondary | ICD-10-CM | POA: Diagnosis not present

## 2019-01-07 DIAGNOSIS — R52 Pain, unspecified: Secondary | ICD-10-CM | POA: Diagnosis not present

## 2019-01-07 DIAGNOSIS — R531 Weakness: Secondary | ICD-10-CM | POA: Diagnosis not present

## 2019-01-08 DIAGNOSIS — R630 Anorexia: Secondary | ICD-10-CM | POA: Diagnosis not present

## 2019-01-08 DIAGNOSIS — M109 Gout, unspecified: Secondary | ICD-10-CM | POA: Diagnosis not present

## 2019-01-08 DIAGNOSIS — R110 Nausea: Secondary | ICD-10-CM | POA: Diagnosis not present

## 2019-01-08 DIAGNOSIS — R531 Weakness: Secondary | ICD-10-CM | POA: Diagnosis not present

## 2019-01-08 DIAGNOSIS — C61 Malignant neoplasm of prostate: Secondary | ICD-10-CM | POA: Diagnosis not present

## 2019-01-08 DIAGNOSIS — R52 Pain, unspecified: Secondary | ICD-10-CM | POA: Diagnosis not present

## 2019-01-09 DIAGNOSIS — C61 Malignant neoplasm of prostate: Secondary | ICD-10-CM | POA: Diagnosis not present

## 2019-01-09 DIAGNOSIS — M109 Gout, unspecified: Secondary | ICD-10-CM | POA: Diagnosis not present

## 2019-01-09 DIAGNOSIS — R531 Weakness: Secondary | ICD-10-CM | POA: Diagnosis not present

## 2019-01-09 DIAGNOSIS — R630 Anorexia: Secondary | ICD-10-CM | POA: Diagnosis not present

## 2019-01-09 DIAGNOSIS — R52 Pain, unspecified: Secondary | ICD-10-CM | POA: Diagnosis not present

## 2019-01-09 DIAGNOSIS — R110 Nausea: Secondary | ICD-10-CM | POA: Diagnosis not present

## 2019-01-10 DIAGNOSIS — R630 Anorexia: Secondary | ICD-10-CM | POA: Diagnosis not present

## 2019-01-10 DIAGNOSIS — M109 Gout, unspecified: Secondary | ICD-10-CM | POA: Diagnosis not present

## 2019-01-10 DIAGNOSIS — R52 Pain, unspecified: Secondary | ICD-10-CM | POA: Diagnosis not present

## 2019-01-10 DIAGNOSIS — R531 Weakness: Secondary | ICD-10-CM | POA: Diagnosis not present

## 2019-01-10 DIAGNOSIS — C61 Malignant neoplasm of prostate: Secondary | ICD-10-CM | POA: Diagnosis not present

## 2019-01-10 DIAGNOSIS — R110 Nausea: Secondary | ICD-10-CM | POA: Diagnosis not present

## 2019-01-11 DIAGNOSIS — R110 Nausea: Secondary | ICD-10-CM | POA: Diagnosis not present

## 2019-01-11 DIAGNOSIS — R52 Pain, unspecified: Secondary | ICD-10-CM | POA: Diagnosis not present

## 2019-01-11 DIAGNOSIS — R531 Weakness: Secondary | ICD-10-CM | POA: Diagnosis not present

## 2019-01-11 DIAGNOSIS — M109 Gout, unspecified: Secondary | ICD-10-CM | POA: Diagnosis not present

## 2019-01-11 DIAGNOSIS — R630 Anorexia: Secondary | ICD-10-CM | POA: Diagnosis not present

## 2019-01-11 DIAGNOSIS — C61 Malignant neoplasm of prostate: Secondary | ICD-10-CM | POA: Diagnosis not present

## 2019-01-12 DIAGNOSIS — R531 Weakness: Secondary | ICD-10-CM | POA: Diagnosis not present

## 2019-01-12 DIAGNOSIS — M109 Gout, unspecified: Secondary | ICD-10-CM | POA: Diagnosis not present

## 2019-01-12 DIAGNOSIS — C61 Malignant neoplasm of prostate: Secondary | ICD-10-CM | POA: Diagnosis not present

## 2019-01-12 DIAGNOSIS — R630 Anorexia: Secondary | ICD-10-CM | POA: Diagnosis not present

## 2019-01-12 DIAGNOSIS — R52 Pain, unspecified: Secondary | ICD-10-CM | POA: Diagnosis not present

## 2019-01-12 DIAGNOSIS — R110 Nausea: Secondary | ICD-10-CM | POA: Diagnosis not present

## 2019-01-13 DIAGNOSIS — M109 Gout, unspecified: Secondary | ICD-10-CM | POA: Diagnosis not present

## 2019-01-13 DIAGNOSIS — R630 Anorexia: Secondary | ICD-10-CM | POA: Diagnosis not present

## 2019-01-13 DIAGNOSIS — C61 Malignant neoplasm of prostate: Secondary | ICD-10-CM | POA: Diagnosis not present

## 2019-01-13 DIAGNOSIS — R110 Nausea: Secondary | ICD-10-CM | POA: Diagnosis not present

## 2019-01-13 DIAGNOSIS — R531 Weakness: Secondary | ICD-10-CM | POA: Diagnosis not present

## 2019-01-13 DIAGNOSIS — R52 Pain, unspecified: Secondary | ICD-10-CM | POA: Diagnosis not present

## 2019-01-14 DEATH — deceased

## 2019-01-16 ENCOUNTER — Telehealth: Payer: Self-pay | Admitting: *Deleted

## 2019-01-16 NOTE — Telephone Encounter (Signed)
Eric Deleon faxed Prior authorization request for Oxycodone 5 mg tab.  Request to [facility] letter tray receptacle of Prior Authorization forms for review.

## 2019-01-17 ENCOUNTER — Telehealth: Payer: Self-pay | Admitting: Oncology

## 2019-01-17 NOTE — Telephone Encounter (Signed)
Informed patient has been deceased, appointment is cancelled.

## 2019-01-18 ENCOUNTER — Inpatient Hospital Stay: Payer: BLUE CROSS/BLUE SHIELD | Admitting: Oncology
